# Patient Record
Sex: Female | Born: 1940 | Race: White | Hispanic: No | Smoking: Former smoker
Health system: Southern US, Community
[De-identification: ages and names within clinical notes are randomized; demographics above are authoritative.]

## PROBLEM LIST (undated history)

## (undated) DIAGNOSIS — F32A Depression, unspecified: Secondary | ICD-10-CM

## (undated) DIAGNOSIS — S42001A Fracture of unspecified part of right clavicle, initial encounter for closed fracture: Secondary | ICD-10-CM

## (undated) DIAGNOSIS — H409 Unspecified glaucoma: Secondary | ICD-10-CM

## (undated) DIAGNOSIS — D869 Sarcoidosis, unspecified: Secondary | ICD-10-CM

## (undated) DIAGNOSIS — C801 Malignant (primary) neoplasm, unspecified: Secondary | ICD-10-CM

## (undated) DIAGNOSIS — Z9109 Other allergy status, other than to drugs and biological substances: Secondary | ICD-10-CM

## (undated) DIAGNOSIS — I10 Essential (primary) hypertension: Secondary | ICD-10-CM

## (undated) DIAGNOSIS — T7840XA Allergy, unspecified, initial encounter: Secondary | ICD-10-CM

## (undated) DIAGNOSIS — M1990 Unspecified osteoarthritis, unspecified site: Secondary | ICD-10-CM

## (undated) DIAGNOSIS — E119 Type 2 diabetes mellitus without complications: Secondary | ICD-10-CM

## (undated) DIAGNOSIS — J45909 Unspecified asthma, uncomplicated: Secondary | ICD-10-CM

## (undated) DIAGNOSIS — E785 Hyperlipidemia, unspecified: Secondary | ICD-10-CM

## (undated) DIAGNOSIS — Z87442 Personal history of urinary calculi: Secondary | ICD-10-CM

## (undated) DIAGNOSIS — F419 Anxiety disorder, unspecified: Secondary | ICD-10-CM

## (undated) HISTORY — DX: Unspecified glaucoma: H40.9

## (undated) HISTORY — DX: Hyperlipidemia, unspecified: E78.5

## (undated) HISTORY — DX: Depression, unspecified: F32.A

## (undated) HISTORY — DX: Allergy, unspecified, initial encounter: T78.40XA

## (undated) HISTORY — DX: Anxiety disorder, unspecified: F41.9

## (undated) HISTORY — PX: EYE SURGERY: SHX253

## (undated) HISTORY — PX: BRONCHOSCOPY: SUR163

## (undated) HISTORY — PX: FRACTURE SURGERY: SHX138

## (undated) HISTORY — PX: COLONOSCOPY W/ POLYPECTOMY: SHX1380

---

## 1997-11-15 ENCOUNTER — Other Ambulatory Visit: Admission: RE | Admit: 1997-11-15 | Discharge: 1997-11-15 | Payer: Self-pay | Admitting: Urology

## 1998-02-12 ENCOUNTER — Other Ambulatory Visit: Admission: RE | Admit: 1998-02-12 | Discharge: 1998-02-12 | Payer: Self-pay | Admitting: Urology

## 1998-02-12 ENCOUNTER — Ambulatory Visit (HOSPITAL_COMMUNITY): Admission: RE | Admit: 1998-02-12 | Discharge: 1998-02-12 | Payer: Self-pay | Admitting: Urology

## 1998-08-13 ENCOUNTER — Other Ambulatory Visit: Admission: RE | Admit: 1998-08-13 | Discharge: 1998-08-13 | Payer: Self-pay | Admitting: Urology

## 1999-06-05 ENCOUNTER — Other Ambulatory Visit: Admission: RE | Admit: 1999-06-05 | Discharge: 1999-06-05 | Payer: Self-pay | Admitting: Urology

## 2000-10-22 ENCOUNTER — Other Ambulatory Visit: Admission: RE | Admit: 2000-10-22 | Discharge: 2000-10-22 | Payer: Self-pay | Admitting: Family Medicine

## 2001-04-09 ENCOUNTER — Ambulatory Visit (HOSPITAL_COMMUNITY): Admission: RE | Admit: 2001-04-09 | Discharge: 2001-04-09 | Payer: Self-pay | Admitting: Gastroenterology

## 2001-10-22 ENCOUNTER — Other Ambulatory Visit: Admission: RE | Admit: 2001-10-22 | Discharge: 2001-10-22 | Payer: Self-pay | Admitting: Family Medicine

## 2002-10-25 ENCOUNTER — Other Ambulatory Visit: Admission: RE | Admit: 2002-10-25 | Discharge: 2002-10-25 | Payer: Self-pay | Admitting: Family Medicine

## 2003-10-26 ENCOUNTER — Other Ambulatory Visit: Admission: RE | Admit: 2003-10-26 | Discharge: 2003-10-26 | Payer: Self-pay | Admitting: Family Medicine

## 2003-11-23 ENCOUNTER — Encounter: Admission: RE | Admit: 2003-11-23 | Discharge: 2004-02-21 | Payer: Self-pay | Admitting: Family Medicine

## 2005-01-24 ENCOUNTER — Other Ambulatory Visit: Admission: RE | Admit: 2005-01-24 | Discharge: 2005-01-24 | Payer: Self-pay | Admitting: Family Medicine

## 2012-07-05 ENCOUNTER — Other Ambulatory Visit: Payer: Self-pay | Admitting: Obstetrics and Gynecology

## 2014-07-11 ENCOUNTER — Other Ambulatory Visit: Payer: Self-pay | Admitting: Obstetrics and Gynecology

## 2014-07-13 LAB — CYTOLOGY - PAP

## 2015-10-03 ENCOUNTER — Other Ambulatory Visit: Payer: Self-pay | Admitting: Nephrology

## 2015-10-03 DIAGNOSIS — R7989 Other specified abnormal findings of blood chemistry: Secondary | ICD-10-CM

## 2015-10-04 ENCOUNTER — Ambulatory Visit
Admission: RE | Admit: 2015-10-04 | Discharge: 2015-10-04 | Disposition: A | Discharge status: Home / Self Care | Payer: Medicare Other | Source: Ambulatory Visit | Attending: Nephrology | Admitting: Nephrology

## 2015-10-04 DIAGNOSIS — R7989 Other specified abnormal findings of blood chemistry: Secondary | ICD-10-CM

## 2016-07-24 DIAGNOSIS — H31009 Unspecified chorioretinal scars, unspecified eye: Secondary | ICD-10-CM | POA: Diagnosis not present

## 2016-07-24 DIAGNOSIS — H3562 Retinal hemorrhage, left eye: Secondary | ICD-10-CM | POA: Diagnosis not present

## 2016-07-24 DIAGNOSIS — H401122 Primary open-angle glaucoma, left eye, moderate stage: Secondary | ICD-10-CM | POA: Diagnosis not present

## 2016-07-24 DIAGNOSIS — D8689 Sarcoidosis of other sites: Secondary | ICD-10-CM | POA: Diagnosis not present

## 2016-07-30 DIAGNOSIS — H401122 Primary open-angle glaucoma, left eye, moderate stage: Secondary | ICD-10-CM | POA: Diagnosis not present

## 2016-08-05 DIAGNOSIS — Z6824 Body mass index (BMI) 24.0-24.9, adult: Secondary | ICD-10-CM | POA: Diagnosis not present

## 2016-08-05 DIAGNOSIS — Z130 Encounter for screening for diseases of the blood and blood-forming organs and certain disorders involving the immune mechanism: Secondary | ICD-10-CM | POA: Diagnosis not present

## 2016-08-05 DIAGNOSIS — Z01419 Encounter for gynecological examination (general) (routine) without abnormal findings: Secondary | ICD-10-CM | POA: Diagnosis not present

## 2016-08-05 DIAGNOSIS — Z136 Encounter for screening for cardiovascular disorders: Secondary | ICD-10-CM | POA: Diagnosis not present

## 2016-08-05 DIAGNOSIS — Z124 Encounter for screening for malignant neoplasm of cervix: Secondary | ICD-10-CM | POA: Diagnosis not present

## 2016-08-05 DIAGNOSIS — E559 Vitamin D deficiency, unspecified: Secondary | ICD-10-CM | POA: Diagnosis not present

## 2016-08-05 DIAGNOSIS — E1165 Type 2 diabetes mellitus with hyperglycemia: Secondary | ICD-10-CM | POA: Diagnosis not present

## 2016-10-02 DIAGNOSIS — H524 Presbyopia: Secondary | ICD-10-CM | POA: Diagnosis not present

## 2016-10-02 DIAGNOSIS — E119 Type 2 diabetes mellitus without complications: Secondary | ICD-10-CM | POA: Diagnosis not present

## 2016-10-02 DIAGNOSIS — H5203 Hypermetropia, bilateral: Secondary | ICD-10-CM | POA: Diagnosis not present

## 2016-10-02 DIAGNOSIS — Z961 Presence of intraocular lens: Secondary | ICD-10-CM | POA: Diagnosis not present

## 2017-01-15 DIAGNOSIS — H18002 Unspecified corneal deposit, left eye: Secondary | ICD-10-CM | POA: Diagnosis not present

## 2017-01-15 DIAGNOSIS — H3562 Retinal hemorrhage, left eye: Secondary | ICD-10-CM | POA: Diagnosis not present

## 2017-01-15 DIAGNOSIS — H401122 Primary open-angle glaucoma, left eye, moderate stage: Secondary | ICD-10-CM | POA: Diagnosis not present

## 2017-01-15 DIAGNOSIS — H2012 Chronic iridocyclitis, left eye: Secondary | ICD-10-CM | POA: Diagnosis not present

## 2017-01-23 DIAGNOSIS — H3562 Retinal hemorrhage, left eye: Secondary | ICD-10-CM | POA: Diagnosis not present

## 2017-01-23 DIAGNOSIS — H18002 Unspecified corneal deposit, left eye: Secondary | ICD-10-CM | POA: Diagnosis not present

## 2017-01-23 DIAGNOSIS — H401122 Primary open-angle glaucoma, left eye, moderate stage: Secondary | ICD-10-CM | POA: Diagnosis not present

## 2017-01-23 DIAGNOSIS — H2012 Chronic iridocyclitis, left eye: Secondary | ICD-10-CM | POA: Diagnosis not present

## 2017-02-02 DIAGNOSIS — E119 Type 2 diabetes mellitus without complications: Secondary | ICD-10-CM | POA: Diagnosis not present

## 2017-02-10 DIAGNOSIS — H18002 Unspecified corneal deposit, left eye: Secondary | ICD-10-CM | POA: Diagnosis not present

## 2017-02-10 DIAGNOSIS — H3562 Retinal hemorrhage, left eye: Secondary | ICD-10-CM | POA: Diagnosis not present

## 2017-02-10 DIAGNOSIS — H2012 Chronic iridocyclitis, left eye: Secondary | ICD-10-CM | POA: Diagnosis not present

## 2017-02-10 DIAGNOSIS — H401122 Primary open-angle glaucoma, left eye, moderate stage: Secondary | ICD-10-CM | POA: Diagnosis not present

## 2017-03-03 DIAGNOSIS — H401122 Primary open-angle glaucoma, left eye, moderate stage: Secondary | ICD-10-CM | POA: Diagnosis not present

## 2017-03-03 DIAGNOSIS — H18002 Unspecified corneal deposit, left eye: Secondary | ICD-10-CM | POA: Diagnosis not present

## 2017-03-03 DIAGNOSIS — H3562 Retinal hemorrhage, left eye: Secondary | ICD-10-CM | POA: Diagnosis not present

## 2017-03-03 DIAGNOSIS — H2012 Chronic iridocyclitis, left eye: Secondary | ICD-10-CM | POA: Diagnosis not present

## 2017-03-04 DIAGNOSIS — Z1231 Encounter for screening mammogram for malignant neoplasm of breast: Secondary | ICD-10-CM | POA: Diagnosis not present

## 2017-06-04 DIAGNOSIS — H35352 Cystoid macular degeneration, left eye: Secondary | ICD-10-CM | POA: Diagnosis not present

## 2017-06-04 DIAGNOSIS — H18002 Unspecified corneal deposit, left eye: Secondary | ICD-10-CM | POA: Diagnosis not present

## 2017-06-04 DIAGNOSIS — H2012 Chronic iridocyclitis, left eye: Secondary | ICD-10-CM | POA: Diagnosis not present

## 2017-06-04 DIAGNOSIS — H401122 Primary open-angle glaucoma, left eye, moderate stage: Secondary | ICD-10-CM | POA: Diagnosis not present

## 2017-07-27 DIAGNOSIS — I129 Hypertensive chronic kidney disease with stage 1 through stage 4 chronic kidney disease, or unspecified chronic kidney disease: Secondary | ICD-10-CM | POA: Diagnosis not present

## 2017-07-27 DIAGNOSIS — E1122 Type 2 diabetes mellitus with diabetic chronic kidney disease: Secondary | ICD-10-CM | POA: Diagnosis not present

## 2017-07-27 DIAGNOSIS — E785 Hyperlipidemia, unspecified: Secondary | ICD-10-CM | POA: Diagnosis not present

## 2017-07-27 DIAGNOSIS — N183 Chronic kidney disease, stage 3 (moderate): Secondary | ICD-10-CM | POA: Diagnosis not present

## 2017-09-03 DIAGNOSIS — H401122 Primary open-angle glaucoma, left eye, moderate stage: Secondary | ICD-10-CM | POA: Diagnosis not present

## 2017-09-03 DIAGNOSIS — H18002 Unspecified corneal deposit, left eye: Secondary | ICD-10-CM | POA: Diagnosis not present

## 2017-09-03 DIAGNOSIS — H2012 Chronic iridocyclitis, left eye: Secondary | ICD-10-CM | POA: Diagnosis not present

## 2017-09-03 DIAGNOSIS — H401111 Primary open-angle glaucoma, right eye, mild stage: Secondary | ICD-10-CM | POA: Diagnosis not present

## 2017-10-07 DIAGNOSIS — Z01419 Encounter for gynecological examination (general) (routine) without abnormal findings: Secondary | ICD-10-CM | POA: Diagnosis not present

## 2017-10-07 DIAGNOSIS — Z6824 Body mass index (BMI) 24.0-24.9, adult: Secondary | ICD-10-CM | POA: Diagnosis not present

## 2017-11-09 DIAGNOSIS — H401111 Primary open-angle glaucoma, right eye, mild stage: Secondary | ICD-10-CM | POA: Diagnosis not present

## 2017-11-09 DIAGNOSIS — H4052X4 Glaucoma secondary to other eye disorders, left eye, indeterminate stage: Secondary | ICD-10-CM | POA: Diagnosis not present

## 2017-11-26 DIAGNOSIS — H2012 Chronic iridocyclitis, left eye: Secondary | ICD-10-CM | POA: Diagnosis not present

## 2017-11-26 DIAGNOSIS — H401111 Primary open-angle glaucoma, right eye, mild stage: Secondary | ICD-10-CM | POA: Diagnosis not present

## 2017-11-26 DIAGNOSIS — H401122 Primary open-angle glaucoma, left eye, moderate stage: Secondary | ICD-10-CM | POA: Diagnosis not present

## 2017-11-26 DIAGNOSIS — H18002 Unspecified corneal deposit, left eye: Secondary | ICD-10-CM | POA: Diagnosis not present

## 2017-12-01 DIAGNOSIS — H4052X3 Glaucoma secondary to other eye disorders, left eye, severe stage: Secondary | ICD-10-CM | POA: Diagnosis not present

## 2017-12-01 DIAGNOSIS — H401111 Primary open-angle glaucoma, right eye, mild stage: Secondary | ICD-10-CM | POA: Diagnosis not present

## 2017-12-22 DIAGNOSIS — H4052X4 Glaucoma secondary to other eye disorders, left eye, indeterminate stage: Secondary | ICD-10-CM | POA: Diagnosis not present

## 2017-12-22 DIAGNOSIS — H401111 Primary open-angle glaucoma, right eye, mild stage: Secondary | ICD-10-CM | POA: Diagnosis not present

## 2018-01-11 DIAGNOSIS — H4052X4 Glaucoma secondary to other eye disorders, left eye, indeterminate stage: Secondary | ICD-10-CM | POA: Diagnosis not present

## 2018-01-11 DIAGNOSIS — H401111 Primary open-angle glaucoma, right eye, mild stage: Secondary | ICD-10-CM | POA: Diagnosis not present

## 2018-03-05 DIAGNOSIS — Z1231 Encounter for screening mammogram for malignant neoplasm of breast: Secondary | ICD-10-CM | POA: Diagnosis not present

## 2018-03-30 DIAGNOSIS — E785 Hyperlipidemia, unspecified: Secondary | ICD-10-CM | POA: Diagnosis not present

## 2018-04-06 DIAGNOSIS — H18002 Unspecified corneal deposit, left eye: Secondary | ICD-10-CM | POA: Diagnosis not present

## 2018-04-06 DIAGNOSIS — H2012 Chronic iridocyclitis, left eye: Secondary | ICD-10-CM | POA: Diagnosis not present

## 2018-04-06 DIAGNOSIS — H401111 Primary open-angle glaucoma, right eye, mild stage: Secondary | ICD-10-CM | POA: Diagnosis not present

## 2018-04-06 DIAGNOSIS — H401123 Primary open-angle glaucoma, left eye, severe stage: Secondary | ICD-10-CM | POA: Diagnosis not present

## 2018-04-12 DIAGNOSIS — H4052X3 Glaucoma secondary to other eye disorders, left eye, severe stage: Secondary | ICD-10-CM | POA: Diagnosis not present

## 2018-04-12 DIAGNOSIS — H401111 Primary open-angle glaucoma, right eye, mild stage: Secondary | ICD-10-CM | POA: Diagnosis not present

## 2018-05-03 DIAGNOSIS — D124 Benign neoplasm of descending colon: Secondary | ICD-10-CM | POA: Diagnosis not present

## 2018-05-03 DIAGNOSIS — Z86010 Personal history of colonic polyps: Secondary | ICD-10-CM | POA: Diagnosis not present

## 2018-05-05 DIAGNOSIS — D124 Benign neoplasm of descending colon: Secondary | ICD-10-CM | POA: Diagnosis not present

## 2018-09-16 DIAGNOSIS — H401123 Primary open-angle glaucoma, left eye, severe stage: Secondary | ICD-10-CM | POA: Diagnosis not present

## 2018-09-16 DIAGNOSIS — H2012 Chronic iridocyclitis, left eye: Secondary | ICD-10-CM | POA: Diagnosis not present

## 2018-09-16 DIAGNOSIS — H18002 Unspecified corneal deposit, left eye: Secondary | ICD-10-CM | POA: Diagnosis not present

## 2018-09-16 DIAGNOSIS — H401111 Primary open-angle glaucoma, right eye, mild stage: Secondary | ICD-10-CM | POA: Diagnosis not present

## 2018-10-20 DIAGNOSIS — Z01419 Encounter for gynecological examination (general) (routine) without abnormal findings: Secondary | ICD-10-CM | POA: Diagnosis not present

## 2018-10-20 DIAGNOSIS — Z6823 Body mass index (BMI) 23.0-23.9, adult: Secondary | ICD-10-CM | POA: Diagnosis not present

## 2018-11-15 DIAGNOSIS — H401111 Primary open-angle glaucoma, right eye, mild stage: Secondary | ICD-10-CM | POA: Diagnosis not present

## 2018-11-15 DIAGNOSIS — H4052X3 Glaucoma secondary to other eye disorders, left eye, severe stage: Secondary | ICD-10-CM | POA: Diagnosis not present

## 2018-12-13 DIAGNOSIS — H4052X3 Glaucoma secondary to other eye disorders, left eye, severe stage: Secondary | ICD-10-CM | POA: Diagnosis not present

## 2018-12-13 DIAGNOSIS — H401111 Primary open-angle glaucoma, right eye, mild stage: Secondary | ICD-10-CM | POA: Diagnosis not present

## 2018-12-23 DIAGNOSIS — D869 Sarcoidosis, unspecified: Secondary | ICD-10-CM | POA: Diagnosis not present

## 2018-12-23 DIAGNOSIS — H18002 Unspecified corneal deposit, left eye: Secondary | ICD-10-CM | POA: Diagnosis not present

## 2018-12-23 DIAGNOSIS — H401123 Primary open-angle glaucoma, left eye, severe stage: Secondary | ICD-10-CM | POA: Diagnosis not present

## 2018-12-23 DIAGNOSIS — H31009 Unspecified chorioretinal scars, unspecified eye: Secondary | ICD-10-CM | POA: Diagnosis not present

## 2019-02-28 DIAGNOSIS — H401111 Primary open-angle glaucoma, right eye, mild stage: Secondary | ICD-10-CM | POA: Diagnosis not present

## 2019-02-28 DIAGNOSIS — H4052X3 Glaucoma secondary to other eye disorders, left eye, severe stage: Secondary | ICD-10-CM | POA: Diagnosis not present

## 2019-03-18 DIAGNOSIS — Z1231 Encounter for screening mammogram for malignant neoplasm of breast: Secondary | ICD-10-CM | POA: Diagnosis not present

## 2019-05-16 DIAGNOSIS — R899 Unspecified abnormal finding in specimens from other organs, systems and tissues: Secondary | ICD-10-CM | POA: Diagnosis not present

## 2019-05-16 DIAGNOSIS — E7800 Pure hypercholesterolemia, unspecified: Secondary | ICD-10-CM | POA: Diagnosis not present

## 2019-05-16 DIAGNOSIS — E119 Type 2 diabetes mellitus without complications: Secondary | ICD-10-CM | POA: Diagnosis not present

## 2019-05-26 DIAGNOSIS — H35352 Cystoid macular degeneration, left eye: Secondary | ICD-10-CM | POA: Diagnosis not present

## 2019-05-26 DIAGNOSIS — H18002 Unspecified corneal deposit, left eye: Secondary | ICD-10-CM | POA: Diagnosis not present

## 2019-05-26 DIAGNOSIS — H401123 Primary open-angle glaucoma, left eye, severe stage: Secondary | ICD-10-CM | POA: Diagnosis not present

## 2019-05-26 DIAGNOSIS — H401111 Primary open-angle glaucoma, right eye, mild stage: Secondary | ICD-10-CM | POA: Diagnosis not present

## 2019-05-30 DIAGNOSIS — R7989 Other specified abnormal findings of blood chemistry: Secondary | ICD-10-CM | POA: Diagnosis not present

## 2019-06-29 ENCOUNTER — Encounter (HOSPITAL_COMMUNITY): Payer: Self-pay | Admitting: Emergency Medicine

## 2019-06-29 ENCOUNTER — Other Ambulatory Visit: Payer: Self-pay

## 2019-06-29 ENCOUNTER — Emergency Department (HOSPITAL_COMMUNITY)
Admission: EM | Admit: 2019-06-29 | Discharge: 2019-06-29 | Disposition: A | Discharge status: Home / Self Care | Payer: Medicare Other | Attending: Emergency Medicine | Admitting: Emergency Medicine

## 2019-06-29 ENCOUNTER — Emergency Department (HOSPITAL_COMMUNITY): Payer: Medicare Other

## 2019-06-29 DIAGNOSIS — Y999 Unspecified external cause status: Secondary | ICD-10-CM | POA: Diagnosis not present

## 2019-06-29 DIAGNOSIS — W1830XA Fall on same level, unspecified, initial encounter: Secondary | ICD-10-CM | POA: Insufficient documentation

## 2019-06-29 DIAGNOSIS — S42024A Nondisplaced fracture of shaft of right clavicle, initial encounter for closed fracture: Secondary | ICD-10-CM | POA: Insufficient documentation

## 2019-06-29 DIAGNOSIS — W19XXXA Unspecified fall, initial encounter: Secondary | ICD-10-CM

## 2019-06-29 DIAGNOSIS — Y92012 Bathroom of single-family (private) house as the place of occurrence of the external cause: Secondary | ICD-10-CM | POA: Insufficient documentation

## 2019-06-29 DIAGNOSIS — S0181XA Laceration without foreign body of other part of head, initial encounter: Secondary | ICD-10-CM | POA: Insufficient documentation

## 2019-06-29 DIAGNOSIS — Y9389 Activity, other specified: Secondary | ICD-10-CM | POA: Insufficient documentation

## 2019-06-29 LAB — CBC
HCT: 45.2 % (ref 36.0–46.0)
Hemoglobin: 14.5 g/dL (ref 12.0–15.0)
MCH: 31.5 pg (ref 26.0–34.0)
MCHC: 32.1 g/dL (ref 30.0–36.0)
MCV: 98.3 fL (ref 80.0–100.0)
Platelets: 292 10*3/uL (ref 150–400)
RBC: 4.6 MIL/uL (ref 3.87–5.11)
RDW: 11.9 % (ref 11.5–15.5)
WBC: 14.1 10*3/uL — ABNORMAL HIGH (ref 4.0–10.5)
nRBC: 0 % (ref 0.0–0.2)

## 2019-06-29 LAB — BASIC METABOLIC PANEL
Anion gap: 10 (ref 5–15)
BUN: 29 mg/dL — ABNORMAL HIGH (ref 8–23)
CO2: 21 mmol/L — ABNORMAL LOW (ref 22–32)
Calcium: 9.9 mg/dL (ref 8.9–10.3)
Chloride: 107 mmol/L (ref 98–111)
Creatinine, Ser: 0.71 mg/dL (ref 0.44–1.00)
GFR calc Af Amer: >60 mL/min (ref >60)
GFR calc non Af Amer: >60 mL/min (ref >60)
Glucose, Bld: 149 mg/dL — ABNORMAL HIGH (ref 70–99)
Potassium: 4.6 mmol/L (ref 3.5–5.1)
Sodium: 138 mmol/L (ref 135–145)

## 2019-06-29 MED ORDER — HYDROCODONE-ACETAMINOPHEN 5-325 MG PO TABS: 1.0000 | ORAL_TABLET | Freq: Four times a day (QID) | ORAL | 0 refills | Status: DC | PRN

## 2019-06-29 MED ORDER — LIDOCAINE-EPINEPHRINE (PF) 2 %-1:200000 IJ SOLN
10.0000 mL | Freq: Once | INTRAMUSCULAR | Status: AC
  Administered 2019-06-29: 10 mL
  Filled 2019-06-29: qty 20

## 2019-06-29 NOTE — ED Notes (Signed)
Pt ambulated to the bathroom without difficulty, assisted patient in bathroom with clothes as she could not bend

## 2019-06-29 NOTE — ED Provider Notes (Signed)
[facility] EMERGENCY DEPARTMENT Provider Note   CSN: [id number] Arrival date & time: 06/29/19  1341     History Chief Complaint  Patient presents with  . Fall  . Shoulder Injury    Sabrina Eaton is a 79 y.o. female.  Patient is a 79 year old female presenting with complaints of fall.  Patient with into the bathroom this afternoon where she slipped and fell.  She hit her head on the floor and injured her right shoulder.  She denies any loss of consciousness or headache.  She denies any neck or back pain.  The history is provided by the patient.  Fall This is a new problem. The current episode started 1 to 2 hours ago. The problem occurs constantly. The problem has not changed since onset.Nothing aggravates the symptoms. She has tried nothing for the symptoms.       History reviewed. No pertinent past medical history.  There are no problems to display for this patient.   History reviewed. No pertinent surgical history.   OB History   No obstetric history on file.     History reviewed. No pertinent family history.  Social History   Tobacco Use  . Smoking status: Not on file  Substance Use Topics  . Alcohol use: Not on file  . Drug use: Not on file    Home Medications Prior to Admission medications   Not on File    Allergies    Patient has no known allergies.  Review of Systems   Review of Systems  All other systems reviewed and are negative.   Physical Exam Updated Vital Signs BP (!) 178/66   Pulse 81   Temp 98 F (36.7 C) (Oral)   Resp 16   Wt 59 kg   SpO2 99%   Physical Exam Vitals and nursing note reviewed.  Constitutional:      General: She is not in acute distress.    Appearance: She is well-developed. She is not diaphoretic.  HENT:     Head: Normocephalic.     Comments: There is a 1.5 cm laceration to the lateral right supraorbital ridge.  Bleeding is controlled. Eyes:     Extraocular Movements: Extraocular  movements intact.     Pupils: Pupils are equal, round, and reactive to light.  Cardiovascular:     Rate and Rhythm: Normal rate and regular rhythm.     Heart sounds: No murmur. No friction rub. No gallop.   Pulmonary:     Effort: Pulmonary effort is normal. No respiratory distress.     Breath sounds: Normal breath sounds. No wheezing.  Abdominal:     General: Bowel sounds are normal. There is no distension.     Palpations: Abdomen is soft.     Tenderness: There is no abdominal tenderness.  Musculoskeletal:        General: Normal range of motion.     Cervical back: Normal range of motion and neck supple.  Skin:    General: Skin is warm and dry.  Neurological:     General: No focal deficit present.     Mental Status: She is alert and oriented to person, place, and time.     Cranial Nerves: No cranial nerve deficit.     Motor: No weakness.     Coordination: Coordination normal.     ED Results / Procedures / Treatments   Labs (all labs ordered are listed, but only abnormal results are displayed) Labs Reviewed  CBC -  Abnormal; Notable for the following components:      Result Value   WBC 14.1 (*)    All other components within normal limits  BASIC METABOLIC PANEL - Abnormal; Notable for the following components:   CO2 21 (*)    Glucose, Bld 149 (*)    BUN 29 (*)    All other components within normal limits    EKG None  Radiology DG Shoulder Right  Result Date: 06/29/2019 CLINICAL DATA:  Fall. Pt was bending over to pick something up at home when she fell landing on her right side. Pt has pain in the anterior and posterior sides of her right shoulder. Pt said her pain is not constant but describes it as "very unc.*comment was truncated* EXAM: RIGHT SHOULDER - 2+ VIEW COMPARISON:  None. FINDINGS: Glenohumeral joint is intact. No evidence of scapular fracture or humeral fracture. The acromioclavicular joint is intact. IMPRESSION: No fracture or dislocation. Electronically Signed    By: Suzy Bouchard M.D.   On: 06/29/2019 14:59    Procedures Procedures (including critical care time)  Medications Ordered in ED Medications - No data to display  ED Course  I have reviewed the triage vital signs and the nursing notes.  Pertinent labs & imaging results that were available during my care of the patient were reviewed by me and considered in my medical decision making (see chart for details).    MDM Rules/Calculators/A&P  Patient presents here after a slip and fall in the bathroom floor.  She is complaining of pain in her shoulder and has a laceration to the right supraorbital ridge.  Patient will be placed in an arm sling and swath and will follow up with orthopedics in the next week.  The laceration was repaired as per Merrily Pew Geiple's note.  Patient to ice, rest, perform local wound care, and follow-up with orthopedics.  To return as needed for any problems.  Patient does have a laceration to the supraorbital ridge, however has no headache, experienced no loss of consciousness, and is neurologically intact.  Patient takes no blood thinners and I do not feel as though any imaging of her head is indicated.  Her cervical spine is also nontender and has full range of motion in all directions.  This was cleared clinically.  Final Clinical Impression(s) / ED Diagnoses Final diagnoses:  Fall    Rx / DC Orders ED Discharge Orders    None       Veryl Speak, MD 06/29/19 1904

## 2019-06-29 NOTE — ED Triage Notes (Addendum)
Pt arrives to ED from home with complaints of a fall today while at home. Patient states that when she feel she was bending over to pick something up and fell. Patient denies a syncopal episode or LOC. Patient does have laceration to right sided of forehead and possible deformity to right collarbone.

## 2019-06-29 NOTE — ED Provider Notes (Signed)
..  Laceration Repair  Date/Time: 06/29/2019 7:02 PM Performed by: Carlisle Cater, PA-C Authorized by: Carlisle Cater, PA-C   Consent:    Consent obtained:  Verbal   Consent given by:  Patient   Risks discussed:  Infection, pain and poor wound healing   Alternatives discussed:  No treatment Anesthesia (see MAR for exact dosages):    Anesthesia method:  Local infiltration   Local anesthetic:  Lidocaine 2% WITH epi Laceration details:    Location:  Face   Face location:  Forehead   Length (cm):  1.5 Pre-procedure details:    Preparation:  Patient was prepped and draped in usual sterile fashion Exploration:    Wound exploration: wound explored through full range of motion and entire depth of wound probed and visualized     Contaminated: no   Treatment:    Area cleansed with:  Shur-Clens   Amount of cleaning:  Standard Skin repair:    Repair method:  Sutures   Suture size:  5-0   Suture material:  Prolene   Suture technique:  Simple interrupted   Number of sutures:  3 Approximation:    Approximation:  Close Post-procedure details:    Dressing:  Open (no dressing)   Patient tolerance of procedure:  Tolerated well, no immediate complications      Carlisle Cater, PA-C 06/29/19 1904    Veryl Speak, MD 06/29/19 [phone]

## 2019-06-29 NOTE — ED Notes (Signed)
Patient verbalizes understanding of discharge instructions. Opportunity for questioning and answers were provided. Armband removed by staff, pt discharged from ED ambulatory. Pt unable to sign esignature pad due to writing hand in sling

## 2019-06-29 NOTE — Discharge Instructions (Addendum)
Wear sling and swath as applied today at all times except when showering until followed up by orthopedics.  Ice for 20 minutes every 2 hours while awake for the next 2 days.  Follow-up with orthopedic surgery in 1 week.  The contact information for Dr. Stann Mainland has been provided in this discharge summary for you to call and make these arrangements.  Local wound care with bacitracin and Band-Aid changes twice daily.  Your sutures are to be removed in 1 week.  Please follow-up with your primary doctor for this.

## 2019-07-05 DIAGNOSIS — M25511 Pain in right shoulder: Secondary | ICD-10-CM | POA: Diagnosis not present

## 2019-07-05 DIAGNOSIS — Z4789 Encounter for other orthopedic aftercare: Secondary | ICD-10-CM | POA: Insufficient documentation

## 2019-07-05 DIAGNOSIS — S42021A Displaced fracture of shaft of right clavicle, initial encounter for closed fracture: Secondary | ICD-10-CM | POA: Diagnosis not present

## 2019-07-08 ENCOUNTER — Other Ambulatory Visit (HOSPITAL_COMMUNITY): Payer: Medicare Other

## 2019-07-08 ENCOUNTER — Other Ambulatory Visit (HOSPITAL_COMMUNITY)
Admission: RE | Admit: 2019-07-08 | Discharge: 2019-07-08 | Disposition: A | Discharge status: Home / Self Care | Payer: Medicare Other | Source: Ambulatory Visit | Attending: Orthopedic Surgery | Admitting: Orthopedic Surgery

## 2019-07-08 ENCOUNTER — Inpatient Hospital Stay (HOSPITAL_COMMUNITY)
Admission: RE | Admit: 2019-07-08 | Discharge: 2019-07-08 | Disposition: A | Discharge status: Home / Self Care | Payer: Medicare Other | Source: Ambulatory Visit

## 2019-07-08 ENCOUNTER — Encounter (HOSPITAL_COMMUNITY): Payer: Self-pay | Admitting: Orthopedic Surgery

## 2019-07-08 ENCOUNTER — Encounter (HOSPITAL_COMMUNITY): Payer: Self-pay

## 2019-07-08 ENCOUNTER — Other Ambulatory Visit: Payer: Self-pay

## 2019-07-08 DIAGNOSIS — Z01812 Encounter for preprocedural laboratory examination: Secondary | ICD-10-CM | POA: Insufficient documentation

## 2019-07-08 DIAGNOSIS — Z20822 Contact with and (suspected) exposure to covid-19: Secondary | ICD-10-CM | POA: Insufficient documentation

## 2019-07-08 HISTORY — DX: Unspecified asthma, uncomplicated: J45.909

## 2019-07-08 HISTORY — DX: Other allergy status, other than to drugs and biological substances: Z91.09

## 2019-07-08 HISTORY — DX: Sarcoidosis, unspecified: D86.9

## 2019-07-08 LAB — SARS CORONAVIRUS 2 (TAT 6-24 HRS): SARS Coronavirus 2: NEGATIVE

## 2019-07-08 NOTE — Pre-Procedure Instructions (Addendum)
Sabrina Eaton  07/08/2019    Your procedure is scheduled on Tuesday, March 16  Report to [facility], Main Entrance or Entrance "A" at  10:30 AM        Your surgery or procedure is scheduled for 12:30 PM   Call this number if you have problems the morning of surgery: [phone]  This is the number for the Pre- Surgical Desk.           For any other questions, please call [phone], Monday - Friday 8 AM - 4 PM.  If you smoke do not smoke within 24 hours of surgery   Remember:  Do not eat  after midnight, Monday, March 15.  You may drink clear liquids until 9:30 AM   Clear liquids allowed are:   Water, Juice (non-citric and without pulp), Carbonated beverages, Clear Tea, Black Coffee only, Plain Jell-O only, Gatorade and Plain Popsicles only    Drink 8 ounces of water between 9 :00 AM and  9:30 AM                Do not drink any liquids after 9:30 AM   Take these medicines the morning of surgery with A SIP OF WATER :  citalopram (CELEXA)             metoprolol tartrate (LOPRESSOR)              Use eye drops  If needed: you may take HYDROcodone-acetaminophen (NORCO) or acetaminophen (TYLENOL)   STOP taking Aspirin, Aspirin Products (Goody Powder, Excedrin Migraine), Ibuprofen (Advil), Naproxen (Aleve), Vitamins and Herbal Products (ie Fish Oil).  WHAT DO I DO ABOUT MY DIABETES MEDICATION?   Do not take metFORMIN (GLUCOPHAGE) the morning of surgery  How to Manage Your Diabetes Before and After Surgery  Why is it important to control my blood sugar before and after surgery? . Improving blood sugar levels before and after surgery helps healing and can limit problems. . A way of improving blood sugar control is eating a healthy diet by: o  Eating less sugar and carbohydrates o  Increasing activity/exercise o  Talking with your doctor about reaching your blood sugar goals . High blood sugars (greater than 180 mg/dL) can raise your risk of infections and slow your  recovery, so you will need to focus on controlling your diabetes during the weeks before surgery. . Make sure that the doctor who takes care of your diabetes knows about your planned surgery including the date and location.  How do I manage my blood sugar before surgery? . Check your blood sugar at least 4 times a day, starting 2 days before surgery, to make sure that the level is not too high or low. o Check your blood sugar the morning of your surgery when you wake up and every 2 hours until you get to the Short Stay unit. . If your blood sugar is less than 70 mg/dL, you will need to treat for low blood sugar: o Do not take insulin. o Treat a low blood sugar (less than 70 mg/dL) with  cup of clear juice (cranberry or apple), 4 glucose tablets, OR glucose gel. Recheck blood sugar in 15 minutes after treatment (to make sure it is greater than 70 mg/dL). If your blood sugar is not greater than 70 mg/dL on recheck, call [phone] o  for further instructions. . Report your blood sugar to the short stay nurse when you get to Short Stay.  Marland Kitchen  If you are admitted to the hospital after surgery: o Your blood sugar will be checked by the staff and you will probably be given insulin after surgery (instead of oral diabetes medicines) to make sure you have good blood sugar levels. o The goal for blood sugar control after surgery is 80-180 mg/dL.   [facility]- Preparing For Surgery  Before surgery, you can play an important role. Because skin is not sterile, your skin needs to be as free of germs as possible. You can reduce the number of germs on your skin by washing with CHG (chlorahexidine gluconate) Soap before surgery.  CHG is an antiseptic cleaner which kills germs and bonds with the skin to continue killing germs even after washing.    Oral Hygiene is also important to reduce your risk of infection.  Remember - BRUSH YOUR TEETH THE MORNING OF SURGERY WITH YOUR REGULAR TOOTHPASTE  Please do not  use if you have an allergy to CHG or antibacterial soaps. If your skin becomes reddened/irritated stop using the CHG.  Do not shave (including legs and underarms) for at least 48 hours prior to first CHG shower. It is OK to shave your face.  Please follow these instructions carefully.  Shower the NIGHT BEFORE SURGERY and the MORNING OF SURGERY with CHG.   1. If you chose to wash your hair, wash your hair first as usual with your normal shampoo.  2. After you shampoo, [facility], Main Entrance or Entrance "A"  3. Use CHG as you would any other liquid soap. You can apply CHG directly to the skin and wash gently with a scrungie or a clean washcloth.   4. Apply the CHG Soap to your body ONLY FROM THE NECK DOWN.  Do not use on open wounds or open sores. Avoid contact with your eyes, ears, mouth and genitals (private parts).   5. Wash thoroughly, paying special attention to the area where your surgery will be performed.  6. Thoroughly rinse your body with warm water from the neck down.  7. DO NOT shower/wash with your normal soap after using and rinsing off the CHG Soap.  8. Pat yourself dry with a CLEAN TOWEL.  9. Wear CLEAN PAJAMAS to bed the night before surgery, wear comfortable clothes the morning of surgery  10. Place CLEAN SHEETS on your bed the night of your first shower and DO NOT SLEEP WITH PETS.  Day of Surgery: Shower as instructed above. Do not apply any deodorants/lotions, powders or colognes.  Please wear clean clothes to the hospital/surgery center.   Remember to brush your teeth WITH YOUR REGULAR TOOTHPASTE        Do not shave 48 hours prior to surgery.          Do not bring valuables to the hospital.        [facility] is not responsible for any belongings or valuables.  Contacts, dentures or bridgework may not be worn into surgery.  Leave your suitcase in the car.  After surgery it may be brought to your room.  For patients admitted to the hospital,  discharge time will be determined by your treatment team.  Patients discharged the day of surgery will not be allowed to drive home.   Please read over the following fact sheets that you were given. Coughing and Deep Breathing, Pain Booklet and Surgical Site Infections

## 2019-07-08 NOTE — Progress Notes (Addendum)
Sabrina Eaton denies chest pain or shortness of breath. Patient denies any s/s of Covid and has not been around anyone that she knows of that has any symptoms or tested positive.  Patient will have Covid test today and knows to be in quarantine until the morning of surgery.  Sabrina Eaton has type II diabetes. Patient checks CBG occassionally.  A1C on 05/26/2019 was 5.5.  I instructed patient to not take Metformin the morning of surgery. I instructed patient to check CBG after awaking.  I Instructed patient if CBG is less than 70 to drink 1/2 cup of a clear juice. Recheck CBG in 15 minutes. If it is still under 70, call Pre- op desk for further instructions.  I instructed patient to not eat after midnight. I informed patient that she could have clear liquids until 0930. I went over what clear liquids consist of; patient will drink water or Cystal Light.  Sabrina Eaton's PCP is Dr. Dian Queen.  Patient does not have a cardiologist.  Sabrina Eaton is going to spend 1 night in the hospital. Patient wanted to bring he medications in, I told her that the hospital does not want patient's to bring home medications to the hospital with them, it is a safety risk. Sabrina Eaton said she does not want any home medications ordered, she does not want to pay for medications she has at home.  I sent a staff message to Dr. Stann Mainland.  Sabrina Eaton was scheduled for a PAT appointment, I was able to complete the interview with a call .  Patient has labs on 06/29/2019 and an EKG on 06/29/2019

## 2019-07-12 ENCOUNTER — Inpatient Hospital Stay (HOSPITAL_COMMUNITY)
Admission: RE | Admit: 2019-07-12 | Discharge: 2019-07-12 | DRG: 517 | Disposition: A | Discharge status: Home / Self Care | Payer: Medicare Other | Attending: Orthopedic Surgery | Admitting: Orthopedic Surgery

## 2019-07-12 ENCOUNTER — Encounter (HOSPITAL_COMMUNITY)
Admission: RE | Disposition: A | Discharge status: Home / Self Care | Payer: Self-pay | Source: Home / Self Care | Attending: Orthopedic Surgery

## 2019-07-12 ENCOUNTER — Inpatient Hospital Stay (HOSPITAL_COMMUNITY): Payer: Medicare Other

## 2019-07-12 ENCOUNTER — Encounter (HOSPITAL_COMMUNITY): Payer: Self-pay | Admitting: Orthopedic Surgery

## 2019-07-12 ENCOUNTER — Inpatient Hospital Stay (HOSPITAL_COMMUNITY): Payer: Medicare Other | Admitting: Anesthesiology

## 2019-07-12 ENCOUNTER — Other Ambulatory Visit: Payer: Self-pay

## 2019-07-12 DIAGNOSIS — I10 Essential (primary) hypertension: Secondary | ICD-10-CM | POA: Diagnosis present

## 2019-07-12 DIAGNOSIS — E119 Type 2 diabetes mellitus without complications: Secondary | ICD-10-CM | POA: Diagnosis not present

## 2019-07-12 DIAGNOSIS — Z20822 Contact with and (suspected) exposure to covid-19: Secondary | ICD-10-CM | POA: Diagnosis present

## 2019-07-12 DIAGNOSIS — M1990 Unspecified osteoarthritis, unspecified site: Secondary | ICD-10-CM | POA: Diagnosis present

## 2019-07-12 DIAGNOSIS — Z7952 Long term (current) use of systemic steroids: Secondary | ICD-10-CM | POA: Diagnosis not present

## 2019-07-12 DIAGNOSIS — Z7984 Long term (current) use of oral hypoglycemic drugs: Secondary | ICD-10-CM | POA: Diagnosis not present

## 2019-07-12 DIAGNOSIS — Y92009 Unspecified place in unspecified non-institutional (private) residence as the place of occurrence of the external cause: Secondary | ICD-10-CM | POA: Diagnosis not present

## 2019-07-12 DIAGNOSIS — W1830XA Fall on same level, unspecified, initial encounter: Secondary | ICD-10-CM | POA: Diagnosis present

## 2019-07-12 DIAGNOSIS — J45909 Unspecified asthma, uncomplicated: Secondary | ICD-10-CM | POA: Diagnosis present

## 2019-07-12 DIAGNOSIS — Z87442 Personal history of urinary calculi: Secondary | ICD-10-CM | POA: Diagnosis not present

## 2019-07-12 DIAGNOSIS — D863 Sarcoidosis of skin: Secondary | ICD-10-CM | POA: Diagnosis present

## 2019-07-12 DIAGNOSIS — S42021A Displaced fracture of shaft of right clavicle, initial encounter for closed fracture: Secondary | ICD-10-CM | POA: Diagnosis not present

## 2019-07-12 DIAGNOSIS — Z79899 Other long term (current) drug therapy: Secondary | ICD-10-CM | POA: Diagnosis not present

## 2019-07-12 DIAGNOSIS — S42001A Fracture of unspecified part of right clavicle, initial encounter for closed fracture: Secondary | ICD-10-CM | POA: Diagnosis not present

## 2019-07-12 DIAGNOSIS — S42001P Fracture of unspecified part of right clavicle, subsequent encounter for fracture with malunion: Secondary | ICD-10-CM | POA: Diagnosis present

## 2019-07-12 DIAGNOSIS — Z419 Encounter for procedure for purposes other than remedying health state, unspecified: Secondary | ICD-10-CM

## 2019-07-12 DIAGNOSIS — Z85828 Personal history of other malignant neoplasm of skin: Secondary | ICD-10-CM

## 2019-07-12 HISTORY — DX: Malignant (primary) neoplasm, unspecified: C80.1

## 2019-07-12 HISTORY — PX: ORIF CLAVICULAR FRACTURE: SHX5055

## 2019-07-12 HISTORY — DX: Personal history of urinary calculi: Z87.442

## 2019-07-12 HISTORY — DX: Unspecified osteoarthritis, unspecified site: M19.90

## 2019-07-12 HISTORY — DX: Essential (primary) hypertension: I10

## 2019-07-12 HISTORY — DX: Type 2 diabetes mellitus without complications: E11.9

## 2019-07-12 HISTORY — DX: Fracture of unspecified part of right clavicle, initial encounter for closed fracture: S42.001A

## 2019-07-12 LAB — GLUCOSE, CAPILLARY
Glucose-Capillary: 143 mg/dL — ABNORMAL HIGH (ref 70–99)
Glucose-Capillary: 103 mg/dL — ABNORMAL HIGH (ref 70–99)
Glucose-Capillary: 110 mg/dL — ABNORMAL HIGH (ref 70–99)

## 2019-07-12 SURGERY — OPEN REDUCTION INTERNAL FIXATION (ORIF) CLAVICULAR FRACTURE
Anesthesia: General | Site: Shoulder | Laterality: Right

## 2019-07-12 MED ORDER — PHENYLEPHRINE HCL-NACL 10-0.9 MG/250ML-% IV SOLN
INTRAVENOUS | Status: DC | PRN
  Administered 2019-07-12: 20 ug/min via INTRAVENOUS

## 2019-07-12 MED ORDER — FENTANYL CITRATE (PF) 250 MCG/5ML IJ SOLN
INTRAMUSCULAR | Status: DC | PRN
  Administered 2019-07-12: 25 ug via INTRAVENOUS
  Administered 2019-07-12: 100 ug via INTRAVENOUS

## 2019-07-12 MED ORDER — PROPOFOL 10 MG/ML IV BOLUS
INTRAVENOUS | Status: DC | PRN
  Administered 2019-07-12: 150 mg via INTRAVENOUS

## 2019-07-12 MED ORDER — ONDANSETRON HCL 4 MG/2ML IJ SOLN
INTRAMUSCULAR | Status: DC | PRN
  Administered 2019-07-12: 4 mg via INTRAVENOUS

## 2019-07-12 MED ORDER — CHLORHEXIDINE GLUCONATE 4 % EX LIQD: 60.0000 mL | Freq: Once | CUTANEOUS | Status: DC

## 2019-07-12 MED ORDER — OXYCODONE HCL 5 MG PO TABS: 5.0000 mg | ORAL_TABLET | Freq: Three times a day (TID) | ORAL | 0 refills | Status: AC | PRN

## 2019-07-12 MED ORDER — FENTANYL CITRATE (PF) 250 MCG/5ML IJ SOLN
INTRAMUSCULAR | Status: AC
  Filled 2019-07-12: qty 5

## 2019-07-12 MED ORDER — PHENYLEPHRINE 40 MCG/ML (10ML) SYRINGE FOR IV PUSH (FOR BLOOD PRESSURE SUPPORT)
PREFILLED_SYRINGE | INTRAVENOUS | Status: AC
  Filled 2019-07-12: qty 10

## 2019-07-12 MED ORDER — ROCURONIUM BROMIDE 10 MG/ML (PF) SYRINGE
PREFILLED_SYRINGE | INTRAVENOUS | Status: AC
  Filled 2019-07-12: qty 10

## 2019-07-12 MED ORDER — ONDANSETRON HCL 4 MG/2ML IJ SOLN: 4.0000 mg | Freq: Once | INTRAMUSCULAR | Status: DC | PRN

## 2019-07-12 MED ORDER — SUGAMMADEX SODIUM 200 MG/2ML IV SOLN
INTRAVENOUS | Status: DC | PRN
  Administered 2019-07-12: 100 mg via INTRAVENOUS

## 2019-07-12 MED ORDER — CEFAZOLIN SODIUM-DEXTROSE 2-4 GM/100ML-% IV SOLN
2.0000 g | INTRAVENOUS | Status: AC
  Administered 2019-07-12: 2 g via INTRAVENOUS

## 2019-07-12 MED ORDER — BUPIVACAINE HCL (PF) 0.25 % IJ SOLN
INTRAMUSCULAR | Status: AC
  Filled 2019-07-12: qty 30

## 2019-07-12 MED ORDER — TRANEXAMIC ACID-NACL 1000-0.7 MG/100ML-% IV SOLN
1000.0000 mg | INTRAVENOUS | Status: AC
  Administered 2019-07-12: 1000 mg via INTRAVENOUS
  Filled 2019-07-12: qty 100

## 2019-07-12 MED ORDER — LACTATED RINGERS IV SOLN: INTRAVENOUS | Status: DC

## 2019-07-12 MED ORDER — PROPOFOL 10 MG/ML IV BOLUS
INTRAVENOUS | Status: AC
  Filled 2019-07-12: qty 40

## 2019-07-12 MED ORDER — FENTANYL CITRATE (PF) 100 MCG/2ML IJ SOLN
INTRAMUSCULAR | Status: AC
  Filled 2019-07-12: qty 2

## 2019-07-12 MED ORDER — ROCURONIUM BROMIDE 10 MG/ML (PF) SYRINGE
PREFILLED_SYRINGE | INTRAVENOUS | Status: DC | PRN
  Administered 2019-07-12: 10 mg via INTRAVENOUS
  Administered 2019-07-12: 40 mg via INTRAVENOUS

## 2019-07-12 MED ORDER — ONDANSETRON 4 MG PO TBDP: 4.0000 mg | ORAL_TABLET | Freq: Three times a day (TID) | ORAL | 0 refills | Status: DC | PRN

## 2019-07-12 MED ORDER — FENTANYL CITRATE (PF) 100 MCG/2ML IJ SOLN
25.0000 ug | INTRAMUSCULAR | Status: DC | PRN
  Administered 2019-07-12 (×2): 50 ug via INTRAVENOUS

## 2019-07-12 MED ORDER — BUPIVACAINE HCL 0.25 % IJ SOLN
INTRAMUSCULAR | Status: DC | PRN
  Administered 2019-07-12: 20 mL

## 2019-07-12 MED ORDER — CEFAZOLIN SODIUM-DEXTROSE 2-4 GM/100ML-% IV SOLN
INTRAVENOUS | Status: AC
  Filled 2019-07-12: qty 100

## 2019-07-12 MED ORDER — LIDOCAINE 2% (20 MG/ML) 5 ML SYRINGE
INTRAMUSCULAR | Status: AC
  Filled 2019-07-12: qty 5

## 2019-07-12 MED ORDER — LIDOCAINE 2% (20 MG/ML) 5 ML SYRINGE
INTRAMUSCULAR | Status: DC | PRN
  Administered 2019-07-12: 60 mg via INTRAVENOUS

## 2019-07-12 MED ORDER — ONDANSETRON HCL 4 MG/2ML IJ SOLN
INTRAMUSCULAR | Status: AC
  Filled 2019-07-12: qty 2

## 2019-07-12 MED ORDER — LACTATED RINGERS IV SOLN: INTRAVENOUS | Status: DC | PRN

## 2019-07-12 MED ORDER — 0.9 % SODIUM CHLORIDE (POUR BTL) OPTIME
TOPICAL | Status: DC | PRN
  Administered 2019-07-12: 1000 mL

## 2019-07-12 MED ORDER — ACETAMINOPHEN 500 MG PO TABS
1000.0000 mg | ORAL_TABLET | Freq: Once | ORAL | Status: AC
  Administered 2019-07-12: 12:00:00 1000 mg via ORAL
  Filled 2019-07-12: qty 2

## 2019-07-12 MED ORDER — DEXAMETHASONE SODIUM PHOSPHATE 10 MG/ML IJ SOLN
INTRAMUSCULAR | Status: DC | PRN
  Administered 2019-07-12: 10 mg via INTRAVENOUS

## 2019-07-12 MED ORDER — MIDAZOLAM HCL 2 MG/2ML IJ SOLN
INTRAMUSCULAR | Status: AC
  Filled 2019-07-12: qty 2

## 2019-07-12 MED ORDER — DEXAMETHASONE SODIUM PHOSPHATE 10 MG/ML IJ SOLN
INTRAMUSCULAR | Status: AC
  Filled 2019-07-12: qty 1

## 2019-07-12 MED ORDER — PHENYLEPHRINE 40 MCG/ML (10ML) SYRINGE FOR IV PUSH (FOR BLOOD PRESSURE SUPPORT)
PREFILLED_SYRINGE | INTRAVENOUS | Status: DC | PRN
  Administered 2019-07-12: 120 ug via INTRAVENOUS
  Administered 2019-07-12: 80 ug via INTRAVENOUS
  Administered 2019-07-12: 40 ug via INTRAVENOUS
  Administered 2019-07-12: 80 ug via INTRAVENOUS

## 2019-07-12 SURGICAL SUPPLY — 53 items
ALCOHOL 70% 16 OZ (MISCELLANEOUS) ×3 IMPLANT
BIT DRILL 2 CANN GRADUATED (BIT) ×2 IMPLANT
BIT DRILL 2.5 CANN ENDOSCOPIC (BIT) ×2 IMPLANT
CANISTER SUCT 3000ML PPV (MISCELLANEOUS) ×3 IMPLANT
CLOSURE STERI-STRIP 1/2X4 (GAUZE/BANDAGES/DRESSINGS) ×1
CLOSURE WOUND 1/2 X4 (GAUZE/BANDAGES/DRESSINGS) ×1
CLSR STERI-STRIP ANTIMIC 1/2X4 (GAUZE/BANDAGES/DRESSINGS) ×1 IMPLANT
COVER SURGICAL LIGHT HANDLE (MISCELLANEOUS) ×3 IMPLANT
COVER WAND RF STERILE (DRAPES) ×3 IMPLANT
DRAPE C-ARM 42X72 X-RAY (DRAPES) ×3 IMPLANT
DRAPE U-SHAPE 47X51 STRL (DRAPES) ×3 IMPLANT
DRSG AQUACEL AG ADV 3.5X10 (GAUZE/BANDAGES/DRESSINGS) ×2 IMPLANT
DRSG TEGADERM 4X4.75 (GAUZE/BANDAGES/DRESSINGS) ×3 IMPLANT
DURAPREP 26ML APPLICATOR (WOUND CARE) ×3 IMPLANT
ELECT CAUTERY BLADE 6.4 (BLADE) ×3 IMPLANT
ELECT REM PT RETURN 9FT ADLT (ELECTROSURGICAL) ×3
ELECTRODE REM PT RTRN 9FT ADLT (ELECTROSURGICAL) ×1 IMPLANT
GAUZE XEROFORM 1X8 LF (GAUZE/BANDAGES/DRESSINGS) ×3 IMPLANT
GLOVE BIO SURGEON STRL SZ7 (GLOVE) ×3 IMPLANT
GLOVE BIOGEL PI IND STRL 8 (GLOVE) ×1 IMPLANT
GLOVE BIOGEL PI INDICATOR 8 (GLOVE) ×2
GOWN STRL REUS W/ TWL LRG LVL3 (GOWN DISPOSABLE) ×2 IMPLANT
GOWN STRL REUS W/TWL LRG LVL3 (GOWN DISPOSABLE) ×6
KIT BASIN OR (CUSTOM PROCEDURE TRAY) ×3 IMPLANT
KIT TURNOVER KIT B (KITS) ×3 IMPLANT
MANIFOLD NEPTUNE II (INSTRUMENTS) ×3 IMPLANT
NDL HYPO 25GX1X1/2 BEV (NEEDLE) IMPLANT
NEEDLE HYPO 25GX1X1/2 BEV (NEEDLE) IMPLANT
NS IRRIG 1000ML POUR BTL (IV SOLUTION) ×3 IMPLANT
PACK SHOULDER (CUSTOM PROCEDURE TRAY) ×3 IMPLANT
PAD ARMBOARD 7.5X6 YLW CONV (MISCELLANEOUS) ×6 IMPLANT
PLATE DISTAL CLAVICLE 10H RT (Plate) ×2 IMPLANT
SCREW LOCK T10 FT 18X2.7X (Screw) IMPLANT
SCREW LOCKING 2.7X16MM (Screw) ×2 IMPLANT
SCREW LOCKING 2.7X18MM (Screw) ×3 IMPLANT
SCREW LOW PROFILE 3.5X14 (Screw) ×4 IMPLANT
SCREW LOW PROFILE 3.5X16 (Screw) ×2 IMPLANT
SCREW NON-LOCKING 3.5X12MM (Screw) ×10 IMPLANT
SCREW NON-LOCKING 3.5X20 ANKLE (Screw) ×2 IMPLANT
SLING ARM FOAM STRAP MED (SOFTGOODS) ×2 IMPLANT
SLING ARM FOAM STRAP SML (SOFTGOODS) IMPLANT
STAPLER VISISTAT 35W (STAPLE) IMPLANT
STRIP CLOSURE SKIN 1/2X4 (GAUZE/BANDAGES/DRESSINGS) ×2 IMPLANT
SUCTION FRAZIER HANDLE 10FR (MISCELLANEOUS) ×3
SUCTION TUBE FRAZIER 10FR DISP (MISCELLANEOUS) ×1 IMPLANT
SUT MNCRL AB 4-0 PS2 18 (SUTURE) ×3 IMPLANT
SUT VIC AB 0 CT1 27 (SUTURE) ×3
SUT VIC AB 0 CT1 27XBRD ANBCTR (SUTURE) ×1 IMPLANT
SUT VIC AB 2-0 CT1 27 (SUTURE) ×3
SUT VIC AB 2-0 CT1 TAPERPNT 27 (SUTURE) ×1 IMPLANT
SYR CONTROL 10ML LL (SYRINGE) IMPLANT
UNDERPAD 30X30 (UNDERPADS AND DIAPERS) ×3 IMPLANT
YANKAUER SUCT BULB TIP NO VENT (SUCTIONS) ×3 IMPLANT

## 2019-07-12 NOTE — H&P (Signed)
ORTHOPAEDIC H and P  REQUESTING PHYSICIAN: Nicholes Stairs, MD  PCP:  Dian Queen, MD  Chief Complaint: Right clavicle fracture  HPI: Sabrina Eaton is a 79 y.o. female who complains of right clavicle pain and deformity following a ground-level fall earlier this month.  She presented to my office for follow-up.  She had increased displacement and tenting of the skin.  She is indicated for open reduction and internal fixation.  No new complaints today.  Past Medical History:  Diagnosis Date  . Arthritis   . Asthma due to seasonal allergies   . Cancer ([place])    skin cancer - face - basal  . Diabetes mellitus without complication (HCC)    Type II  . Environmental allergies   . 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 Hypertension   . Right clavicle fracture   . Sarcoidosis    effecting the eyes   Past Surgical History:  Procedure Laterality Date  . BRONCHOSCOPY      x 2 to determine if she has scarcoidosis  . COLONOSCOPY W/ POLYPECTOMY     Social History   Socioeconomic History  . Marital status: Widowed    Spouse name: Not on file  . Number of children: Not on file  . Years of education: Not on file  . Highest education level: Not on file  Occupational History  . Not on file  Tobacco Use  . Smoking status: Current Some Day Smoker    Packs/day: 1.00  . Smokeless tobacco: Never Used  Substance and Sexual Activity  . Alcohol use: Not Currently  . Drug use: Not Currently  . Sexual activity: Not on file  Other Topics Concern  . Not on file  Social History Narrative  . Not on file   Social Determinants of Health   Financial Resource Strain:   . Difficulty of Paying Living Expenses:   Food Insecurity:   . Worried About Charity fundraiser in the Last Year:   . Arboriculturist in the Last Year:   Transportation Needs:   . Film/video editor (Medical):   Marland Kitchen Lack of Transportation (Non-Medical):   Physical  Activity:   . Days of Exercise per Week:   . Minutes of Exercise per Session:   Stress:   . Feeling of Stress :   Social Connections:   . Frequency of Communication with Friends and Family:   . Frequency of Social Gatherings with Friends and Family:   . Attends Religious Services:   . Active Member of Clubs or Organizations:   . Attends Archivist Meetings:   Marland Kitchen Marital Status:    History reviewed. No pertinent family history. No Known Allergies Prior to Admission medications   Medication Sig Start Date End Date Taking? Authorizing Provider  acetaminophen (TYLENOL) 500 MG tablet Take 500 mg by mouth every 6 (six) hours as needed for mild pain.   Yes [provider]  brimonidine (ALPHAGAN) 0.2 % ophthalmic solution Place 1 drop into the left eye in the morning and at bedtime.   Yes [provider]  citalopram (CELEXA) 20 MG tablet Take 20 mg by mouth daily.   Yes [provider]  dorzolamide (TRUSOPT) 2 % ophthalmic solution Place 1 drop into the left eye 2 (two) times daily.   Yes [provider]  doxylamine, Sleep, (UNISOM) 25 MG tablet Take 25 mg by mouth  at bedtime.   Yes [provider]  ezetimibe-simvastatin (VYTORIN) 10-40 MG tablet Take 1 tablet by mouth daily.   Yes [provider]  metFORMIN (GLUCOPHAGE) 500 MG tablet Take 500 mg by mouth in the morning, at noon, and at bedtime.   Yes [provider]  metoprolol tartrate (LOPRESSOR) 50 MG tablet Take 50 mg by mouth daily.   Yes [provider]  Multiple Vitamins-Minerals (CENTRUM VITAMINTS PO) Take 1 tablet by mouth daily.   Yes [provider]  niacin (NIASPAN) 1000 MG CR tablet Take 1,000 mg by mouth daily.   Yes [provider]  Omega-3 Fatty Acids (FISH OIL) 1000 MG CAPS Take 1 capsule by mouth in the morning, at noon, and at bedtime.   Yes [provider]  prednisoLONE acetate (PREDNISOLONE ACETATE P-F) 1 % ophthalmic  suspension Place 1 drop into the left eye 4 (four) times daily.   Yes [provider]  timolol (BETIMOL) 0.5 % ophthalmic solution Place 1 drop into both eyes 2 (two) times daily.   Yes [provider]  HYDROcodone-acetaminophen (NORCO) 5-325 MG tablet Take 1-2 tablets by mouth every 6 (six) hours as needed. 06/29/19   Veryl Speak, MD   No results found.  Positive ROS: All other systems have been reviewed and were otherwise negative with the exception of those mentioned in the HPI and as above.  Physical Exam: General: Alert, no acute distress Cardiovascular: No pedal edema Respiratory: No cyanosis, no use of accessory musculature GI: No organomegaly, abdomen is soft and non-tender Skin: No lesions in the area of chief complaint Neurologic: Sensation intact distally Psychiatric: Patient is competent for consent with normal mood and affect Lymphatic: No axillary or cervical lymphadenopathy  MUSCULOSKELETAL:  Obvious deformity of the right clavicle with tenting of the skin.  No open wounds.  Distally neurovascular intact.  Assessment: Closed, segmental right clavicle fracture  Plan: -Our plan will be for open reduction internal fixation of this clavicle fracture.  This will mitigate risk of converting to an open injury and also stabilize the injury for pain relief.  We again discussed the risk of bleeding, infection, damage to surrounding neurovascular structures, hardware pain and hardware failure, risk of anesthesia, need for further surgery.  She has provided informed consent.  -We will plan for overnight observation versus discharge home.    Nicholes Stairs, MD Cell [phone]    07/12/2019 12:16 PM

## 2019-07-12 NOTE — Brief Op Note (Signed)
07/12/2019  1:57 PM  PATIENT:  Sabrina Eaton  79 y.o. female  PRE-OPERATIVE DIAGNOSIS:  Right Clavicle Fracture  POST-OPERATIVE DIAGNOSIS:  Right Clavicle Fracture  PROCEDURE:  Procedure(s): OPEN REDUCTION INTERNAL FIXATION (ORIF) CLAVICULAR FRACTURE (Right)  SURGEON:  Surgeon(s) and Role:    * Nicholes Stairs, MD - Primary  PHYSICIAN ASSISTANT:   ASSISTANTS: Laure Kidney, RNFA   ANESTHESIA:   general  EBL:  30 mL   BLOOD ADMINISTERED:none  DRAINS: none   LOCAL MEDICATIONS USED:  MARCAINE     SPECIMEN:  No Specimen and Mastectomy with SLN  DISPOSITION OF SPECIMEN:  N/A  COUNTS:  YES  TOURNIQUET:  * No tourniquets in log *  DICTATION: .Note written in EPIC  PLAN OF CARE: Discharge to home after PACU  PATIENT DISPOSITION:  PACU - hemodynamically stable.   Delay start of Pharmacological VTE agent (>24hrs) due to surgical blood loss or risk of bleeding: not applicable

## 2019-07-12 NOTE — Op Note (Signed)
Date of Surgery: 07/12/2019  INDICATIONS: Sabrina Eaton is a 79 y.o.-year-old female who sustained a right segmental clavicle fracture.  She sustained a ground-level fall about 2 weeks ago.  She sustained facial laceration and multiple rib fractures on the right side in addition to a right clavicle fracture.  On repeat x-rays in my office it was noted that her clavicle fracture was actually segmental.  There was an intercalary piece that had flipped anteriorly and was tenting the skin.  There was concern for open fracture and painful nonunion.  She is here today for open reduction internal fixation.;  The patient did consent to the procedure after discussion of the risks and benefits.  PREOPERATIVE DIAGNOSIS: Right clavicle fracture  POSTOPERATIVE DIAGNOSIS: Same.  PROCEDURE: Open treatment of right clavicle fracture with internal fixation  SURGEON: Geralynn Rile, M.D.  ASSIST: Sabrina Eaton, RNFA.  ANESTHESIA:  general  IV FLUIDS AND URINE: See anesthesia.  ESTIMATED BLOOD LOSS: 30 mL.  IMPLANTS: Arthrex 10 hole distal clavicle plate  COMPLICATIONS: None.  DESCRIPTION OF PROCEDURE: The patient was brought to the operating room and placed supine on the operating table.  The patient had been signed prior to the procedure and this was documented. The patient had the anesthesia placed by the anesthesiologist.  The patient was then placed in the beach chair position.  All bony prominences were well padded.  A time-out was performed to confirm that this was the correct patient, site, side and location. The patient had an SCD on the lower extremities. The patient did receive antibiotics prior to the incision and was re-dosed during the procedure as needed at indicated intervals.  The patient had the operative extremity prepped and draped in the standard surgical fashion.     A horizontal incision based over the clavicle was used.  Cutaneous nerves were identified and protected as much as possible.   Full thickness flaps were elevated off of the clavicle.  The fracture was exposed.  Any organized hematoma and entrapped periosteum was retrieved from the fracture site. Reduction was obtained using clamps and a superior plate was applied to the clavicle.  The appropriate length was found by using fluoroscopy.  With the plate in the appropriate position and the fracture reduced.  Nonlocking screws were placed through the plate and into the clavicle.  Care was taken not to plunge with any of the instruments.  Retractors were used as added protection to the neurovascular and pulmonary structures.   Ultimately the decision was made not to span the distalmost fragment.  This would have required crossing the [facility] joint with a hook type construct or spanning the [facility] joint with our plate.  The CC interspace was in normal limits and therefore the suspensory ligament should help maintain position of the distal clavicle.  We were able to get about 2 locking screws in the distal fragment which was quite comminuted and very very poor bone quality.   Final fluoroscopy pictures were taken to confirm plate placement and fracture reduction.  The wound was thoroughly irrigated and closed in a layer fashion using 0 vicryl, 2.0 vicryl and 4-0 Monocryl.  Sterile dressings were applied and the patient was extubated and transferred to the PACU in stable condition.  POSTOPERATIVE PLAN: Patient will be in a sling for comfort.  But will be nonweightbearing for at least the first 2 weeks.  We will see her back in the office with new x-rays at 2 weeks.  She was easily extubated and  cleaned appropriate to discharge home.

## 2019-07-12 NOTE — Discharge Instructions (Signed)
-  Maintain postoperative bandage until your follow-up appointment.  This is waterproof you may shower but please do not submerge underwater.  -Maintain right arm in sling at all times unless doing activities of daily living.  No lifting.  -Apply ice to the right shoulder incision for 30 minutes/h that you are awake.  -Return to see Dr. Stann Mainland in 2 weeks for routine postop wound check.

## 2019-07-12 NOTE — Anesthesia Procedure Notes (Signed)
Procedure Name: Intubation Date/Time: 07/12/2019 12:37 PM Performed by: Harden Mo, CRNA Pre-anesthesia Checklist: Patient identified, Emergency Drugs available, Suction available and Patient being monitored Patient Re-evaluated:Patient Re-evaluated prior to induction Oxygen Delivery Method: Circle System Utilized Preoxygenation: Pre-oxygenation with 100% oxygen Induction Type: IV induction Ventilation: Mask ventilation without difficulty and Oral airway inserted - appropriate to patient size Laryngoscope Size: Sabra Heck and 2 Grade View: Grade I Tube type: Oral Tube size: 7.0 mm Number of attempts: 1 Airway Equipment and Method: Stylet and Oral airway Placement Confirmation: ETT inserted through vocal cords under direct vision,  positive ETCO2 and breath sounds checked- equal and bilateral Secured at: 21 cm Tube secured with: Tape Dental Injury: Teeth and Oropharynx as per pre-operative assessment

## 2019-07-12 NOTE — Transfer of Care (Signed)
Immediate Anesthesia Transfer of Care Note  Patient: Sabrina Eaton  Procedure(s) Performed: OPEN REDUCTION INTERNAL FIXATION (ORIF) CLAVICULAR FRACTURE (Right Shoulder)  Patient Location: PACU  Anesthesia Type:General  Level of Consciousness: awake and alert   Airway & Oxygen Therapy: Patient Spontanous Breathing  Post-op Assessment: Report given to RN, Post -op Vital signs reviewed and stable and Patient moving all extremities X 4  Post vital signs: Reviewed and stable  Last Vitals:  Vitals Value Taken Time  BP    Temp    Pulse 76 07/12/19 1405  Resp    SpO2 100 % 07/12/19 1405  Vitals shown include unvalidated device data.  Last Pain:  Vitals:   07/12/19 1121  TempSrc:   PainSc: 0-No pain      Patients Stated Pain Goal: 3 ([id number])  Complications: No apparent anesthesia complications

## 2019-07-12 NOTE — Anesthesia Preprocedure Evaluation (Addendum)
Anesthesia Evaluation  Patient identified by MRN, date of birth, ID band Patient awake    Reviewed: Allergy & Precautions, NPO status , Patient's Chart, lab work & pertinent test results  Airway Mallampati: III  TM Distance: >3 FB Neck ROM: Full    Dental no notable dental hx. (+) Teeth Intact, Dental Advisory Given   Pulmonary asthma , Current Smoker and Patient abstained from smoking.,    Pulmonary exam normal breath sounds clear to auscultation       Cardiovascular hypertension, Pt. on home beta blockers Normal cardiovascular exam Rhythm:Regular Rate:Normal  ECG: NSR, rate 71   Neuro/Psych negative neurological ROS  negative psych ROS   GI/Hepatic negative GI ROS, Neg liver ROS,   Endo/Other  diabetes, Oral Hypoglycemic Agents  Renal/GU negative Renal ROS     Musculoskeletal  (+) Arthritis , RIGHT midshaft clavicle with cranial angulation.   Abdominal   Peds  Hematology negative hematology ROS (+)   Anesthesia Other Findings Right Clavicle Fracture  Reproductive/Obstetrics                          Anesthesia Physical Anesthesia Plan  ASA: II  Anesthesia Plan: General   Post-op Pain Management:    Induction: Intravenous  PONV Risk Score and Plan: 2 and Ondansetron, Dexamethasone and Treatment may vary due to age or medical condition  Airway Management Planned: Oral ETT  Additional Equipment:   Intra-op Plan:   Post-operative Plan: Extubation in OR  Informed Consent: I have reviewed the patients History and Physical, chart, labs and discussed the procedure including the risks, benefits and alternatives for the proposed anesthesia with the patient or authorized representative who has indicated his/her understanding and acceptance.     Dental advisory given  Plan Discussed with: CRNA  Anesthesia Plan Comments:        Anesthesia Quick Evaluation

## 2019-07-12 NOTE — Anesthesia Postprocedure Evaluation (Signed)
Anesthesia Post Note  Patient: Sabrina Eaton  Procedure(s) Performed: OPEN REDUCTION INTERNAL FIXATION (ORIF) CLAVICULAR FRACTURE (Right Shoulder)     Patient location during evaluation: PACU Anesthesia Type: General Level of consciousness: awake and alert Pain management: pain level controlled Vital Signs Assessment: post-procedure vital signs reviewed and stable Respiratory status: spontaneous breathing, nonlabored ventilation, respiratory function stable and patient connected to nasal cannula oxygen Cardiovascular status: blood pressure returned to baseline and stable Postop Assessment: no apparent nausea or vomiting Anesthetic complications: no    Last Vitals:  Vitals:   07/12/19 1517 07/12/19 1524  BP: (!) 117/49 (!) 129/51  Pulse: 72 74  Resp: 13 15  Temp:  (!) 36.1 C  SpO2: 91% 94%    Last Pain:  Vitals:   07/12/19 1432  TempSrc:   PainSc: 8                  Daysie Helf P Seraphim Affinito

## 2019-07-14 ENCOUNTER — Encounter: Payer: Self-pay | Admitting: *Deleted

## 2019-07-23 NOTE — Discharge Summary (Signed)
Patient ID: Sabrina Eaton MRN: [id number] DOB/AGE: January 24, 1941 79 y.o.  Admit date: 07/12/2019 Discharge date: 07/12/2019  Primary Diagnosis: right clavicle fracture  Admission Diagnoses:  Past Medical History:  Diagnosis Date  . Arthritis   . Asthma due to seasonal allergies   . Cancer ([place])    skin cancer - face - basal  . Diabetes mellitus without complication (HCC)    Type II  . Environmental allergies   . 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 Hypertension   . Right clavicle fracture   . 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   Weight as of this encounter: 56.2 kg.  Procedure:  Procedure(s) (LRB): OPEN REDUCTION INTERNAL FIXATION (ORIF) CLAVICULAR FRACTURE (Right)   Consults: None  HPI: patient was not admitted.   Laboratory Data: Admission on 07/12/2019, Discharged on 07/12/2019  Component Date Value Ref Range Status  . Glucose-Capillary 07/12/2019 143* 70 - 99 mg/dL Final   Glucose reference range applies only to samples taken after fasting for at least 8 hours.  . Glucose-Capillary 07/12/2019 103* 70 - 99 mg/dL Final   Glucose reference range applies only to samples taken after fasting for at least 8 hours.  . Glucose-Capillary 07/12/2019 110* 70 - 99 mg/dL Final   Glucose reference range applies only to samples taken after fasting for at least 8 hours.  . Comment 1 07/12/2019 Notify RN   Final  Hospital Outpatient Visit on 07/08/2019  Component Date Value Ref Range Status  . SARS Coronavirus 2 07/08/2019 NEGATIVE  NEGATIVE Final   Comment: (NOTE) SARS-CoV-2 target nucleic acids are NOT DETECTED. The SARS-CoV-2 RNA is generally detectable in upper and lower respiratory specimens during the acute phase of  infection. Negative results do not preclude SARS-CoV-2 infection, do not rule out co-infections with other pathogens, and should not be used as the sole basis for treatment or other patient management decisions. Negative results must be combined with clinical observations, patient history, and epidemiological information. The expected result is Negative. Fact Sheet for Patients: SugarRoll.be Fact Sheet for Healthcare Providers: https://www.woods-mathews.com/ This test is not yet approved or cleared by the [place] FDA and  has been authorized for detection and/or diagnosis of SARS-CoV-2 by FDA under an Emergency Use Authorization (EUA). This EUA will remain  in effect (meaning this test can be used) for the duration of the COVID-19 declaration under Section 56                          4(b)(1) of the Act, 21 U.S.C. section 360bbb-3(b)(1), unless the authorization is terminated or revoked sooner. Performed at [facility], [place] [address], [place], [place] [address]   Admission on 06/29/2019, Discharged on 06/29/2019  Component Date Value Ref Range Status  . WBC 06/29/2019 14.1* 4.0 - 10.5 K/uL Final  . RBC 06/29/2019 4.60  3.87 - 5.11 MIL/uL Final  . Hemoglobin 06/29/2019 14.5  12.0 - 15.0 g/dL Final  . HCT 06/29/2019 45.2  36.0 - 46.0 % Final  . MCV 06/29/2019 98.3  80.0 - 100.0 fL Final  . MCH 06/29/2019 31.5  26.0 - 34.0 pg Final  .  MCHC 06/29/2019 32.1  30.0 - 36.0 g/dL Final  . RDW 06/29/2019 11.9  11.5 - 15.5 % Final  . Platelets 06/29/2019 292  150 - 400 K/uL Final  . nRBC 06/29/2019 0.0  0.0 - 0.2 % Final   Performed at [facility], [place] [address], [place], [place] [address]  . Sodium 06/29/2019 138  135 - 145 mmol/L Final  . Potassium 06/29/2019 4.6  3.5 - 5.1 mmol/L Final  . Chloride 06/29/2019 107  98 - 111 mmol/L Final  . CO2 06/29/2019 21* 22 - 32 mmol/L Final  . Glucose, Bld 06/29/2019 149* 70 - 99 mg/dL  Final   Glucose reference range applies only to samples taken after fasting for at least 8 hours.  . BUN 06/29/2019 29* 8 - 23 mg/dL Final  . Creatinine, Ser 06/29/2019 0.71  0.44 - 1.00 mg/dL Final  . Calcium 06/29/2019 9.9  8.9 - 10.3 mg/dL Final  . GFR calc non Af Amer 06/29/2019 >60  >60 mL/min Final  . GFR calc Af Amer 06/29/2019 >60  >60 mL/min Final  . Anion gap 06/29/2019 10  5 - 15 Final   Performed at [facility], [place] [address], [place], [place] [address]     X-Rays:DG Clavicle Right  Result Date: 07/12/2019 CLINICAL DATA:  Clavicle fixation EXAM: RIGHT CLAVICLE - 2+ VIEWS; DG C-ARM 1-60 MIN COMPARISON:  Radiograph 06/29/2019 FINDINGS: internal fixation of RIGHT clavicle fracture with dynamic plate and multiple cortical screws. Normal alignment. IMPRESSION: RIGHT clavicle internal fixation.  No complicating features Electronically Signed   By: Suzy Bouchard M.D.   On: 07/12/2019 15:40   DG Shoulder Right  Addendum Date: 06/29/2019   ADDENDUM REPORT: 06/29/2019 18:26 ADDENDUM: Upon further review and discussion with emergency room doctor, there is a fracture of the RIGHT midshaft clavicle with cranial angulation. Additionally there is a fracture of the right second rib. Irregularities of the right anterior third and fourth ribs anteriorly are presumably acute fractures also Findings conveyed toDelo, MD on 06/29/2019  at18:22. Electronically Signed   By: Suzy Bouchard M.D.   On: 06/29/2019 18:26   Result Date: 06/29/2019 CLINICAL DATA:  Fall. Pt was bending over to pick something up at home when she fell landing on her right side. Pt has pain in the anterior and posterior sides of her right shoulder. Pt said her pain is not constant but describes it as "very unc.*comment was truncated* EXAM: RIGHT SHOULDER - 2+ VIEW COMPARISON:  None. FINDINGS: Glenohumeral joint is intact. No evidence of scapular fracture or humeral fracture. The acromioclavicular joint is intact. IMPRESSION: No  fracture or dislocation. Electronically Signed: By: Suzy Bouchard M.D. On: 06/29/2019 14:59   DG C-Arm 1-60 Min  Result Date: 07/12/2019 CLINICAL DATA:  Clavicle fixation EXAM: RIGHT CLAVICLE - 2+ VIEWS; DG C-ARM 1-60 MIN COMPARISON:  Radiograph 06/29/2019 FINDINGS: internal fixation of RIGHT clavicle fracture with dynamic plate and multiple cortical screws. Normal alignment. IMPRESSION: RIGHT clavicle internal fixation.  No complicating features Electronically Signed   By: Suzy Bouchard M.D.   On: 07/12/2019 15:40    EKG: Orders placed or performed during the hospital encounter of 06/29/19  . EKG 12-Lead  . EKG 12-Lead  . EKG     Hospital Course: OSWIN ELLICK is a 79 y.o. was brought to the operating room on 07/12/2019 and underwent Procedure(s): OPEN REDUCTION INTERNAL FIXATION (ORIF) CLAVICULAR FRACTURE.  Patient tolerated the procedure well and was later transferred to the  recovery room.  D/c home from PACU. Anti-infectives (From admission, onward)   Start     Dose/Rate Route Frequency Ordered Stop   07/12/19 1100  ceFAZolin (ANCEF) IVPB 2g/100 mL premix     2 g 200 mL/hr over 30 Minutes Intravenous On call to O.R. 07/12/19 1059 07/12/19 1250       Diet: Regular diet Activity:NWB Follow-up:in 2 weeks Disposition - Home Discharged Condition: good   Discharge Instructions    Call MD / Call 911   Complete by: As directed    If you experience chest pain or shortness of breath, CALL 911 and be transported to the hospital emergency room.  If you develope a fever above 101 F, pus (white drainage) or increased drainage or redness at the wound, or calf pain, call your surgeon's office.   Constipation Prevention   Complete by: As directed    Drink plenty of fluids.  Prune juice may be helpful.  You may use a stool softener, such as Colace (over the counter) 100 mg twice a day.  Use MiraLax (over the counter) for constipation as needed.   Diet - low sodium heart healthy    Complete by: As directed    Increase activity slowly as tolerated   Complete by: As directed      Allergies as of 07/12/2019   No Known Allergies     Medication List    TAKE these medications   acetaminophen 500 MG tablet Commonly known as: TYLENOL Take 500 mg by mouth every 6 (six) hours as needed for mild pain.   brimonidine 0.2 % ophthalmic solution Commonly known as: ALPHAGAN Place 1 drop into the left eye in the morning and at bedtime.   CENTRUM VITAMINTS PO Take 1 tablet by mouth daily.   citalopram 20 MG tablet Commonly known as: CELEXA Take 20 mg by mouth daily.   dorzolamide 2 % ophthalmic solution Commonly known as: TRUSOPT Place 1 drop into the left eye 2 (two) times daily.   doxylamine (Sleep) 25 MG tablet Commonly known as: UNISOM Take 25 mg by mouth at bedtime.   ezetimibe-simvastatin 10-40 MG tablet Commonly known as: VYTORIN Take 1 tablet by mouth daily.   Fish Oil 1000 MG Caps Take 1 capsule by mouth in the morning, at noon, and at bedtime.   HYDROcodone-acetaminophen 5-325 MG tablet Commonly known as: Norco Take 1-2 tablets by mouth every 6 (six) hours as needed.   metFORMIN 500 MG tablet Commonly known as: GLUCOPHAGE Take 500 mg by mouth in the morning, at noon, and at bedtime.   metoprolol tartrate 50 MG tablet Commonly known as: LOPRESSOR Take 50 mg by mouth daily.   niacin 1000 MG CR tablet Commonly known as: NIASPAN Take 1,000 mg by mouth daily.   ondansetron 4 MG disintegrating tablet Commonly known as: Zofran ODT Take 1 tablet (4 mg total) by mouth every 8 (eight) hours as needed.   oxyCODONE 5 MG immediate release tablet Commonly known as: Roxicodone Take 1 tablet (5 mg total) by mouth every 8 (eight) hours as needed.   prednisoLONE Acetate P-F 1 % ophthalmic suspension Generic drug: prednisoLONE acetate Place 1 drop into the left eye 4 (four) times daily.   timolol 0.5 % ophthalmic solution Commonly known as:  BETIMOL Place 1 drop into both eyes 2 (two) times daily.      Follow-up Information    Nicholes Stairs, MD In 2 weeks.   Specialty: Orthopedic Surgery Why: For wound re-check Contact  information: [address] [place] [address] [id number]           Signed: Geralynn Rile, MD Orthopaedic Surgery 07/23/2019, 8:51 AM

## 2019-07-26 DIAGNOSIS — M25511 Pain in right shoulder: Secondary | ICD-10-CM | POA: Insufficient documentation

## 2019-09-22 ENCOUNTER — Other Ambulatory Visit: Payer: Self-pay

## 2019-09-22 ENCOUNTER — Encounter (INDEPENDENT_AMBULATORY_CARE_PROVIDER_SITE_OTHER): Payer: Self-pay | Admitting: Ophthalmology

## 2019-09-22 ENCOUNTER — Ambulatory Visit (INDEPENDENT_AMBULATORY_CARE_PROVIDER_SITE_OTHER): Payer: Medicare Other | Admitting: Ophthalmology

## 2019-09-22 DIAGNOSIS — D869 Sarcoidosis, unspecified: Secondary | ICD-10-CM

## 2019-09-22 DIAGNOSIS — H401123 Primary open-angle glaucoma, left eye, severe stage: Secondary | ICD-10-CM | POA: Insufficient documentation

## 2019-09-22 DIAGNOSIS — H2012 Chronic iridocyclitis, left eye: Secondary | ICD-10-CM | POA: Diagnosis not present

## 2019-09-22 DIAGNOSIS — H35352 Cystoid macular degeneration, left eye: Secondary | ICD-10-CM

## 2019-09-22 DIAGNOSIS — H401111 Primary open-angle glaucoma, right eye, mild stage: Secondary | ICD-10-CM | POA: Insufficient documentation

## 2019-09-22 DIAGNOSIS — E119 Type 2 diabetes mellitus without complications: Secondary | ICD-10-CM | POA: Insufficient documentation

## 2019-09-22 NOTE — Progress Notes (Signed)
09/22/2019     CHIEF COMPLAINT Patient presents for Retina Follow Up   HISTORY OF PRESENT ILLNESS: Sabrina Eaton is a 79 y.o. female who presents to the clinic today for:   HPI    Retina Follow Up    Patient presents with  Other.  In both eyes.  Severity is moderate.  Duration of 4 months.  Since onset it is stable.  I, the attending physician,  performed the HPI with the patient and updated documentation appropriately.          Comments    4 Month f\u OU. OCT  Pt states vision is stable. Using gtts as directed. LBS 89 yesterday A1C 5.03 May 2019       Last edited by Gerda Diss on 09/22/2019  8:20 AM. (History)      Referring physician: Dian Queen, MD [address],  [place] [id number]  HISTORICAL INFORMATION:   Selected notes from the MEDICAL RECORD NUMBER       CURRENT MEDICATIONS: Current Outpatient Medications (Ophthalmic Drugs)  Medication Sig   brimonidine (ALPHAGAN) 0.2 % ophthalmic solution Place 1 drop into the left eye in the morning and at bedtime.   dorzolamide (TRUSOPT) 2 % ophthalmic solution Place 1 drop into the left eye 2 (two) times daily.   prednisoLONE acetate (PREDNISOLONE ACETATE P-F) 1 % ophthalmic suspension Place 1 drop into the left eye 4 (four) times daily.   timolol (BETIMOL) 0.5 % ophthalmic solution Place 1 drop into both eyes 2 (two) times daily.   No current facility-administered medications for this visit. (Ophthalmic Drugs)   Current Outpatient Medications (Other)  Medication Sig   acetaminophen (TYLENOL) 500 MG tablet Take 500 mg by mouth every 6 (six) hours as needed for mild pain.   citalopram (CELEXA) 20 MG tablet Take 20 mg by mouth daily.   doxylamine, Sleep, (UNISOM) 25 MG tablet Take 25 mg by mouth at bedtime.   ezetimibe-simvastatin (VYTORIN) 10-40 MG tablet Take 1 tablet by mouth daily.   HYDROcodone-acetaminophen (NORCO) 5-325 MG tablet Take 1-2 tablets by mouth every 6 (six) hours  as needed.   metFORMIN (GLUCOPHAGE) 500 MG tablet Take 500 mg by mouth in the morning, at noon, and at bedtime.   metoprolol tartrate (LOPRESSOR) 50 MG tablet Take 50 mg by mouth daily.   Multiple Vitamins-Minerals (CENTRUM VITAMINTS PO) Take 1 tablet by mouth daily.   niacin (NIASPAN) 1000 MG CR tablet Take 1,000 mg by mouth daily.   Omega-3 Fatty Acids (FISH OIL) 1000 MG CAPS Take 1 capsule by mouth in the morning, at noon, and at bedtime.   ondansetron (ZOFRAN ODT) 4 MG disintegrating tablet Take 1 tablet (4 mg total) by mouth every 8 (eight) hours as needed.   oxyCODONE (ROXICODONE) 5 MG immediate release tablet Take 1 tablet (5 mg total) by mouth every 8 (eight) hours as needed.   No current facility-administered medications for this visit. (Other)      REVIEW OF SYSTEMS:    ALLERGIES No Known Allergies  PAST MEDICAL HISTORY Past Medical History:  Diagnosis Date   Arthritis    Asthma due to seasonal allergies    Cancer ([place])    skin cancer - face - basal   Diabetes mellitus without complication ([place])    Type II   Environmental allergies    History of kidney stones    1 small in each kidney - has had for years   Hypertension  Right clavicle fracture    Sarcoidosis    effecting the eyes   Past Surgical History:  Procedure Laterality Date   BRONCHOSCOPY      x 2 to determine if she has scarcoidosis   COLONOSCOPY W/ POLYPECTOMY     ORIF CLAVICULAR FRACTURE Right 07/12/2019   Procedure: OPEN REDUCTION INTERNAL FIXATION (ORIF) CLAVICULAR FRACTURE;  Surgeon: Nicholes Stairs, MD;  Location: [facility];  Service: Orthopedics;  Laterality: Right;    FAMILY HISTORY History reviewed. No pertinent family history.  SOCIAL HISTORY Social History   Tobacco Use   Smoking status: Current Some Day Smoker    Packs/day: 1.00   Smokeless tobacco: Never Used  Substance Use Topics   Alcohol use: Not Currently   Drug use: Not Currently          OPHTHALMIC EXAM: Base Eye Exam    Visual Acuity (Snellen - Linear)      Right Left   Dist cc 20/20-1 20/70+1   Dist ph cc  20/60-   Correction: Glasses       Tonometry (Tonopen, 8:23 AM)      Right Left   Pressure 10 11       Pupils      Pupils Dark Light Shape React APD   Right PERRL 4 3 Round Slow None   Left PERRL 4 4 Round Slow +1       Visual Fields (Counting fingers)      Left Right    Full Full       Extraocular Movement      Right Left    Full Full       Neuro/Psych    Oriented x3: Yes   Mood/Affect: Normal       Dilation    Both eyes: 1.0% Mydriacyl, 2.5% Phenylephrine @ 8:23 AM        Slit Lamp and Fundus Exam    External Exam      Right Left   External Normal Normal       Slit Lamp Exam      Right Left   Lids/Lashes Normal Normal   Conjunctiva/Sclera White and quiet White and quiet   Cornea Clear Clear   Anterior Chamber Deep and quiet Deep and quiet   Iris Round and reactive Round and reactive   Lens Posterior chamber intraocular lens Posterior chamber intraocular lens   Vitreous Normal Normal          IMAGING AND PROCEDURES  Imaging and Procedures for 09/22/19  OCT, Retina - OU - Both Eyes       Right Eye Quality was good. Scan locations included subfoveal.   Left Eye Quality was good. Scan locations included subfoveal.                 ASSESSMENT/PLAN:  No problem-specific Assessment & Plan notes found for this encounter.    No diagnosis found.  1.  2.  3.  Ophthalmic Meds Ordered this visit:  No orders of the defined types were placed in this encounter.      No follow-ups on file.  There are no Patient Instructions on file for this visit.   Explained the diagnoses, plan, and follow up with the patient and they expressed understanding.  Patient expressed understanding of the importance of proper follow up care.   Clent Demark Tawsha Terrero M.D. Diseases & Surgery of the Retina and Vitreous Retina &  Diabetic [place] 09/22/19     Abbreviations: M myopia (  nearsighted); A astigmatism; H hyperopia (farsighted); P presbyopia; Mrx spectacle prescription;  CTL contact lenses; OD right eye; OS left eye; OU both eyes  XT exotropia; ET esotropia; PEK punctate epithelial keratitis; PEE punctate epithelial erosions; DES dry eye syndrome; MGD meibomian gland dysfunction; ATs artificial tears; PFAT's preservative free artificial tears; [place] nuclear sclerotic cataract; PSC posterior subcapsular cataract; ERM epi-retinal membrane; PVD posterior vitreous detachment; RD retinal detachment; DM diabetes mellitus; DR diabetic retinopathy; NPDR non-proliferative diabetic retinopathy; PDR proliferative diabetic retinopathy; CSME clinically significant macular edema; DME diabetic macular edema; dbh dot blot hemorrhages; CWS cotton wool spot; POAG primary open angle glaucoma; C/D cup-to-disc ratio; HVF humphrey visual field; GVF goldmann visual field; OCT optical coherence tomography; IOP intraocular pressure; BRVO Branch retinal vein occlusion; CRVO central retinal vein occlusion; CRAO central retinal artery occlusion; BRAO branch retinal artery occlusion; RT retinal tear; SB scleral buckle; PPV pars plana vitrectomy; VH Vitreous hemorrhage; PRP panretinal laser photocoagulation; IVK intravitreal kenalog; VMT vitreomacular traction; MH Macular hole;  NVD neovascularization of the disc; NVE neovascularization elsewhere; AREDS age related eye disease study; ARMD age related macular degeneration; POAG primary open angle glaucoma; EBMD epithelial/anterior basement membrane dystrophy; ACIOL anterior chamber intraocular lens; IOL intraocular lens; PCIOL posterior chamber intraocular lens; Phaco/IOL phacoemulsification with intraocular lens placement; [place] photorefractive keratectomy; LASIK laser assisted in situ keratomileusis; HTN hypertension; DM diabetes mellitus; COPD chronic obstructive pulmonary disease

## 2019-09-22 NOTE — Assessment & Plan Note (Signed)
This condition related to sarcoidosis, chronic and has not been recurrent for some time still using topical prednisolone acetate 1 drop left eye every other day

## 2019-09-22 NOTE — Assessment & Plan Note (Signed)
Recent visit with Dr. Dionne Ano of [facility].  Patient reports "everything looks great" stable.  Continue with glaucoma monitoring under his care

## 2019-09-22 NOTE — Assessment & Plan Note (Signed)
This condition is quiesced sent.

## 2019-11-01 DIAGNOSIS — M1990 Unspecified osteoarthritis, unspecified site: Secondary | ICD-10-CM | POA: Insufficient documentation

## 2020-03-29 ENCOUNTER — Other Ambulatory Visit: Payer: Self-pay

## 2020-03-29 ENCOUNTER — Encounter (INDEPENDENT_AMBULATORY_CARE_PROVIDER_SITE_OTHER): Payer: Self-pay | Admitting: Ophthalmology

## 2020-03-29 ENCOUNTER — Ambulatory Visit (INDEPENDENT_AMBULATORY_CARE_PROVIDER_SITE_OTHER): Payer: Medicare Other | Admitting: Ophthalmology

## 2020-03-29 DIAGNOSIS — D8683 Sarcoid iridocyclitis: Secondary | ICD-10-CM

## 2020-03-29 DIAGNOSIS — H2012 Chronic iridocyclitis, left eye: Secondary | ICD-10-CM | POA: Diagnosis not present

## 2020-03-29 DIAGNOSIS — H401123 Primary open-angle glaucoma, left eye, severe stage: Secondary | ICD-10-CM | POA: Diagnosis not present

## 2020-03-29 DIAGNOSIS — H35352 Cystoid macular degeneration, left eye: Secondary | ICD-10-CM | POA: Diagnosis not present

## 2020-03-29 DIAGNOSIS — H401111 Primary open-angle glaucoma, right eye, mild stage: Secondary | ICD-10-CM

## 2020-03-29 MED ORDER — PREDNISOLONE ACETATE 1 % OP SUSP: 1.0000 [drp] | OPHTHALMIC | 6 refills | Status: DC

## 2020-03-29 NOTE — Assessment & Plan Note (Signed)
No signs of activity at this time

## 2020-03-29 NOTE — Assessment & Plan Note (Signed)
Currently resolved and inactive

## 2020-03-29 NOTE — Assessment & Plan Note (Signed)
Ongoing monitoring and therapy as per Dr. Dionne Ano, [place]

## 2020-03-29 NOTE — Progress Notes (Signed)
03/29/2020     CHIEF COMPLAINT Patient presents for Retina Follow Up   HISTORY OF PRESENT ILLNESS: Sabrina Eaton is a 79 y.o. female who presents to the clinic today for:   HPI    Retina Follow Up    Patient presents with  Other.  In both eyes.  This started 6 months ago.  Severity is mild.  Duration of 6 months.  Since onset it is stable.          Comments    6 Month F/U OU  Pt denies noticeable changes to [place] OU since last visit. Pt denies ocular pain, flashes of light, or floaters OU. Pt need refill of prednisolone.   Patient continues on prednisolone acetate 1% 1 drop OS every other day        Last edited by Hurman Horn, MD on 03/29/2020  9:23 AM. (History)      Referring physician: Dian Queen, MD [address],   [id number]  HISTORICAL INFORMATION:   Selected notes from the MEDICAL RECORD NUMBER       CURRENT MEDICATIONS: Current Outpatient Medications (Ophthalmic Drugs)  Medication Sig  . brimonidine (ALPHAGAN) 0.2 % ophthalmic solution Place 1 drop into the left eye in the morning and at bedtime.  . dorzolamide (TRUSOPT) 2 % ophthalmic solution Place 1 drop into the left eye 2 (two) times daily.  . prednisoLONE acetate (PREDNISOLONE ACETATE P-F) 1 % ophthalmic suspension Place 1 drop into the left eye 4 (four) times daily.  . timolol (BETIMOL) 0.5 % ophthalmic solution Place 1 drop into both eyes 2 (two) times daily.   No current facility-administered medications for this visit. (Ophthalmic Drugs)   Current Outpatient Medications (Other)  Medication Sig  . acetaminophen (TYLENOL) 500 MG tablet Take 500 mg by mouth every 6 (six) hours as needed for mild pain.  . citalopram (CELEXA) 20 MG tablet Take 20 mg by mouth daily.  Marland Kitchen doxylamine, Sleep, (UNISOM) 25 MG tablet Take 25 mg by mouth at bedtime.  Marland Kitchen ezetimibe-simvastatin (VYTORIN) 10-40 MG tablet Take 1 tablet by mouth daily.  Marland Kitchen HYDROcodone-acetaminophen (NORCO) 5-325 MG  tablet Take 1-2 tablets by mouth every 6 (six) hours as needed.  . metFORMIN (GLUCOPHAGE) 500 MG tablet Take 500 mg by mouth in the morning, at noon, and at bedtime.  . metoprolol tartrate (LOPRESSOR) 50 MG tablet Take 50 mg by mouth daily.  . Multiple Vitamins-Minerals (CENTRUM VITAMINTS PO) Take 1 tablet by mouth daily.  . niacin (NIASPAN) 1000 MG CR tablet Take 1,000 mg by mouth daily.  . Omega-3 Fatty Acids (FISH OIL) 1000 MG CAPS Take 1 capsule by mouth in the morning, at noon, and at bedtime.  . ondansetron (ZOFRAN ODT) 4 MG disintegrating tablet Take 1 tablet (4 mg total) by mouth every 8 (eight) hours as needed.  Marland Kitchen oxyCODONE (ROXICODONE) 5 MG immediate release tablet Take 1 tablet (5 mg total) by mouth every 8 (eight) hours as needed.   No current facility-administered medications for this visit. (Other)      REVIEW OF SYSTEMS:    ALLERGIES No Known Allergies  PAST MEDICAL HISTORY Past Medical History:  Diagnosis Date  . Arthritis   . Asthma due to seasonal allergies   . Cancer ([place])    skin cancer - face - basal  . Diabetes mellitus without complication ([facility])    Type II  . Environmental allergies   . History of kidney stones  1 small in each kidney - has had for years  . Hypertension   . Right clavicle fracture   . Sarcoidosis    effecting the eyes   Past Surgical History:  Procedure Laterality Date  . BRONCHOSCOPY      x 2 to determine if she has scarcoidosis  . COLONOSCOPY W/ POLYPECTOMY    . ORIF CLAVICULAR FRACTURE Right 07/12/2019   Procedure: OPEN REDUCTION INTERNAL FIXATION (ORIF) CLAVICULAR FRACTURE;  Surgeon: Nicholes Stairs, MD;  Location: [facility];  Service: Orthopedics;  Laterality: Right;    FAMILY HISTORY History reviewed. No pertinent family history.  SOCIAL HISTORY Social History   Tobacco Use  . Smoking status: Current Some Day Smoker    Packs/day: 1.00  . Smokeless tobacco: Never Used  Vaping Use  . Vaping Use: Never used   Substance Use Topics  . Alcohol use: Not Currently  . Drug use: Not Currently         OPHTHALMIC EXAM:  Base Eye Exam    Visual Acuity (ETDRS)      Right Left   Dist cc 20/20 20/70 +1   Dist ph cc  20/50 +2   Correction: Glasses       Tonometry (Tonopen, 8:18 AM)      Right Left   Pressure 10 10       Pupils      Pupils Dark Light Shape React APD   Right PERRL 3 3 Round Minimal None   Left PERRL 3 3 Round Minimal None       Visual Fields (Counting fingers)      Left Right    Full Full       Extraocular Movement      Right Left    Full Full       Neuro/Psych    Oriented x3: Yes   Mood/Affect: Normal       Dilation    Both eyes: 1.0% Mydriacyl, 2.5% Phenylephrine @ 8:22 AM        Slit Lamp and Fundus Exam    External Exam      Right Left   External Normal Normal       Slit Lamp Exam      Right Left   Lids/Lashes Normal Normal   Conjunctiva/Sclera White and quiet White and quiet   Cornea Clear Clear, no kp   Anterior Chamber Deep and quiet Deep and quiet   Iris Round and reactive Round and reactive   Lens Posterior chamber intraocular lens Posterior chamber intraocular lens   Anterior Vitreous Normal Normal       Fundus Exam      Right Left   Posterior Vitreous Normal Normal   Disc Normal  notch inferior   C/D Ratio 0.6 0.85   Macula Normal Hard drusen   Vessels Normal,  , no DR Normal,  , no DR   Periphery Normal Old peripheral Punched-out chorioretinal scars          IMAGING AND PROCEDURES  Imaging and Procedures for 03/29/20  OCT, Retina - OU - Both Eyes       Right Eye Quality was good. Scan locations included subfoveal. Central Foveal Thickness: 233. Progression has been stable. Findings include normal foveal contour.   Left Eye Quality was good. Scan locations included subfoveal. Central Foveal Thickness: 1989. Progression has been stable. Findings include abnormal foveal contour, central retinal atrophy, outer retinal  atrophy, inner retinal atrophy.   Notes No active maculopathy in  either eye  OS with diffuse macular atrophy noted  Stable over time                ASSESSMENT/PLAN:  Chronic iridocyclitis of left eye No signs of activity at this time  Cystoid macular edema of left eye Currently resolved and inactive  Primary open angle glaucoma of left eye, severe stage Ongoing monitoring and therapy as per Dr. Dionne Ano, [place]  Primary open angle glaucoma of right eye, mild stage Under the care of Dr. Dionne Ano      ICD-10-CM   1. Cystoid macular edema of left eye  H35.352 OCT, Retina - OU - Both Eyes  2. Chronic iridocyclitis of left eye  H20.12   3. 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 Primary open angle glaucoma of left eye, severe stage  H40.1123   5. Primary open angle glaucoma of right eye, mild stage  H40.1111     1.  2.  3.  Ophthalmic Meds Ordered this visit:  No orders of the defined types were placed in this encounter.      Return in about 6 months (around 09/27/2020) for COLOR FP, DILATE OU, OCT.  There are no Patient Instructions on file for this visit.   Explained the diagnoses, plan, and follow up with the patient and they expressed understanding.  Patient expressed understanding of the importance of proper follow up care.   Clent Demark Avalon Coppinger M.D. Diseases & Surgery of the Retina and Vitreous Retina & Diabetic [place] 03/29/20     Abbreviations: M myopia (nearsighted); A astigmatism; H hyperopia (farsighted); P presbyopia; Mrx spectacle prescription;  CTL contact lenses; OD right eye; OS left eye; OU both eyes  XT exotropia; ET esotropia; PEK punctate epithelial keratitis; PEE punctate epithelial erosions; DES dry eye syndrome; MGD meibomian gland dysfunction; ATs artificial tears; PFAT's preservative free artificial tears; [place] nuclear sclerotic cataract; PSC posterior subcapsular cataract; ERM epi-retinal membrane; PVD posterior  vitreous detachment; RD retinal detachment; DM diabetes mellitus; DR diabetic retinopathy; NPDR non-proliferative diabetic retinopathy; PDR proliferative diabetic retinopathy; CSME clinically significant macular edema; DME diabetic macular edema; dbh dot blot hemorrhages; CWS cotton wool spot; POAG primary open angle glaucoma; C/D cup-to-disc ratio; HVF humphrey visual field; GVF goldmann visual field; OCT optical coherence tomography; IOP intraocular pressure; BRVO Branch retinal vein occlusion; CRVO central retinal vein occlusion; CRAO central retinal artery occlusion; BRAO branch retinal artery occlusion; RT retinal tear; SB scleral buckle; PPV pars plana vitrectomy; VH Vitreous hemorrhage; PRP panretinal laser photocoagulation; IVK intravitreal kenalog; VMT vitreomacular traction; MH Macular hole;  NVD neovascularization of the disc; NVE neovascularization elsewhere; AREDS age related eye disease study; ARMD age related macular degeneration; POAG primary open angle glaucoma; EBMD epithelial/anterior basement membrane dystrophy; ACIOL anterior chamber intraocular lens; IOL intraocular lens; PCIOL posterior chamber intraocular lens; Phaco/IOL phacoemulsification with intraocular lens placement; [place] photorefractive keratectomy; LASIK laser assisted in situ keratomileusis; HTN hypertension; DM diabetes mellitus; COPD chronic obstructive pulmonary disease

## 2020-03-29 NOTE — Assessment & Plan Note (Signed)
Under the care of Dr. Dionne Ano

## 2020-09-27 ENCOUNTER — Encounter (INDEPENDENT_AMBULATORY_CARE_PROVIDER_SITE_OTHER): Payer: Medicare Other | Admitting: Ophthalmology

## 2020-10-09 ENCOUNTER — Ambulatory Visit (INDEPENDENT_AMBULATORY_CARE_PROVIDER_SITE_OTHER): Payer: Medicare Other | Admitting: Ophthalmology

## 2020-10-09 ENCOUNTER — Encounter (INDEPENDENT_AMBULATORY_CARE_PROVIDER_SITE_OTHER): Payer: Self-pay | Admitting: Ophthalmology

## 2020-10-09 ENCOUNTER — Other Ambulatory Visit: Payer: Self-pay

## 2020-10-09 DIAGNOSIS — D8683 Sarcoid iridocyclitis: Secondary | ICD-10-CM | POA: Diagnosis not present

## 2020-10-09 DIAGNOSIS — E119 Type 2 diabetes mellitus without complications: Secondary | ICD-10-CM | POA: Diagnosis not present

## 2020-10-09 DIAGNOSIS — H401111 Primary open-angle glaucoma, right eye, mild stage: Secondary | ICD-10-CM

## 2020-10-09 DIAGNOSIS — H35352 Cystoid macular degeneration, left eye: Secondary | ICD-10-CM | POA: Diagnosis not present

## 2020-10-09 DIAGNOSIS — H2012 Chronic iridocyclitis, left eye: Secondary | ICD-10-CM | POA: Diagnosis not present

## 2020-10-09 DIAGNOSIS — H401123 Primary open-angle glaucoma, left eye, severe stage: Secondary | ICD-10-CM

## 2020-10-09 NOTE — Progress Notes (Signed)
10/09/2020     CHIEF COMPLAINT Patient presents for Retina Follow Up (6 month fu OU and OCT/FP/Pt states, "My vision in my left eye seems to be getting worse. I was at Dr. Delfin Gant yesterday and he had me get AT to relieve the discomfort of feeling like something was in my eye but I also feel like I have a film over my eye. Like I cannot see through a cloud."/Pt reports using Brimonidine BID OS, Timolol BID OU, Trusopt BID OS and Pred Acetate EOD OS./A1C:5.6/LBS:I dont know but it was normal)   HISTORY OF PRESENT ILLNESS: Sabrina Eaton is a 80 y.o. female who presents to the clinic today for:   HPI     Retina Follow Up           Diagnosis: Other   Laterality: both eyes   Onset: 6 months ago   Severity: mild   Duration: 6 months   Course: stable   Comments: 6 month fu OU and OCT/FP Pt states, "My vision in my left eye seems to be getting worse. I was at Dr. Delfin Gant yesterday and he had me get AT to relieve the discomfort of feeling like something was in my eye but I also feel like I have a film over my eye. Like I cannot see through a cloud." Pt reports using Brimonidine BID OS, Timolol BID OU, Trusopt BID OS and Pred Acetate EOD OS. A1C:5.6 LBS:I dont know but it was normal       Last edited by Kendra Opitz, COA on 10/09/2020  8:12 AM.      Referring physician: Dian Queen, MD [address],  [place] [address]  HISTORICAL INFORMATION:   Selected notes from the MEDICAL RECORD NUMBER       CURRENT MEDICATIONS: Current Outpatient Medications (Ophthalmic Drugs)  Medication Sig   brimonidine (ALPHAGAN) 0.2 % ophthalmic solution Place 1 drop into the left eye in the morning and at bedtime.   dorzolamide (TRUSOPT) 2 % ophthalmic solution Place 1 drop into the left eye 2 (two) times daily.   prednisoLONE acetate (PREDNISOLONE ACETATE P-F) 1 % ophthalmic suspension Place 1 drop into the left eye every other day.   timolol (BETIMOL) 0.5 % ophthalmic  solution Place 1 drop into both eyes 2 (two) times daily.   No current facility-administered medications for this visit. (Ophthalmic Drugs)   Current Outpatient Medications (Other)  Medication Sig   acetaminophen (TYLENOL) 500 MG tablet Take 500 mg by mouth every 6 (six) hours as needed for mild pain.   citalopram (CELEXA) 20 MG tablet Take 20 mg by mouth daily.   doxylamine, Sleep, (UNISOM) 25 MG tablet Take 25 mg by mouth at bedtime.   ezetimibe-simvastatin (VYTORIN) 10-40 MG tablet Take 1 tablet by mouth daily.   HYDROcodone-acetaminophen (NORCO) 5-325 MG tablet Take 1-2 tablets by mouth every 6 (six) hours as needed.   metFORMIN (GLUCOPHAGE) 500 MG tablet Take 500 mg by mouth in the morning, at noon, and at bedtime.   metoprolol tartrate (LOPRESSOR) 50 MG tablet Take 50 mg by mouth daily.   Multiple Vitamins-Minerals (CENTRUM VITAMINTS PO) Take 1 tablet by mouth daily.   niacin (NIASPAN) 1000 MG CR tablet Take 1,000 mg by mouth daily.   Omega-3 Fatty Acids (FISH OIL) 1000 MG CAPS Take 1 capsule by mouth in the morning, at noon, and at bedtime.   ondansetron (ZOFRAN ODT) 4 MG disintegrating tablet Take 1 tablet (4 mg total)  by mouth every 8 (eight) hours as needed.   No current facility-administered medications for this visit. (Other)      REVIEW OF SYSTEMS:    ALLERGIES No Known Allergies  PAST MEDICAL HISTORY Past Medical History:  Diagnosis Date   Arthritis    Asthma due to seasonal allergies    Cancer ([place])    skin cancer - face - basal   Diabetes mellitus without complication ([place])    Type II   Environmental allergies    History of kidney stones    1 small in each kidney - has had for years   Hypertension    Right clavicle fracture    Sarcoidosis    effecting the eyes   Past Surgical History:  Procedure Laterality Date   BRONCHOSCOPY      x 2 to determine if she has scarcoidosis   COLONOSCOPY W/ POLYPECTOMY     ORIF CLAVICULAR FRACTURE Right 07/12/2019    Procedure: OPEN REDUCTION INTERNAL FIXATION (ORIF) CLAVICULAR FRACTURE;  Surgeon: Nicholes Stairs, MD;  Location: [facility];  Service: Orthopedics;  Laterality: Right;    FAMILY HISTORY History reviewed. No pertinent family history.  SOCIAL HISTORY Social History   Tobacco Use   Smoking status: Some Days    Packs/day: 1.00    Pack years: 0.00    Types: Cigarettes   Smokeless tobacco: Never  Vaping Use   Vaping Use: Never used  Substance Use Topics   Alcohol use: Not Currently   Drug use: Not Currently         OPHTHALMIC EXAM:  Base Eye Exam     Visual Acuity (ETDRS)       Right Left   Dist cc 20/25 +2 20/80 -2   Dist ph cc  20/60 -1         Tonometry (Tonopen, 8:16 AM)       Right Left   Pressure 12 11         Pupils       Pupils Dark Light Shape React APD   Right PERRL 3 3 Round Minimal None   Left PERRL 3 3 Round Minimal None         Visual Fields (Counting fingers)       Left Right    Full Full         Extraocular Movement       Right Left    Full Full         Neuro/Psych     Oriented x3: Yes   Mood/Affect: Normal         Dilation     Both eyes: 1.0% Mydriacyl, 2.5% Phenylephrine @ 8:16 AM           Slit Lamp and Fundus Exam     External Exam       Right Left   External Normal Normal         Slit Lamp Exam       Right Left   Lids/Lashes Normal Normal   Conjunctiva/Sclera White and quiet White and quiet   Cornea Clear Clear, no kp   Anterior Chamber Deep and quiet Deep and quiet   Iris Round and reactive Round and reactive   Lens Posterior chamber intraocular lens Posterior chamber intraocular lens   Anterior Vitreous Normal Normal         Fundus Exam       Right Left   Posterior Vitreous Normal Normal   Disc Normal  notch  inferior   C/D Ratio 0.6 0.85   Macula Normal Hard drusen   Vessels Normal,  , no DR Normal,  , no DR   Periphery Normal 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 Scan locations included subfoveal. Central Foveal Thickness: 230. Progression has been stable. Findings include normal foveal contour.   Left Eye Quality was good. Scan locations included subfoveal. Central Foveal Thickness: 195. Progression has been stable. Findings include abnormal foveal contour, central retinal atrophy, outer retinal atrophy, inner retinal atrophy.   Notes No active maculopathy in either eye  OS with diffuse macular atrophy noted  Stable over time     Color Fundus Photography Optos - OU - Both Eyes       Right Eye Progression has been stable. Disc findings include normal observations, increased cup to disc ratio. Macula : normal observations. Vessels : normal observations. Periphery : normal observations.   Left Eye Progression has been stable. Disc findings include increased cup to disc ratio.   Notes OD with mild open-angle glaucoma  OS with advanced secondary glaucoma from previous sarcoid uveitis, recurrences.  Peripheral punched-out chorioretinal scars approximately 450 to 12 m in size peripherally, evidence of retinal nonperfusion with white lines of nonperfusion present temporally but no active inflammation currently             ASSESSMENT/PLAN:  Chronic iridocyclitis of left eye Quiescent at this time  Cystoid macular edema of left eye No active CME OS at this time  Diabetes mellitus without complication (HCC) No diabetic retinopathy OU  Sarcoid uveitis of left eye Currently on q. OD steroid drop, Prednisolone acetate 1% now for years with a history of recurrences off medication  Primary open angle glaucoma of right eye, mild stage Continue on topical atenolol as per Dr. 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  Also with a chronic need of q. OD steroid exacerbation.  On 3 medications topically.  IOP satisfactory  Follow-up Dr. Edilia Bo as scheduled     ICD-10-CM   1. Cystoid macular edema of left eye  H35.352 OCT, Retina - OU - Both Eyes    Color Fundus Photography Optos - OU - Both Eyes    2. Chronic iridocyclitis of left eye  H20.12     3. Diabetes mellitus without complication ([place])  Z99.3     4. Sarcoid uveitis of left eye  D86.83 OCT, Retina - OU - Both Eyes    5. Primary open angle glaucoma of right eye, mild stage  H40.1111     6. Primary open angle glaucoma of left eye, severe stage  H40.1123       1.  2.  3.  Ophthalmic Meds Ordered this visit:  No orders of the defined types were placed in this encounter.      Return in about 6 months (around 04/10/2021) for DILATE OU, COLOR FP, OCT.  There are no Patient Instructions on file for this visit.   Explained the diagnoses, plan, and follow up with the patient and they expressed understanding.  Patient expressed understanding of the importance of proper follow up care.   Clent Demark Maycen Degregory M.D. Diseases & Surgery of the Retina  and Vitreous [facility]ina [facility]abetic [place] 10/09/20     Abbreviations: M myopia (nearsighted); A astigmatism; H hyperopia (farsighted); P presbyopia; Mrx spectacle prescription;  CTL contact lenses; OD right eye; OS left eye; OU both eyes  XT exotropia; ET esotropia; PEK punctate epithelial keratitis; PEE punctate epithelial erosions; DES dry eye syndrome; MGD meibomian gland dysfunction; ATs artificial tears; PFAT's preservative free artificial tears; [place] nuclear sclerotic cataract; PSC posterior subcapsular cataract; ERM epi-retinal membrane; PVD posterior vitreous detachment; RD retinal detachment; DM diabetes mellitus; DR diabetic retinopathy; NPDR non-proliferative diabetic retinopathy; PDR proliferative diabetic retinopathy; CSME clinically significant macular edema; DME  diabetic macular edema; dbh dot blot hemorrhages; CWS cotton wool spot; POAG primary open angle glaucoma; C/D cup-to-disc ratio; HVF humphrey visual field; GVF goldmann visual field; OCT optical coherence tomography; IOP intraocular pressure; BRVO Branch retinal vein occlusion; CRVO central retinal vein occlusion; CRAO central retinal artery occlusion; BRAO branch retinal artery occlusion; RT retinal tear; SB scleral buckle; PPV pars plana vitrectomy; VH Vitreous hemorrhage; PRP panretinal laser photocoagulation; IVK intravitreal kenalog; VMT vitreomacular traction; MH Macular hole;  NVD neovascularization of the disc; NVE neovascularization elsewhere; AREDS age related eye disease study; ARMD age related macular degeneration; POAG primary open angle glaucoma; EBMD epithelial/anterior basement membrane dystrophy; ACIOL anterior chamber intraocular lens; IOL intraocular lens; PCIOL posterior chamber intraocular lens; Phaco/IOL phacoemulsification with intraocular lens placement; [place] photorefractive keratectomy; LASIK laser assisted in situ keratomileusis; HTN hypertension; DM diabetes mellitus; COPD chronic obstructive pulmonary disease

## 2020-10-09 NOTE — Assessment & Plan Note (Signed)
Currently on q. OD steroid drop, Prednisolone acetate 1% now for years with a history of recurrences off medication

## 2020-10-09 NOTE — Assessment & Plan Note (Signed)
Continue on topical atenolol as per Dr. Dionne Ano

## 2020-10-09 NOTE — Assessment & Plan Note (Signed)
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  Also with a chronic need of q. OD steroid exacerbation.  On 3 medications topically.  IOP satisfactory  Follow-up Dr. Edilia Bo as scheduled

## 2020-10-09 NOTE — Assessment & Plan Note (Signed)
Quiescent at this time

## 2020-10-09 NOTE — Assessment & Plan Note (Signed)
No diabetic retinopathy OU.

## 2020-10-09 NOTE — Assessment & Plan Note (Signed)
No active CME OS at this time

## 2020-12-05 ENCOUNTER — Telehealth: Payer: Self-pay | Admitting: *Deleted

## 2020-12-05 NOTE — Telephone Encounter (Signed)
Per referral Dr. Helane Rima - patient called to confirm appointment with Dr. Marin Olp on 12/10/20 @ 2:00 p.m.

## 2020-12-10 ENCOUNTER — Inpatient Hospital Stay: Payer: Medicare Other | Attending: Hematology & Oncology

## 2020-12-10 ENCOUNTER — Other Ambulatory Visit: Payer: Self-pay

## 2020-12-10 ENCOUNTER — Encounter: Payer: Self-pay | Admitting: Hematology & Oncology

## 2020-12-10 ENCOUNTER — Inpatient Hospital Stay: Payer: Medicare Other | Admitting: Hematology & Oncology

## 2020-12-10 VITALS — BP 145/75 | HR 88 | Temp 98.9°F | Resp 16 | Ht 62.0 in | Wt 105.0 lb

## 2020-12-10 DIAGNOSIS — F17210 Nicotine dependence, cigarettes, uncomplicated: Secondary | ICD-10-CM | POA: Diagnosis not present

## 2020-12-10 DIAGNOSIS — Z79899 Other long term (current) drug therapy: Secondary | ICD-10-CM | POA: Diagnosis not present

## 2020-12-10 DIAGNOSIS — E119 Type 2 diabetes mellitus without complications: Secondary | ICD-10-CM

## 2020-12-10 DIAGNOSIS — D72823 Leukemoid reaction: Secondary | ICD-10-CM

## 2020-12-10 DIAGNOSIS — Z7984 Long term (current) use of oral hypoglycemic drugs: Secondary | ICD-10-CM

## 2020-12-10 DIAGNOSIS — D8689 Sarcoidosis of other sites: Secondary | ICD-10-CM

## 2020-12-10 DIAGNOSIS — D72829 Elevated white blood cell count, unspecified: Secondary | ICD-10-CM

## 2020-12-10 LAB — CBC WITH DIFFERENTIAL (CANCER CENTER ONLY)
Abs Immature Granulocytes: 0.04 10*3/uL (ref 0.00–0.07)
Basophils Absolute: 0.1 10*3/uL (ref 0.0–0.1)
Basophils Relative: 1 %
Eosinophils Absolute: 0.5 10*3/uL (ref 0.0–0.5)
Eosinophils Relative: 4 %
HCT: 40.9 % (ref 36.0–46.0)
Hemoglobin: 13.2 g/dL (ref 12.0–15.0)
Immature Granulocytes: 0 %
Lymphocytes Relative: 19 %
Lymphs Abs: 2.3 10*3/uL (ref 0.7–4.0)
MCH: 31.6 pg (ref 26.0–34.0)
MCHC: 32.3 g/dL (ref 30.0–36.0)
MCV: 97.8 fL (ref 80.0–100.0)
Monocytes Absolute: 1 10*3/uL (ref 0.1–1.0)
Monocytes Relative: 8 %
Neutro Abs: 8.2 10*3/uL — ABNORMAL HIGH (ref 1.7–7.7)
Neutrophils Relative %: 68 %
Platelet Count: 244 10*3/uL (ref 150–400)
RBC: 4.18 MIL/uL (ref 3.87–5.11)
RDW: 13.7 % (ref 11.5–15.5)
WBC Count: 12.1 10*3/uL — ABNORMAL HIGH (ref 4.0–10.5)
nRBC: 0 % (ref 0.0–0.2)

## 2020-12-10 LAB — SAVE SMEAR(SSMR), FOR PROVIDER SLIDE REVIEW

## 2020-12-10 NOTE — Progress Notes (Signed)
Referral MD  Reason for Referral: Leukocytosis-leukemoid reaction  Chief Complaint  Patient presents with   New Patient (Initial Visit)  : My white cell count has been high.  HPI: Sabrina Eaton the very charming 80 year old white female.  She is originally from [place], [place].  She has been in [facility] for most of her life.  She was a Pharmacist, hospital.  She now is working for a [employer].  She comes in with her son.  She is followed by Dr. Helane Rima.  Dr. Helane Rima has noted that there has been some leukocytosis.  I do not have a lot of notes a lot of labs that were sent over.  I went to the computer system.  The only lab that I see was back in March 2021.  At that time, she had a white count 14.1.  Hemoglobin 14.5.  Platelet count 292,000.  Sabrina Eaton has incredibly interesting history.  She has ocular sarcoidosis.  She was diagnosed probably 20 years ago.  She is on steroid eyedrops.  She has had really no past surgery aside of that for her tonsils and a broken collarbone.  Does have diabetes.  This may been from taking steroids for the sarcoidosis.  She is up-to-date with her mammogram.  She has this yearly.  She has had her last colonoscopy was a couple years ago.  She has had no problem with infections.  She has had no issues with COVID.  There is been no problems with rashes.  She has had no swollen lymph nodes.  She has had no weight loss or weight gain.  Her appetite has been good.  She is not a vegetarian..  She does not smoke.  I think she has rare alcohol use.  Currently, I would say performance status is probably ECOG 1.    Past Medical History:  Diagnosis Date   Arthritis    Asthma due to seasonal allergies    Cancer ([place])    skin cancer - face - basal   Diabetes mellitus without complication ([place])    Type II   Environmental allergies    History of kidney stones    1 small in each kidney - has had for years   Hypertension    Right clavicle fracture    Sarcoidosis     effecting the eyes  :   Past Surgical History:  Procedure Laterality Date   BRONCHOSCOPY      x 2 to determine if she has scarcoidosis   COLONOSCOPY W/ POLYPECTOMY     ORIF CLAVICULAR FRACTURE Right 07/12/2019   Procedure: OPEN REDUCTION INTERNAL FIXATION (ORIF) CLAVICULAR FRACTURE;  Surgeon: Nicholes Stairs, MD;  Location: [facility];  Service: Orthopedics;  Laterality: Right;  :   Current Outpatient Medications:    acetaminophen (TYLENOL) 500 MG tablet, Take 500 mg by mouth every 6 (six) hours as needed for mild pain., Disp: , Rfl:    brimonidine (ALPHAGAN) 0.2 % ophthalmic solution, Place 1 drop into the left eye in the morning and at bedtime., Disp: , Rfl:    citalopram (CELEXA) 20 MG tablet, Take 20 mg by mouth daily., Disp: , Rfl:    dorzolamide (TRUSOPT) 2 % ophthalmic solution, Place 1 drop into the left eye 2 (two) times daily., Disp: , Rfl:    doxylamine, Sleep, (UNISOM) 25 MG tablet, Take 25 mg by mouth at bedtime., Disp: , Rfl:    ezetimibe-simvastatin (VYTORIN) 10-40 MG tablet, Take 1 tablet by mouth daily., Disp: ,  Rfl:    HYDROcodone-acetaminophen (NORCO) 5-325 MG tablet, Take 1-2 tablets by mouth every 6 (six) hours as needed., Disp: 15 tablet, Rfl: 0   metFORMIN (GLUCOPHAGE) 500 MG tablet, Take 500 mg by mouth in the morning, at noon, and at bedtime., Disp: , Rfl:    metoprolol tartrate (LOPRESSOR) 50 MG tablet, Take 50 mg by mouth daily., Disp: , Rfl:    Multiple Vitamins-Minerals (CENTRUM VITAMINTS PO), Take 1 tablet by mouth daily., Disp: , Rfl:    niacin (NIASPAN) 1000 MG CR tablet, Take 1,000 mg by mouth daily., Disp: , Rfl:    Omega-3 Fatty Acids (FISH OIL) 1000 MG CAPS, Take 1 capsule by mouth in the morning, at noon, and at bedtime., Disp: , Rfl:    ondansetron (ZOFRAN ODT) 4 MG disintegrating tablet, Take 1 tablet (4 mg total) by mouth every 8 (eight) hours as needed., Disp: 20 tablet, Rfl: 0   Polyethyl Glycol-Propyl Glycol (SYSTANE HYDRATION PF OP), Systane  (PF), Disp: , Rfl:    prednisoLONE acetate (PREDNISOLONE ACETATE P-F) 1 % ophthalmic suspension, Place 1 drop into the left eye every other day., Disp: 5 mL, Rfl: 6   timolol (BETIMOL) 0.5 % ophthalmic solution, Place 1 drop into both eyes 2 (two) times daily., Disp: , Rfl: :  :  No Known Allergies:  History reviewed. No pertinent family history.:   Social History   Socioeconomic History   Marital status: Widowed    Spouse name: Not on file   Number of children: Not on file   Years of education: Not on file   Highest education level: Not on file  Occupational History   Not on file  Tobacco Use   Smoking status: Some Days    Packs/day: 1.00    Types: Cigarettes   Smokeless tobacco: Never  Vaping Use   Vaping Use: Never used  Substance and Sexual Activity   Alcohol use: Not Currently   Drug use: Not Currently   Sexual activity: Not on file  Other Topics Concern   Not on file  Social History Narrative   Not on file   Social Determinants of Health   Financial Resource Strain: Not on file  Food Insecurity: Not on file  Transportation Needs: Not on file  Physical Activity: Not on file  Stress: Not on file  Social Connections: Not on file  Intimate Partner Violence: Not on file  :  Review of Systems  Constitutional: Negative.   HENT: Negative.    Eyes: Negative.   Respiratory: Negative.    Cardiovascular: Negative.   Gastrointestinal: Negative.   Genitourinary: Negative.   Musculoskeletal: Negative.   Skin: Negative.   Neurological: Negative.   Endo/Heme/Allergies: Negative.   Psychiatric/Behavioral: Negative.      Exam:  '@IPVITALS'$ @ This is a petite white female in no obvious distress.  Vital signs are temperature of 98.9.  Pulse 88.  Blood pressure 145/75.  Weight is 105 pounds.  Head and neck exam shows no ocular or oral lesions.  Maybe a little bit of conjunctival inflammation in the left eye.  Extraocular muscles are intact.  Pupils react.  There is no  adenopathy in the neck or supraclavicular region.  Thyroid is nonpalpable.  Lungs are clear bilaterally.  Cardiac exam regular rate and rhythm with no murmurs, rubs or bruits.  Axillary exam shows no bilateral axillary adenopathy.  Abdomen is soft.  She has good bowel sounds.  There is no fluid wave.  There is no palpable liver or  spleen tip.  Back exam shows no tenderness over the spine, ribs or hips.  Extremities shows no clubbing, cyanosis or edema.  She has decent range of motion of her joints.  Skin exam shows no rashes, ecchymoses or petechia.  Neurological exam shows no focal neurological deficits.   Recent Labs    12/10/20 1336  WBC 12.1*  HGB 13.2  HCT 40.9  PLT 244   No results for input(s): NA, K, CL, CO2, GLUCOSE, BUN, CREATININE, CALCIUM in the last 72 hours.  Blood smear review: Normochromic and normocytic population of red blood cells.  There are no nucleated red blood cells.  I see no teardrop cells.  There is no schistocytes or spherocytes.  She has no rouleaux formation.  White blood cells were normal in morphology maturation.  I see no hypersegmented polys.  There is no smudge cells.  I see no atypical lymphocytes.  There are no blasts.  Platelets are adequate number and size.  Pathology: None    Assessment and Plan: Sabrina Eaton is a very charming 80 year old white female with mild leukocytosis.  She has had this for least a year and a half.  I have to believe this is all reactionary to her medications.  She is taking steroid for her sarcoidosis, this could certainly cause some mild leukocytosis.  Her blood smear is very reassuring.  The fact that her white cell differential is normal is also reassuring.  Sometimes, with patients who are "mature" as Sabrina Eaton, we can see CLL.  I do not see any indication for any flow cytometry.  There is certainly no indication for a bone marrow test.  I do not see an indication for scans.  Again she is very very nice.  She is very healthy.   I reassured her that I just did not see any problem with her blood.  I think her immune system is doing okay.  As nice as she is, I just do not see that she has to come back to see [place].  I just do not think that we are adding to her medical care.  Again we had a wonderful visit.  I gave her a prayer blanket.  She was very thankful for this.

## 2020-12-11 ENCOUNTER — Telehealth: Payer: Self-pay | Admitting: Hematology & Oncology

## 2020-12-11 NOTE — Telephone Encounter (Signed)
No los 8/15

## 2021-01-23 IMAGING — RF DG CLAVICLE*R*
1 series · 2 of 2 positions shown · non-contrast
Comparison: Radiograph 06/29/2019

CLINICAL DATA: Clavicle fixation

EXAM:
RIGHT CLAVICLE - 2+ VIEWS; DG C-ARM 1-60 MIN

[Series 1: unknown protocol · right · 0.14mm/px · 2 of 2 slices shown]
[im 1/2]
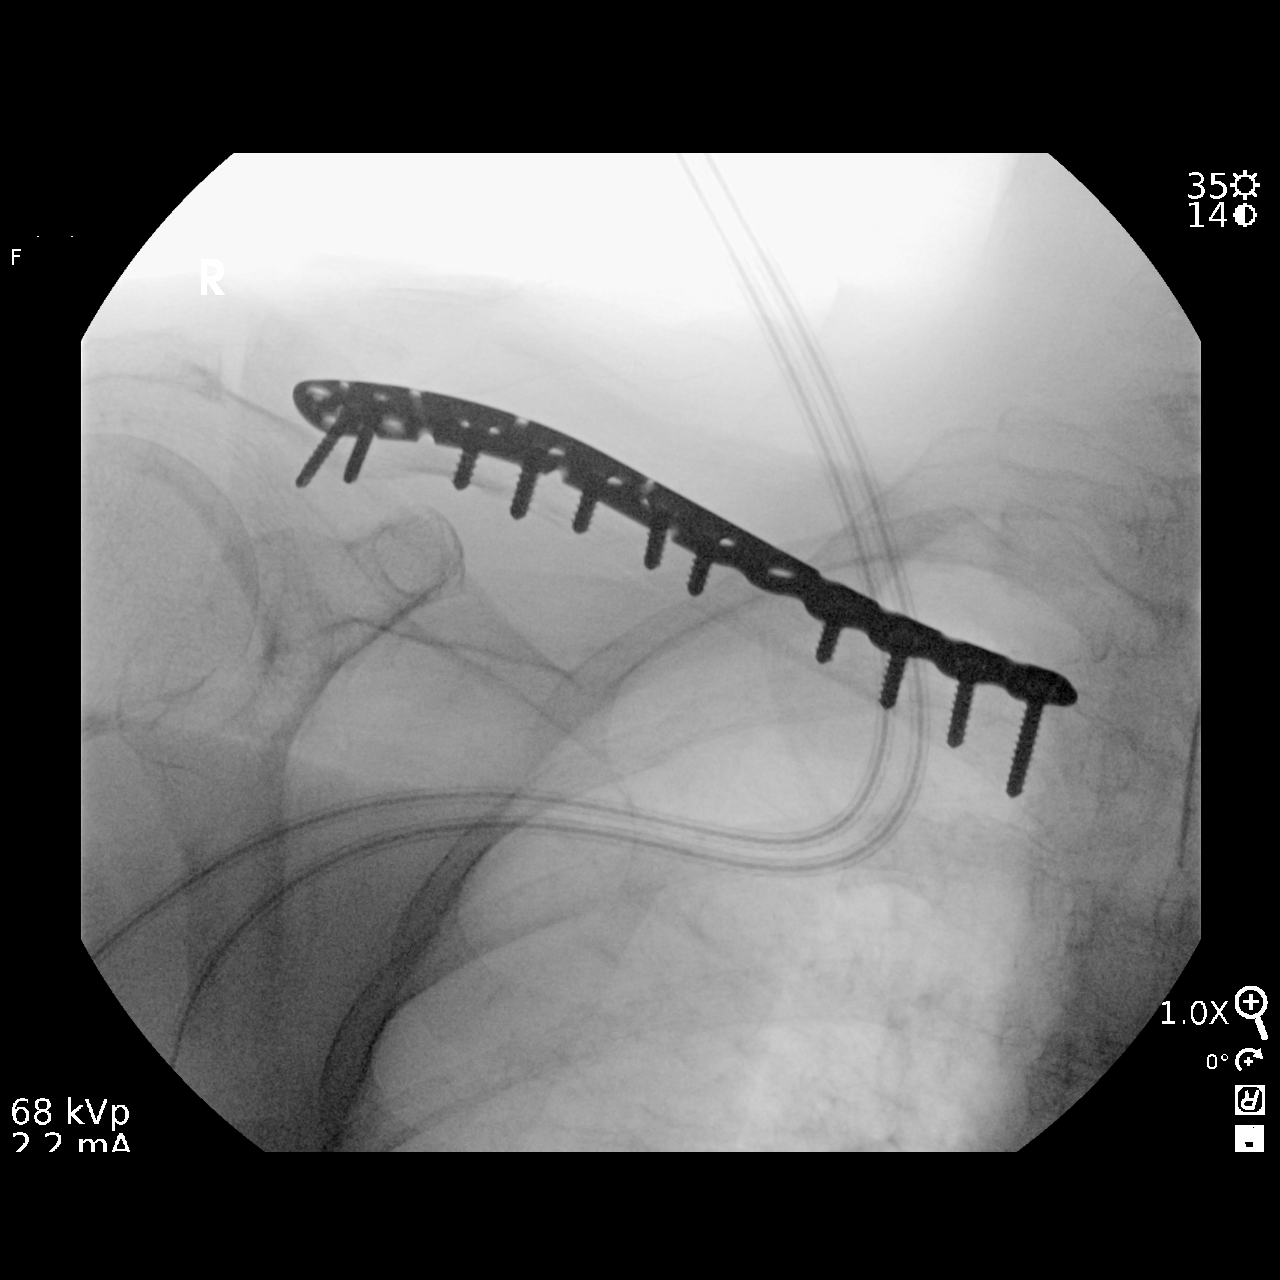
[im 2/2]
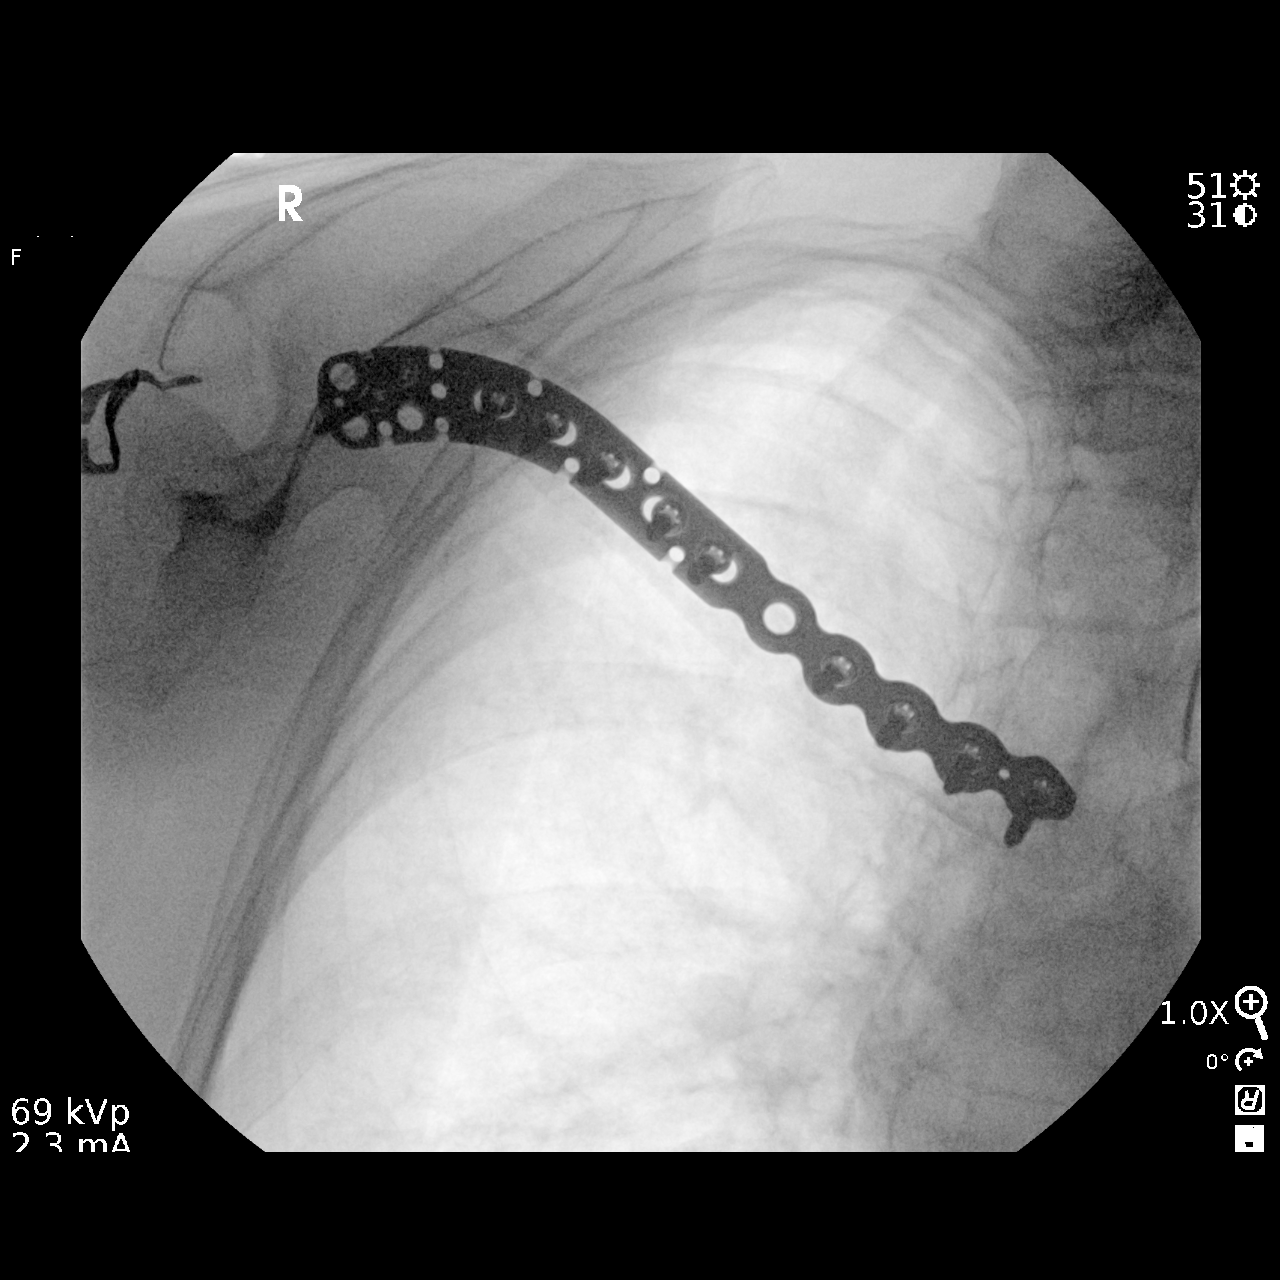

[2 of 2 positions shown; findings below may reference images not displayed]

FINDINGS: internal fixation of RIGHT clavicle fracture with dynamic plate and
multiple cortical screws. Normal alignment.
IMPRESSION: RIGHT clavicle internal fixation.  No complicating features

## 2021-01-23 IMAGING — RF DG C-ARM 1-60 MIN
1 series · 2 of 2 positions shown · non-contrast
Comparison: Radiograph 06/29/2019

CLINICAL DATA: Clavicle fixation

EXAM:
RIGHT CLAVICLE - 2+ VIEWS; DG C-ARM 1-60 MIN

[Series 1: unknown protocol · right · 0.14mm/px · 2 of 2 slices shown]
[im 1/2]
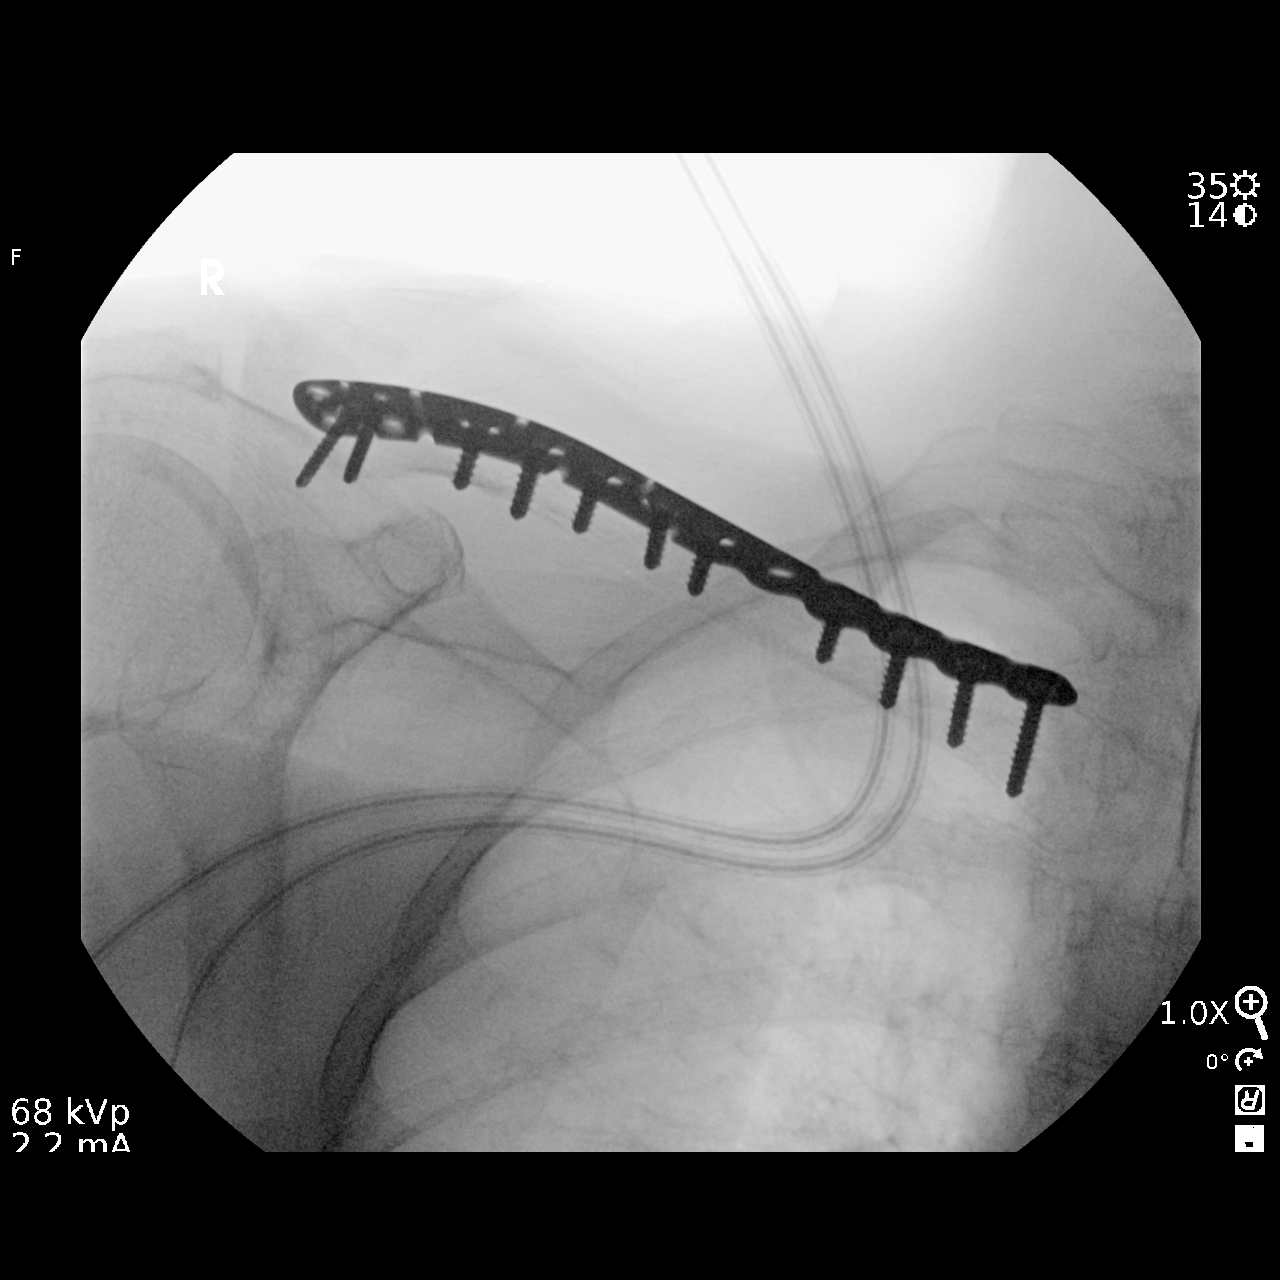
[im 2/2]
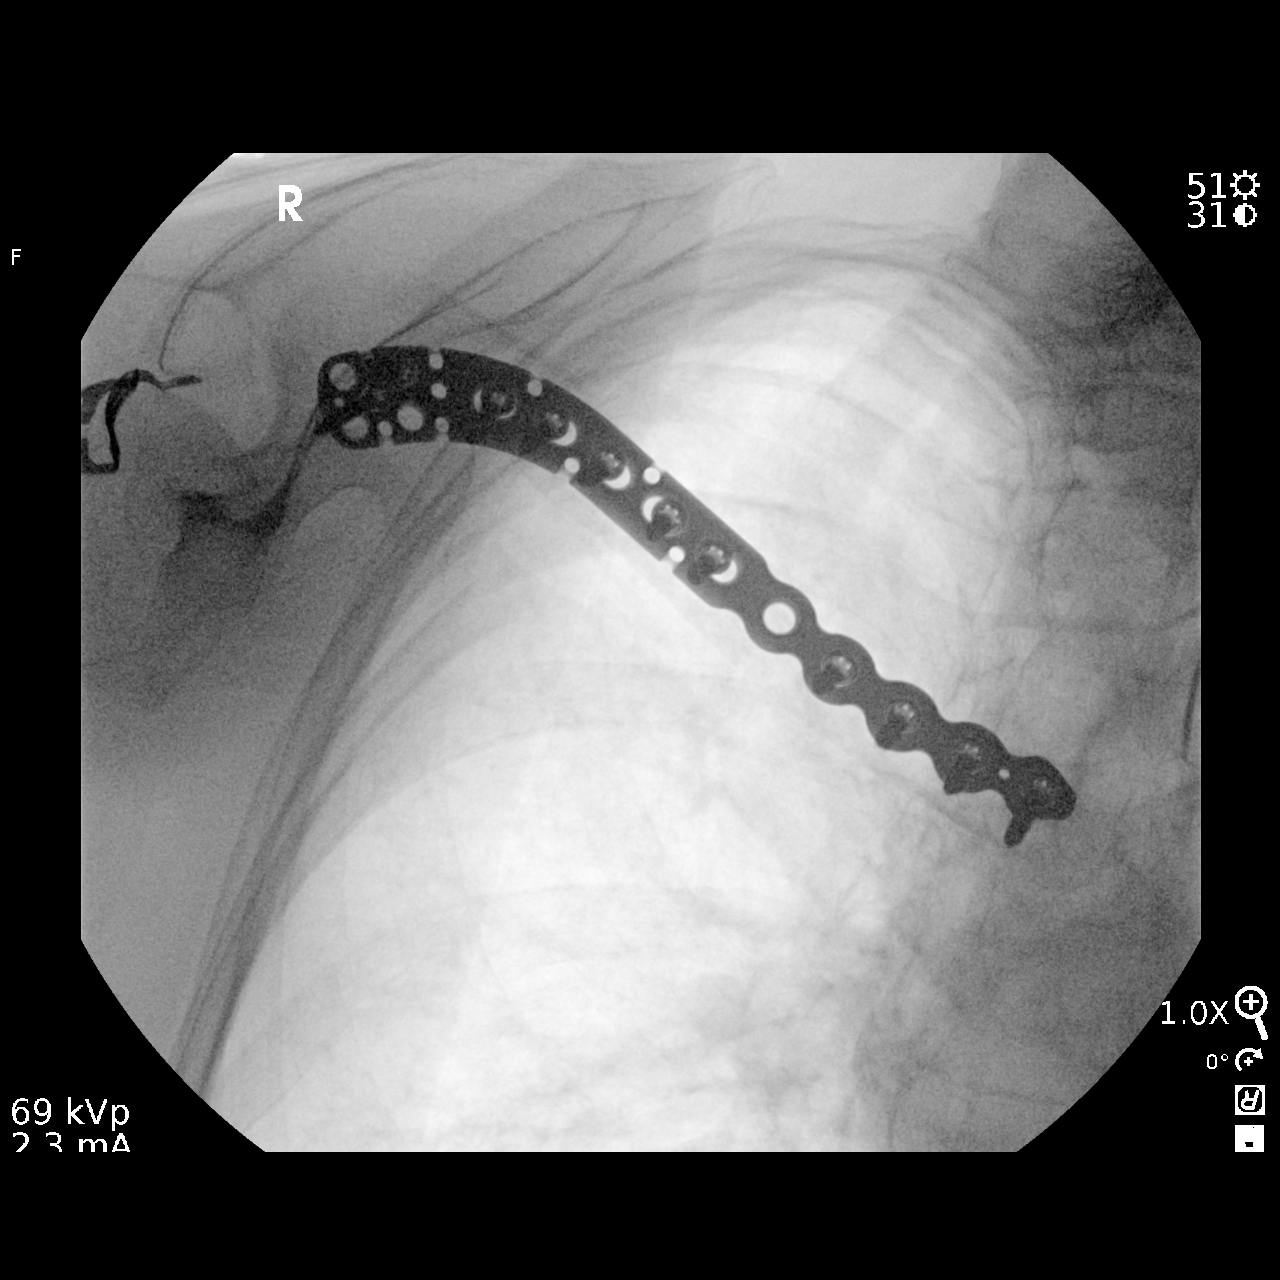

[2 of 2 positions shown; findings below may reference images not displayed]

FINDINGS: internal fixation of RIGHT clavicle fracture with dynamic plate and
multiple cortical screws. Normal alignment.
IMPRESSION: RIGHT clavicle internal fixation.  No complicating features

## 2021-04-14 ENCOUNTER — Other Ambulatory Visit (INDEPENDENT_AMBULATORY_CARE_PROVIDER_SITE_OTHER): Payer: Self-pay | Admitting: Ophthalmology

## 2021-04-17 ENCOUNTER — Other Ambulatory Visit: Payer: Self-pay

## 2021-04-17 ENCOUNTER — Ambulatory Visit (INDEPENDENT_AMBULATORY_CARE_PROVIDER_SITE_OTHER): Payer: Medicare Other | Admitting: Ophthalmology

## 2021-04-17 ENCOUNTER — Encounter (INDEPENDENT_AMBULATORY_CARE_PROVIDER_SITE_OTHER): Payer: Self-pay | Admitting: Ophthalmology

## 2021-04-17 DIAGNOSIS — H35352 Cystoid macular degeneration, left eye: Secondary | ICD-10-CM

## 2021-04-17 DIAGNOSIS — D869 Sarcoidosis, unspecified: Secondary | ICD-10-CM

## 2021-04-17 DIAGNOSIS — E119 Type 2 diabetes mellitus without complications: Secondary | ICD-10-CM

## 2021-04-17 DIAGNOSIS — H401111 Primary open-angle glaucoma, right eye, mild stage: Secondary | ICD-10-CM | POA: Diagnosis not present

## 2021-04-17 DIAGNOSIS — H401123 Primary open-angle glaucoma, left eye, severe stage: Secondary | ICD-10-CM

## 2021-04-17 DIAGNOSIS — D8683 Sarcoid iridocyclitis: Secondary | ICD-10-CM

## 2021-04-17 DIAGNOSIS — H2012 Chronic iridocyclitis, left eye: Secondary | ICD-10-CM

## 2021-04-17 NOTE — Progress Notes (Signed)
04/17/2021     CHIEF COMPLAINT Patient presents for  Chief Complaint  Patient presents with   Retina Follow Up      HISTORY OF PRESENT ILLNESS: Sabrina Eaton is a 80 y.o. female who presents to the clinic today for:   HPI     Retina Follow Up           Diagnosis: Other   Laterality: both eyes   Onset: 6 months ago   Severity: mild   Duration: 6 months   Course: stable         Comments   6 month fu OU and OCT/FP- Cystoid Macular Edema OS.  Pt states VA OU stable since last visit. Pt denies FOL, floaters, or ocular pain OU.   Pt reports that she is using Pred Forte QOD OS, Brimonidine BID OS, Timolol BID OU and Dorzolamide BID OS         Last edited by Kendra Opitz, COA on 04/17/2021  8:07 AM.      Referring physician: Dian Queen, MD [address] [place],  [place] [address]  HISTORICAL INFORMATION:   Selected notes from the MEDICAL RECORD NUMBER       CURRENT MEDICATIONS: Current Outpatient Medications (Ophthalmic Drugs)  Medication Sig   brimonidine (ALPHAGAN) 0.2 % ophthalmic solution Place 1 drop into the left eye in the morning and at bedtime.   dorzolamide (TRUSOPT) 2 % ophthalmic solution Place 1 drop into the left eye 2 (two) times daily.   Polyethyl Glycol-Propyl Glycol (SYSTANE HYDRATION PF OP) Systane (PF)   prednisoLONE acetate (PRED FORTE) 1 % ophthalmic suspension INSTILL 1 DROP IN THE LEFT EYE EVERY OTHER DAY   timolol (BETIMOL) 0.5 % ophthalmic solution Place 1 drop into both eyes 2 (two) times daily.   No current facility-administered medications for this visit. (Ophthalmic Drugs)   Current Outpatient Medications (Other)  Medication Sig   acetaminophen (TYLENOL) 500 MG tablet Take 500 mg by mouth every 6 (six) hours as needed for mild pain.   citalopram (CELEXA) 20 MG tablet Take 20 mg by mouth daily.   doxylamine, Sleep, (UNISOM) 25 MG tablet Take 25 mg by mouth at bedtime.   ezetimibe-simvastatin (VYTORIN)  10-40 MG tablet Take 1 tablet by mouth daily.   HYDROcodone-acetaminophen (NORCO) 5-325 MG tablet Take 1-2 tablets by mouth every 6 (six) hours as needed.   metFORMIN (GLUCOPHAGE) 500 MG tablet Take 500 mg by mouth in the morning, at noon, and at bedtime.   metoprolol tartrate (LOPRESSOR) 50 MG tablet Take 50 mg by mouth daily.   Multiple Vitamins-Minerals (CENTRUM VITAMINTS PO) Take 1 tablet by mouth daily.   niacin (NIASPAN) 1000 MG CR tablet Take 1,000 mg by mouth daily.   Omega-3 Fatty Acids (FISH OIL) 1000 MG CAPS Take 1 capsule by mouth in the morning, at noon, and at bedtime.   ondansetron (ZOFRAN ODT) 4 MG disintegrating tablet Take 1 tablet (4 mg total) by mouth every 8 (eight) hours as needed.   No current facility-administered medications for this visit. (Other)      REVIEW OF SYSTEMS:    ALLERGIES No Known Allergies  PAST MEDICAL HISTORY Past Medical History:  Diagnosis Date   Arthritis    Asthma due to seasonal allergies    Cancer ([place])    skin cancer - face - basal   Diabetes mellitus without complication ([place])    Type II   Environmental allergies    History of  kidney stones    1 small in each kidney - has had for years   Hypertension    Right clavicle fracture    Sarcoidosis    effecting the eyes   Past Surgical History:  Procedure Laterality Date   BRONCHOSCOPY      x 2 to determine if she has scarcoidosis   COLONOSCOPY W/ POLYPECTOMY     ORIF CLAVICULAR FRACTURE Right 07/12/2019   Procedure: OPEN REDUCTION INTERNAL FIXATION (ORIF) CLAVICULAR FRACTURE;  Surgeon: Nicholes Stairs, MD;  Location: [facility];  Service: Orthopedics;  Laterality: Right;    FAMILY HISTORY History reviewed. No pertinent family history.  SOCIAL HISTORY Social History   Tobacco Use   Smoking status: Some Days    Packs/day: 1.00    Types: Cigarettes   Smokeless tobacco: Never  Vaping Use   Vaping Use: Never used  Substance Use Topics   Alcohol use: Not Currently    Drug use: Not Currently         OPHTHALMIC EXAM:  Base Eye Exam     Visual Acuity (ETDRS)       Right Left   Dist cc 20/20 -1 20/100   Dist ph cc  20/50 +1         Tonometry (Tonopen, 8:12 AM)       Right Left   Pressure 10 14         Pupils       Pupils Dark Light Shape React APD   Right PERRL 3 3 Round Minimal None   Left PERRL 3 3 Round Minimal None         Visual Fields (Counting fingers)       Left Right    Full Full         Extraocular Movement       Right Left    Full, Ortho Full, Ortho         Neuro/Psych     Oriented x3: Yes   Mood/Affect: Normal         Dilation     Both eyes: 1.0% Mydriacyl, 2.5% Phenylephrine @ 8:12 AM           Slit Lamp and Fundus Exam     External Exam       Right Left   External Normal Normal         Slit Lamp Exam       Right Left   Lids/Lashes Normal Normal   Conjunctiva/Sclera White and quiet White and quiet   Cornea Clear Clear, no kp   Anterior Chamber Deep and quiet Deep and quiet   Iris Round and reactive Round and reactive   Lens Posterior chamber intraocular lens Posterior chamber intraocular lens   Anterior Vitreous Normal Normal         Fundus Exam       Right Left   Posterior Vitreous Normal Normal   Disc Normal  notch inferior   C/D Ratio 0.6 0.85   Macula Normal Hard drusen   Vessels Normal,  , no DR Normal,  , no DR, peripheral white lines of retinal nonperfusion previous sarcoid vasculitis   Periphery Normal 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 Scan locations included subfoveal. Central Foveal Thickness: 229.  Progression has been stable. Findings include normal foveal contour.   Left Eye Quality was good. Scan locations included subfoveal. Central Foveal Thickness: 200. Progression has  been stable. Findings include abnormal foveal contour, central retinal atrophy, outer retinal atrophy, inner retinal atrophy.   Notes No active maculopathy in either eye  OS with diffuse macular atrophy noted  Stable over time     Color Fundus Photography Optos - OU - Both Eyes       Right Eye Progression has been stable. Disc findings include normal observations, increased cup to disc ratio. Macula : normal observations. Vessels : normal observations. Periphery : normal observations.   Left Eye Progression has been stable. Disc findings include increased cup to disc ratio, pallor.   Notes OD with mild open-angle glaucoma  OS with advanced secondary glaucoma from previous sarcoid uveitis, recurrences.  Peripheral punched-out chorioretinal scars approximately 450 to 35 m in size peripherally, evidence of retinal nonperfusion with white lines of nonperfusion present temporally but no active inflammation currently             ASSESSMENT/PLAN:  Sarcoidosis Not active  Cystoid macular edema of left eye Resolved not active currently  Chronic iridocyclitis of left eye No active disease OS  Diabetes mellitus without complication (HCC) No detectable diabetic retinopathy  Sarcoid uveitis of left eye No sign of KP today, stabilized over the years with 1 drop prednisone acetate every other day to left eye (every other day)     ICD-10-CM   1. Cystoid macular edema of left eye  H35.352 OCT, Retina - OU - Both Eyes    Color Fundus Photography Optos - OU - Both Eyes    2. Primary open angle glaucoma of right eye, mild stage  H40.1111     3. Primary open angle glaucoma of left eye, severe stage  H40.1123     4. Sarcoidosis  D86.9     5. Chronic iridocyclitis of left eye  H20.12     6. Diabetes mellitus without complication ([place])  O87.8     7. Sarcoid uveitis of left eye  D86.83       1.  No active anterior or posterior segment uveitis OS.  2.  No sign of KP of the  cornea as has been visible in the past when off of steroids topically.  3.  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 Dionne Ano to [place] Ordered this visit:  No orders of the defined types were placed in this encounter.      Return in about 6 months (around 10/16/2021) for DILATE OU, OCT, COLOR FP.  There are no Patient Instructions on file for this visit.   Explained the diagnoses, plan, and follow up with the patient and they expressed understanding.  Patient expressed understanding of the importance of proper follow up care.   Clent Demark Lillianna Sabel M.D. Diseases & Surgery of the Retina and Vitreous Retina & Diabetic [facility] 04/17/21     Abbreviations: M myopia (nearsighted); A astigmatism; H hyperopia (farsighted); P presbyopia; Mrx spectacle prescription;  CTL contact lenses; OD right eye; OS left eye; OU both eyes  XT exotropia; ET esotropia; PEK punctate epithelial keratitis; PEE punctate epithelial erosions; DES dry eye syndrome; MGD meibomian gland dysfunction; ATs artificial tears; PFAT's preservative free artificial tears; [place] nuclear sclerotic cataract; [place]  posterior subcapsular cataract; ERM epi-retinal membrane; PVD posterior vitreous detachment; RD retinal detachment; DM diabetes mellitus; DR diabetic retinopathy; NPDR non-proliferative diabetic retinopathy; PDR proliferative diabetic retinopathy; CSME clinically significant macular edema; DME diabetic macular edema; dbh dot blot hemorrhages; CWS cotton wool spot; POAG primary open angle glaucoma; C/D cup-to-disc ratio; HVF humphrey visual field; GVF goldmann visual field; OCT optical coherence tomography; IOP intraocular pressure; BRVO Branch retinal vein occlusion; CRVO central retinal vein occlusion; CRAO central retinal artery occlusion;  BRAO branch retinal artery occlusion; RT retinal tear; SB scleral buckle; PPV pars plana vitrectomy; VH Vitreous hemorrhage; PRP panretinal laser photocoagulation; IVK intravitreal kenalog; VMT vitreomacular traction; MH Macular hole;  NVD neovascularization of the disc; NVE neovascularization elsewhere; AREDS age related eye disease study; ARMD age related macular degeneration; POAG primary open angle glaucoma; EBMD epithelial/anterior basement membrane dystrophy; ACIOL anterior chamber intraocular lens; IOL intraocular lens; PCIOL posterior chamber intraocular lens; Phaco/IOL phacoemulsification with intraocular lens placement; [place] photorefractive keratectomy; LASIK laser assisted in situ keratomileusis; HTN hypertension; DM diabetes mellitus; COPD chronic obstructive pulmonary disease

## 2021-04-17 NOTE — Assessment & Plan Note (Signed)
No sign of KP today, stabilized over the years with 1 drop prednisone acetate every other day to left eye (every other day)

## 2021-04-17 NOTE — Assessment & Plan Note (Signed)
No active disease OS 

## 2021-04-17 NOTE — Assessment & Plan Note (Signed)
Resolved not active currently

## 2021-04-17 NOTE — Assessment & Plan Note (Signed)
No detectable diabetic retinopathy 

## 2021-04-17 NOTE — Assessment & Plan Note (Signed)
Not active 

## 2021-09-19 ENCOUNTER — Encounter (INDEPENDENT_AMBULATORY_CARE_PROVIDER_SITE_OTHER): Payer: Self-pay

## 2021-10-16 ENCOUNTER — Encounter (INDEPENDENT_AMBULATORY_CARE_PROVIDER_SITE_OTHER): Payer: Self-pay | Admitting: Ophthalmology

## 2021-10-16 ENCOUNTER — Encounter (INDEPENDENT_AMBULATORY_CARE_PROVIDER_SITE_OTHER): Payer: Medicare Other | Admitting: Ophthalmology

## 2021-10-16 ENCOUNTER — Ambulatory Visit (INDEPENDENT_AMBULATORY_CARE_PROVIDER_SITE_OTHER): Payer: Medicare Other | Admitting: Ophthalmology

## 2021-10-16 DIAGNOSIS — H35352 Cystoid macular degeneration, left eye: Secondary | ICD-10-CM | POA: Diagnosis not present

## 2021-10-16 DIAGNOSIS — H401123 Primary open-angle glaucoma, left eye, severe stage: Secondary | ICD-10-CM | POA: Diagnosis not present

## 2021-10-16 DIAGNOSIS — D8683 Sarcoid iridocyclitis: Secondary | ICD-10-CM

## 2021-10-16 DIAGNOSIS — H2012 Chronic iridocyclitis, left eye: Secondary | ICD-10-CM

## 2021-10-16 NOTE — Assessment & Plan Note (Signed)
History of, and now stabilized and no recurrence of but no recurrence of mutton fat keratic precipitates now on prednisolone acetate 1 drop q. OD.  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  Probably contributes to the appearance of glaucomatous atrophy OS

## 2021-10-16 NOTE — Assessment & Plan Note (Addendum)
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  Probably contributes to the appearance of glaucomatous atrophy OS  Under the care of Dr. Dionne Ano, [place], active management ongoing

## 2021-10-16 NOTE — Patient Instructions (Signed)
For control of iridocyclitis and intermediate uveitis, continue prednisolone acetate 1 drop left eye q. OD

## 2021-10-16 NOTE — Assessment & Plan Note (Signed)
No sign of recurrence today

## 2021-10-16 NOTE — Assessment & Plan Note (Signed)
No sign of recurrence OS today

## 2021-10-16 NOTE — Progress Notes (Signed)
10/16/2021     CHIEF COMPLAINT Patient presents for  Chief Complaint  Patient presents with   Cystoid Macular Edema   And history of sarcoid uveitis OS longstanding, now controlled most recently   HISTORY OF PRESENT ILLNESS: Sabrina Eaton is a 81 y.o. female who presents to the clinic today for:   HPI   6 MOS for DILATE OU, OCT, COLOR FP. "I had a really bad sinus infection since December I'm wondering if that affected my vision. I finally got medication. I noticed a difference in my left eye right after December and I'm wondering if that has any correlation. My right eye is good but my left eye is blurry."  Pt denies floaters and FOL.   Last edited by Silvestre Moment on 10/16/2021  8:20 AM.      Referring physician: Dian Queen, MD [address],  [place] [address]  HISTORICAL INFORMATION:   Selected notes from the MEDICAL RECORD NUMBER       CURRENT MEDICATIONS: Current Outpatient Medications (Ophthalmic Drugs)  Medication Sig   brimonidine (ALPHAGAN) 0.2 % ophthalmic solution Place 1 drop into the left eye in the morning and at bedtime.   dorzolamide (TRUSOPT) 2 % ophthalmic solution Place 1 drop into the left eye 2 (two) times daily.   Polyethyl Glycol-Propyl Glycol (SYSTANE HYDRATION PF OP) Systane (PF)   prednisoLONE acetate (PRED FORTE) 1 % ophthalmic suspension INSTILL 1 DROP IN THE LEFT EYE EVERY OTHER DAY   timolol (BETIMOL) 0.5 % ophthalmic solution Place 1 drop into both eyes 2 (two) times daily.   No current facility-administered medications for this visit. (Ophthalmic Drugs)   Current Outpatient Medications (Other)  Medication Sig   acetaminophen (TYLENOL) 500 MG tablet Take 500 mg by mouth every 6 (six) hours as needed for mild pain.   citalopram (CELEXA) 20 MG tablet Take 20 mg by mouth daily.   doxylamine, Sleep, (UNISOM) 25 MG tablet Take 25 mg by mouth at bedtime.   ezetimibe-simvastatin (VYTORIN) 10-40 MG tablet Take 1 tablet  by mouth daily.   HYDROcodone-acetaminophen (NORCO) 5-325 MG tablet Take 1-2 tablets by mouth every 6 (six) hours as needed.   metFORMIN (GLUCOPHAGE) 500 MG tablet Take 500 mg by mouth in the morning, at noon, and at bedtime.   metoprolol tartrate (LOPRESSOR) 50 MG tablet Take 50 mg by mouth daily.   Multiple Vitamins-Minerals (CENTRUM VITAMINTS PO) Take 1 tablet by mouth daily.   niacin (NIASPAN) 1000 MG CR tablet Take 1,000 mg by mouth daily.   Omega-3 Fatty Acids (FISH OIL) 1000 MG CAPS Take 1 capsule by mouth in the morning, at noon, and at bedtime.   ondansetron (ZOFRAN ODT) 4 MG disintegrating tablet Take 1 tablet (4 mg total) by mouth every 8 (eight) hours as needed.   No current facility-administered medications for this visit. (Other)      REVIEW OF SYSTEMS: ROS   Negative for: Constitutional, Gastrointestinal, Neurological, Skin, Genitourinary, Musculoskeletal, HENT, Endocrine, Cardiovascular, Eyes, Respiratory, Psychiatric, Allergic/Imm, Heme/Lymph Last edited by Silvestre Moment on 10/16/2021  8:20 AM.       ALLERGIES No Known Allergies  PAST MEDICAL HISTORY Past Medical History:  Diagnosis Date   Arthritis    Asthma due to seasonal allergies    Cancer ([place])    skin cancer - face - basal   Diabetes mellitus without complication ([place])    Type II   Environmental allergies    History of kidney stones  1 small in each kidney - has had for years   Hypertension    Right clavicle fracture    Sarcoidosis    effecting the eyes   Past Surgical History:  Procedure Laterality Date   BRONCHOSCOPY      x 2 to determine if she has scarcoidosis   COLONOSCOPY W/ POLYPECTOMY     ORIF CLAVICULAR FRACTURE Right 07/12/2019   Procedure: OPEN REDUCTION INTERNAL FIXATION (ORIF) CLAVICULAR FRACTURE;  Surgeon: Nicholes Stairs, MD;  Location: [facility];  Service: Orthopedics;  Laterality: Right;    FAMILY HISTORY History reviewed. No pertinent family history.  SOCIAL HISTORY Social  History   Tobacco Use   Smoking status: Some Days    Packs/day: 1.00    Types: Cigarettes   Smokeless tobacco: Never  Vaping Use   Vaping Use: Never used  Substance Use Topics   Alcohol use: Not Currently   Drug use: Not Currently         OPHTHALMIC EXAM:  Base Eye Exam     Visual Acuity (ETDRS)       Right Left   Dist cc 20/20 20/70    Correction: Glasses         Tonometry (Tonopen, 8:28 AM)       Right Left   Pressure 11 10         Pupils       Pupils APD   Right PERRL None   Left PERRL None         Visual Fields       Left Right    Full Full         Extraocular Movement       Right Left    Full Full         Neuro/Psych     Oriented x3: Yes   Mood/Affect: Normal         Dilation     Both eyes: 1.0% Mydriacyl, 2.5% Phenylephrine @ 8:28 AM           Slit Lamp and Fundus Exam     External Exam       Right Left   External Normal Normal         Slit Lamp Exam       Right Left   Lids/Lashes Normal Normal   Conjunctiva/Sclera White and quiet White and quiet   Cornea Clear Clear, no kp   Anterior Chamber Deep and quiet Deep and quiet   Iris Round and reactive Round and reactive   Lens Posterior chamber intraocular lens Posterior chamber intraocular lens   Anterior Vitreous Normal Normal         Fundus Exam       Right Left   Posterior Vitreous Normal Normal   Disc Normal notched, 2+ Optic disc atrophy, 2+ Pallor   C/D Ratio 0.6 0.9   Macula Normal Hard drusen   Vessels Normal,  , no DR Normal,  , no DR, peripheral white lines of retinal nonperfusion previous sarcoid vasculitis   Periphery Normal Old peripheral Punched-out chorioretinal scars, no active inflammation            IMAGING AND PROCEDURES  Imaging and Procedures for 10/16/21  OCT, Retina - OU - Both Eyes       Right Eye Quality was good. Scan locations included subfoveal. Central Foveal Thickness: 230. Progression has been stable.  Findings include normal foveal contour.   Left Eye Quality was good. Scan locations included subfoveal.  Central Foveal Thickness: 191. Progression has been stable. Findings include abnormal foveal contour, central retinal atrophy, inner retinal atrophy, outer retinal atrophy.   Notes No active maculopathy in either eye  OS with diffuse macular atrophy noted  Stable over time, peripheral white lines of retinal nonperfusion OS     Color Fundus Photography Optos - OU - Both Eyes       Right Eye Progression has been stable. Disc findings include normal observations, increased cup to disc ratio. Macula : normal observations. Vessels : normal observations. Periphery : normal observations.   Left Eye Progression has been stable. Disc findings include increased cup to disc ratio, pallor.   Notes OD with mild open-angle glaucoma  OS with advanced secondary glaucoma from previous sarcoid uveitis, recurrences.  Peripheral punched-out chorioretinal scars approximately 450 to 900 m in size peripherally, evidence of retinal nonperfusion with white lines of nonperfusion present temporally but no active inflammation currently             ASSESSMENT/PLAN:  Sarcoid uveitis of left eye History of, and now stabilized and no recurrence of but no recurrence of mutton fat keratic precipitates now on prednisolone acetate 1 drop q. OD.  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  Probably contributes to the appearance of glaucomatous atrophy OS  Under the care of Dr. Dionne Ano, [place], active management ongoing  Chronic iridocyclitis of left eye No sign of recurrence OS today  Cystoid  macular edema of left eye No sign of recurrence today     ICD-10-CM   1. Cystoid macular edema of left eye  H35.352 OCT, Retina - OU - Both Eyes    Color Fundus Photography Optos - OU - Both Eyes    2. Sarcoid uveitis of left eye  D86.83     3. Primary open angle glaucoma of left eye, severe stage  H40.1123     4. Chronic iridocyclitis of left eye  H20.12       1.  OS, no signs of active anterior segment posterior segment inflammation today.  2.  History of sarcoid vasculitic disease, with peripheral retinal nonperfusion likely contributory to the appearance of optic atrophy and optic nerve compromise  OS.  3.  OD remained stable.  Ophthalmic Meds Ordered this visit:  No orders of the defined types were placed in this encounter.      Return in about 6 months (around 04/17/2022) for DILATE OU, COLOR FP, OCT.  Patient Instructions  For control of iridocyclitis and intermediate uveitis, continue prednisolone acetate 1 drop left eye q. OD   Explained the diagnoses, plan, and follow up with the patient and they expressed understanding.  Patient expressed understanding of the importance of proper follow up care.   Clent Demark Talma Aguillard M.D. Diseases & Surgery of the Retina and Vitreous Retina & Diabetic [place] 10/16/21     Abbreviations: M myopia (nearsighted); A astigmatism; H hyperopia (farsighted); P presbyopia; Mrx spectacle prescription;  CTL contact lenses; OD right eye; OS left eye; OU both eyes  XT exotropia; ET esotropia; PEK punctate epithelial keratitis; PEE punctate epithelial erosions; DES dry eye syndrome; MGD meibomian gland dysfunction; ATs artificial tears; PFAT's preservative free artificial tears; [place] nuclear sclerotic cataract; [place]  posterior subcapsular cataract; ERM epi-retinal membrane; PVD posterior vitreous detachment; RD retinal detachment; DM diabetes mellitus; DR diabetic retinopathy; NPDR non-proliferative diabetic retinopathy; PDR proliferative diabetic  retinopathy; CSME clinically significant macular edema; DME diabetic macular edema; dbh dot blot hemorrhages; CWS cotton wool spot; POAG primary open angle glaucoma; C/D cup-to-disc ratio; HVF humphrey visual field; GVF goldmann visual field; OCT optical coherence tomography; IOP intraocular pressure; BRVO Branch retinal vein occlusion; CRVO central retinal vein occlusion; CRAO central retinal artery occlusion; BRAO branch retinal artery occlusion; RT retinal tear; SB scleral buckle; PPV pars plana vitrectomy; VH Vitreous hemorrhage; PRP panretinal laser photocoagulation; IVK intravitreal kenalog; VMT vitreomacular traction; MH Macular hole;  NVD neovascularization of the disc; NVE neovascularization elsewhere; AREDS age related eye disease study; ARMD age related macular degeneration; POAG primary open angle glaucoma; EBMD epithelial/anterior basement membrane dystrophy; ACIOL anterior chamber intraocular lens; IOL intraocular lens; PCIOL posterior chamber intraocular lens; Phaco/IOL phacoemulsification with intraocular lens placement; [place] photorefractive keratectomy; LASIK laser assisted in situ keratomileusis; HTN hypertension; DM diabetes mellitus; COPD chronic obstructive pulmonary disease

## 2022-01-10 DIAGNOSIS — J329 Chronic sinusitis, unspecified: Secondary | ICD-10-CM | POA: Diagnosis not present

## 2022-01-10 DIAGNOSIS — Z01419 Encounter for gynecological examination (general) (routine) without abnormal findings: Secondary | ICD-10-CM | POA: Diagnosis not present

## 2022-01-10 DIAGNOSIS — Z124 Encounter for screening for malignant neoplasm of cervix: Secondary | ICD-10-CM | POA: Diagnosis not present

## 2022-01-10 DIAGNOSIS — K529 Noninfective gastroenteritis and colitis, unspecified: Secondary | ICD-10-CM | POA: Diagnosis not present

## 2022-01-10 DIAGNOSIS — Z681 Body mass index (BMI) 19 or less, adult: Secondary | ICD-10-CM | POA: Diagnosis not present

## 2022-02-12 DIAGNOSIS — H6121 Impacted cerumen, right ear: Secondary | ICD-10-CM | POA: Insufficient documentation

## 2022-02-12 DIAGNOSIS — D869 Sarcoidosis, unspecified: Secondary | ICD-10-CM | POA: Diagnosis not present

## 2022-02-12 DIAGNOSIS — J329 Chronic sinusitis, unspecified: Secondary | ICD-10-CM | POA: Diagnosis not present

## 2022-02-12 DIAGNOSIS — R519 Headache, unspecified: Secondary | ICD-10-CM | POA: Insufficient documentation

## 2022-02-20 DIAGNOSIS — H4052X3 Glaucoma secondary to other eye disorders, left eye, severe stage: Secondary | ICD-10-CM | POA: Diagnosis not present

## 2022-02-20 LAB — OPHTHALMOLOGY REPORT-SCANNED

## 2022-02-24 DIAGNOSIS — J3489 Other specified disorders of nose and nasal sinuses: Secondary | ICD-10-CM | POA: Diagnosis not present

## 2022-02-24 DIAGNOSIS — J320 Chronic maxillary sinusitis: Secondary | ICD-10-CM | POA: Diagnosis not present

## 2022-02-24 DIAGNOSIS — R519 Headache, unspecified: Secondary | ICD-10-CM | POA: Diagnosis not present

## 2022-02-24 DIAGNOSIS — D869 Sarcoidosis, unspecified: Secondary | ICD-10-CM | POA: Diagnosis not present

## 2022-03-10 DIAGNOSIS — D869 Sarcoidosis, unspecified: Secondary | ICD-10-CM | POA: Diagnosis not present

## 2022-03-10 DIAGNOSIS — J320 Chronic maxillary sinusitis: Secondary | ICD-10-CM | POA: Diagnosis not present

## 2022-03-25 NOTE — Progress Notes (Signed)
[facility] Note  04/08/2022     CHIEF COMPLAINT Patient presents for Retina Evaluation   HISTORY OF PRESENT ILLNESS: Sabrina Eaton is a 81 y.o. female who presents to the clinic today for:   HPI     Retina Evaluation   In left eye.  This started 20 years ago.  Associated Symptoms Floaters.  Negative for Flashes, Distortion, Blind Spot and Pain.  I, the attending physician,  performed the HPI with the patient and updated documentation appropriately.        Comments   Patient feels that the vision is the same as it always been. 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 She is a former patient of Dr. Zadie Rhine. She is using Brimonidine OS BID, Pred IS every other day, Dorzolamide OS BID, Timolol  OU BID, and AT's OU PRN. Her blood sugar was 116 and her A1c is 5.7.      Last edited by Bernarda Caffey, MD on 04/11/2022  4:28 PM.    Pt is a previous pt of Dr. 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 Zadie Rhine put her on PF and she used them for "several years" and the sarcoid went into remission, as soon as she came off the drop, it came back, Dr. Zadie Rhine told her, he doesn't want to take her back off of it, so now she takes it every other day, pt also sees Dr. Edilia Bo for glaucoma, pt is not on any systemic medications for sarcoid, pt also has diabetes  Referring physician: Dian Queen, MD [address],  [place] [address]  HISTORICAL INFORMATION:   Selected notes from the MEDICAL RECORD NUMBER Self referral -- transferring care from Dr. Zadie Rhine LEE:  Ocular Hx- PMH-    CURRENT MEDICATIONS: Current Outpatient Medications (Ophthalmic Drugs)  Medication Sig   brimonidine (ALPHAGAN) 0.2 % ophthalmic solution Place 1 drop into the left eye in the morning and at bedtime.   dorzolamide (TRUSOPT) 2 % ophthalmic solution Place 1 drop into the left eye 2 (two)  times daily.   Polyethyl Glycol-Propyl Glycol (SYSTANE HYDRATION PF OP) Systane (PF)   prednisoLONE acetate (PRED FORTE) 1 % ophthalmic suspension INSTILL 1 DROP IN THE LEFT EYE EVERY OTHER DAY   timolol (BETIMOL) 0.5 % ophthalmic solution Place 1 drop into both eyes 2 (two) times daily.   No current facility-administered medications for this visit. (Ophthalmic Drugs)   Current Outpatient Medications (Other)  Medication Sig   acetaminophen (TYLENOL) 500 MG tablet Take 500 mg by mouth every 6 (six) hours as needed for mild pain.   citalopram (CELEXA) 20 MG tablet Take 20 mg by mouth daily.   doxylamine, Sleep, (UNISOM) 25 MG tablet Take 25 mg by mouth at bedtime.   ezetimibe-simvastatin (VYTORIN) 10-40 MG tablet Take 1 tablet by mouth daily.   HYDROcodone-acetaminophen (NORCO) 5-325 MG tablet Take 1-2 tablets by mouth every 6 (six) hours as needed.   metFORMIN (GLUCOPHAGE) 500 MG tablet Take 500 mg by mouth in the morning, at noon, and at bedtime.   metoprolol tartrate (LOPRESSOR) 50 MG tablet Take 50 mg by mouth daily.   Multiple Vitamins-Minerals (CENTRUM VITAMINTS PO) Take 1 tablet by mouth daily.   niacin (NIASPAN) 1000 MG CR tablet Take 1,000 mg by mouth daily.   Omega-3 Fatty Acids (FISH OIL) 1000 MG CAPS Take 1  capsule by mouth in the morning, at noon, and at bedtime.   ondansetron (ZOFRAN ODT) 4 MG disintegrating tablet Take 1 tablet (4 mg total) by mouth every 8 (eight) hours as needed.   No current facility-administered medications for this visit. (Other)   REVIEW OF SYSTEMS: ROS   Positive for: Endocrine, Eyes Last edited by Annie Paras, COT on 04/08/2022  8:24 AM.     ALLERGIES No Known Allergies  PAST MEDICAL HISTORY Past Medical History:  Diagnosis Date   Arthritis    Asthma due to seasonal allergies    Cancer ([place])    skin cancer - face - basal   Diabetes mellitus without complication ([place])    Type II   Environmental allergies    History of kidney  stones    1 small in each kidney - has had for years   Hypertension    Right clavicle fracture    Sarcoidosis    effecting the eyes   Past Surgical History:  Procedure Laterality Date   BRONCHOSCOPY      x 2 to determine if she has scarcoidosis   COLONOSCOPY W/ POLYPECTOMY     ORIF CLAVICULAR FRACTURE Right 07/12/2019   Procedure: OPEN REDUCTION INTERNAL FIXATION (ORIF) CLAVICULAR FRACTURE;  Surgeon: Nicholes Stairs, MD;  Location: [place] [facility];  Service: Orthopedics;  Laterality: Right;   FAMILY HISTORY History reviewed. No pertinent family history.  SOCIAL HISTORY Social History   Tobacco Use   Smoking status: Some Days    Packs/day: 1.00    Types: Cigarettes   Smokeless tobacco: Never  Vaping Use   Vaping Use: Never used  Substance Use Topics   Alcohol use: Not Currently   Drug use: Not Currently       OPHTHALMIC EXAM:  Base Eye Exam     Visual Acuity (Snellen - Linear)       Right Left   Dist cc 20/20 -1 20/70 +2   Dist ph cc  20/50    Correction: Glasses         Tonometry (Tonopen, 8:38 AM)       Right Left   Pressure 9 12         Pupils       Dark Light Shape React APD   Right 3 2 Round Slow None   Left 3 2 Round Slow None         Visual Fields       Left Right    Full Full         Extraocular Movement       Right Left    Full, Ortho Full, Ortho         Neuro/Psych     Oriented x3: Yes   Mood/Affect: Normal         Dilation     Both eyes: 1.0% Mydriacyl, 2.5% Phenylephrine @ 8:37 AM           Slit Lamp and Fundus Exam     External Exam       Right Left   External Normal Normal         Slit Lamp Exam       Right Left   Lids/Lashes Dermatochalasis - upper lid, mild MGD Dermatochalasis - upper lid, mild MGD   Conjunctiva/Sclera White and quiet White and quiet   Cornea arcus, well healed cataract wound, 1+ Punctate epithelial erosions, tear film debris arcus, early band K, 1+ Punctate epithelial erosions  Anterior Chamber deep, clear, narrow temporal angle deep and clear, no cell or flare   Iris Round and dilated Round and dilated   Lens Posterior chamber intraocular lens, 1-2+ Posterior capsular opacification non-central Posterior chamber intraocular lens   Anterior Vitreous Vitreous syneresis Vitreous syneresis, no cell         Fundus Exam       Right Left   Posterior Vitreous  Posterior vitreous detachment   Disc mild Pallor, Sharp rim, mild PPA 3+pallor, +cupping, very thin inferior rim, +PPA   C/D Ratio 0.6 0.9   Macula Flat, Blunted foveal reflex, Drusen, RPE mottling, rare MA, no edema Flat, Blunted foveal reflex, mild RPE mottling, No heme or edema   Vessels attenuated, mild tortuosity Severe attenuation, peripheral sclerosis   Periphery Attached, rare MA Attached, scattered, punched out atrophic lesions           Refraction     Wearing Rx       Sphere Cylinder Axis Add   Right +0.50 Sphere  +2.50   Left +1.00 +0.50 001 +2.50         Manifest Refraction       Sphere Cylinder Axis Dist VA Add   Right +0.50 Sphere  20/20 +2.50   Left +1.25 +0.75 001 20/50 +2.50           IMAGING AND PROCEDURES  Imaging and Procedures for 04/08/2022  OCT, Retina - OU - Both Eyes       Right Eye Quality was good. Scan locations included subfoveal. Central Foveal Thickness: 231. Progression has no prior data. Findings include normal foveal contour, no IRF, no SRF, retinal drusen (Rare drusen).   Left Eye Quality was good. Scan locations included subfoveal. Central Foveal Thickness: 179. Progression has no prior data. Findings include normal foveal contour, no IRF, no SRF, inner retinal atrophy, outer retinal atrophy (Diffuse atrophy greatest non-centrally).   Notes *Images captured and stored on drive  Diagnosis / Impression:  NFP, no IRF/SRF OU OD: rare drusen OS: Diffuse atrophy greatest non-centrally  Clinical management:  See below  Abbreviations: NFP -  Normal foveal profile. CME - cystoid macular edema. PED - pigment epithelial detachment. IRF - intraretinal fluid. SRF - subretinal fluid. EZ - ellipsoid zone. ERM - epiretinal membrane. ORA - outer retinal atrophy. ORT - outer retinal tubulation. SRHM - subretinal hyper-reflective material. IRHM - intraretinal hyper-reflective material       Fluorescein Angiography Optos (Transit OS)       Right Eye Progression has no prior data. Early phase findings include microaneurysm. Mid/Late phase findings include microaneurysm (Scattered peripheral MA -- mild, No leakage, no NV).   Left Eye Progression has no prior data. Early phase findings include staining, window defect, vascular perfusion defect. Mid/Late phase findings include staining, window defect, vascular perfusion defect (No leakage -- no active inflammation or vasculitis).   Notes **Images stored on drive**  Impression: OD: Scattered peripheral MA -- mild, No leakage, no NV OS: No leakage -- no active inflammation or vasculitis           ASSESSMENT/PLAN:    ICD-10-CM   1. Sarcoid uveitis of left eye  D86.83 Fluorescein Angiography Optos (Transit OS)    2. Essential hypertension  I10     3. Hypertensive retinopathy of both eyes  H35.033 Fluorescein Angiography Optos (Transit OS)    4. Moderate nonproliferative diabetic retinopathy of both eyes without macular edema associated with type 2 diabetes mellitus ([place])  J94.1740 OCT, Retina -  OU - Both Eyes    Fluorescein Angiography Optos (Transit OS)    5. Secondary open-angle glaucoma  H40.50X0     6. Primary open angle glaucoma (POAG) of right eye, mild stage  H40.1111     7. Pseudophakia of both eyes  Z96.1      1. History of sarcoid uveitis, left eye  - per pt report, onset ~late 81s  - diagnosed and managed by Dr. Zadie Rhine  - has been inactive for quite some time  - currently on predForte 1 drop, every other day, for maintenance  - pt reports recurrence of  inflammation when she last tried to stop PredForte  - no active inflammation on exam, OCT or FA noted today  - continue PredForte 1 drop every other day OS for maintenance  - f/u 6 mos, sooner prn - DFE/OCT  2,3. Hypertensive retinopathy OU - discussed importance of tight BP control - monitor  4. Mild nonproliferative diabetic retinopathy w/o DME, both eyes - The incidence, risk factors for progression, natural history and treatment options for diabetic retinopathy were discussed with patient.   - The need for close monitoring of blood glucose, blood pressure, and serum lipids, avoiding cigarette or any type of tobacco, and the need for long term follow up was also discussed with patient. - exam with rare MA OU  - FA (12.12.23) shows OD: Scattered peripheral MA -- mild, No leakage, no NV; OS: No leakage -- no active inflammation or vasculitis - OCT without diabetic macular edema, both eyes  - f/u in 6 mos -- DFE/OCT  5,6. Secondary open angle glaucoma OS, POAG OD  - under the expert care of Dr. Edilia Bo  - IOP 9,12 - Timolol BID OU - Dorzolamide BID OS  - Brimonidine BID OS   7. Pseudophakia OU  - s/p CE/IOL OU  - IOL in good position, doing well  - monitor   Ophthalmic Meds Ordered this visit:  No orders of the defined types were placed in this encounter.    Return in about 6 months (around 10/08/2022) for f/u sarcoid OS, DFE, OCT.  There are no Patient Instructions on file for this visit.   Explained the diagnoses, plan, and follow up with the patient and they expressed understanding.  Patient expressed understanding of the importance of proper follow up care.   This document serves as a record of services personally performed by Gardiner Sleeper, MD, PhD. It was created on their behalf by Renaldo Reel, [place] an ophthalmic technician. The creation of this record is the provider's dictation and/or activities during the visit.    Electronically signed by:  Renaldo Reel,  COT  11.28.23 4:46 PM  This document serves as a record of services personally performed by Gardiner Sleeper, MD, PhD. It was created on their behalf by San Jetty. Owens Shark, OA an ophthalmic technician. The creation of this record is the provider's dictation and/or activities during the visit.    Electronically signed by: San Jetty. Owens Shark, [place] 12.12.2023 4:46 PM   Gardiner Sleeper, M.D., Ph.D. Diseases & Surgery of the Retina and Vitreous [facility]  I have reviewed the above documentation for accuracy and completeness, and I agree with the above. Gardiner Sleeper, M.D., Ph.D. 04/11/22 4:46 PM  Abbreviations: M myopia (nearsighted); A astigmatism; H hyperopia (farsighted); P presbyopia; Mrx spectacle prescription;  CTL contact lenses; OD right eye; OS left eye; OU both eyes  XT exotropia; ET esotropia; PEK punctate  epithelial keratitis; PEE punctate epithelial erosions; DES dry eye syndrome; MGD meibomian gland dysfunction; ATs artificial tears; PFAT's preservative free artificial tears; [place] nuclear sclerotic cataract; PSC posterior subcapsular cataract; ERM epi-retinal membrane; PVD posterior vitreous detachment; RD retinal detachment; DM diabetes mellitus; DR diabetic retinopathy; NPDR non-proliferative diabetic retinopathy; PDR proliferative diabetic retinopathy; CSME clinically significant macular edema; DME diabetic macular edema; dbh dot blot hemorrhages; CWS cotton wool spot; POAG primary open angle glaucoma; C/D cup-to-disc ratio; HVF humphrey visual field; GVF goldmann visual field; OCT optical coherence tomography; IOP intraocular pressure; BRVO Branch retinal vein occlusion; CRVO central retinal vein occlusion; CRAO central retinal artery occlusion; BRAO branch retinal artery occlusion; RT retinal tear; SB scleral buckle; PPV pars plana vitrectomy; VH Vitreous hemorrhage; PRP panretinal laser photocoagulation; IVK intravitreal kenalog; VMT vitreomacular traction; MH Macular  hole;  NVD neovascularization of the disc; NVE neovascularization elsewhere; AREDS age related eye disease study; ARMD age related macular degeneration; POAG primary open angle glaucoma; EBMD epithelial/anterior basement membrane dystrophy; ACIOL anterior chamber intraocular lens; IOL intraocular lens; PCIOL posterior chamber intraocular lens; Phaco/IOL phacoemulsification with intraocular lens placement; [place] photorefractive keratectomy; LASIK laser assisted in situ keratomileusis; HTN hypertension; DM diabetes mellitus; COPD chronic obstructive pulmonary disease

## 2022-04-04 DIAGNOSIS — Z1231 Encounter for screening mammogram for malignant neoplasm of breast: Secondary | ICD-10-CM | POA: Diagnosis not present

## 2022-04-08 ENCOUNTER — Ambulatory Visit (INDEPENDENT_AMBULATORY_CARE_PROVIDER_SITE_OTHER): Payer: Medicare Other | Admitting: Ophthalmology

## 2022-04-08 DIAGNOSIS — H401133 Primary open-angle glaucoma, bilateral, severe stage: Secondary | ICD-10-CM

## 2022-04-08 DIAGNOSIS — H401111 Primary open-angle glaucoma, right eye, mild stage: Secondary | ICD-10-CM

## 2022-04-08 DIAGNOSIS — D8683 Sarcoid iridocyclitis: Secondary | ICD-10-CM

## 2022-04-08 DIAGNOSIS — E113393 Type 2 diabetes mellitus with moderate nonproliferative diabetic retinopathy without macular edema, bilateral: Secondary | ICD-10-CM | POA: Diagnosis not present

## 2022-04-08 DIAGNOSIS — Z961 Presence of intraocular lens: Secondary | ICD-10-CM

## 2022-04-08 DIAGNOSIS — I10 Essential (primary) hypertension: Secondary | ICD-10-CM | POA: Diagnosis not present

## 2022-04-08 DIAGNOSIS — H35352 Cystoid macular degeneration, left eye: Secondary | ICD-10-CM

## 2022-04-08 DIAGNOSIS — H35033 Hypertensive retinopathy, bilateral: Secondary | ICD-10-CM

## 2022-04-08 DIAGNOSIS — D869 Sarcoidosis, unspecified: Secondary | ICD-10-CM

## 2022-04-08 DIAGNOSIS — H4050X0 Glaucoma secondary to other eye disorders, unspecified eye, stage unspecified: Secondary | ICD-10-CM

## 2022-04-11 ENCOUNTER — Encounter (INDEPENDENT_AMBULATORY_CARE_PROVIDER_SITE_OTHER): Payer: Self-pay | Admitting: Ophthalmology

## 2022-04-17 ENCOUNTER — Encounter (INDEPENDENT_AMBULATORY_CARE_PROVIDER_SITE_OTHER): Payer: Medicare Other | Admitting: Ophthalmology

## 2022-07-01 DIAGNOSIS — D869 Sarcoidosis, unspecified: Secondary | ICD-10-CM | POA: Diagnosis not present

## 2022-07-01 DIAGNOSIS — J3489 Other specified disorders of nose and nasal sinuses: Secondary | ICD-10-CM | POA: Diagnosis not present

## 2022-07-04 DIAGNOSIS — Z139 Encounter for screening, unspecified: Secondary | ICD-10-CM | POA: Diagnosis not present

## 2022-07-04 DIAGNOSIS — D869 Sarcoidosis, unspecified: Secondary | ICD-10-CM | POA: Diagnosis not present

## 2022-07-04 DIAGNOSIS — E7800 Pure hypercholesterolemia, unspecified: Secondary | ICD-10-CM | POA: Diagnosis not present

## 2022-07-04 DIAGNOSIS — E119 Type 2 diabetes mellitus without complications: Secondary | ICD-10-CM | POA: Diagnosis not present

## 2022-07-17 DIAGNOSIS — R921 Mammographic calcification found on diagnostic imaging of breast: Secondary | ICD-10-CM | POA: Diagnosis not present

## 2022-07-17 DIAGNOSIS — R590 Localized enlarged lymph nodes: Secondary | ICD-10-CM | POA: Diagnosis not present

## 2022-07-17 DIAGNOSIS — D869 Sarcoidosis, unspecified: Secondary | ICD-10-CM | POA: Diagnosis not present

## 2022-07-17 DIAGNOSIS — R911 Solitary pulmonary nodule: Secondary | ICD-10-CM | POA: Diagnosis not present

## 2022-07-18 DIAGNOSIS — E875 Hyperkalemia: Secondary | ICD-10-CM | POA: Diagnosis not present

## 2022-07-29 ENCOUNTER — Other Ambulatory Visit: Payer: Self-pay

## 2022-07-29 ENCOUNTER — Emergency Department (HOSPITAL_BASED_OUTPATIENT_CLINIC_OR_DEPARTMENT_OTHER)
Admission: EM | Admit: 2022-07-29 | Discharge: 2022-07-29 | Disposition: A | Discharge status: Home / Self Care | Payer: Medicare Other | Attending: Emergency Medicine | Admitting: Emergency Medicine

## 2022-07-29 ENCOUNTER — Encounter (HOSPITAL_BASED_OUTPATIENT_CLINIC_OR_DEPARTMENT_OTHER): Payer: Self-pay | Admitting: Emergency Medicine

## 2022-07-29 ENCOUNTER — Emergency Department (HOSPITAL_BASED_OUTPATIENT_CLINIC_OR_DEPARTMENT_OTHER): Payer: Medicare Other | Admitting: Radiology

## 2022-07-29 DIAGNOSIS — S2231XA Fracture of one rib, right side, initial encounter for closed fracture: Secondary | ICD-10-CM | POA: Diagnosis not present

## 2022-07-29 DIAGNOSIS — E119 Type 2 diabetes mellitus without complications: Secondary | ICD-10-CM | POA: Insufficient documentation

## 2022-07-29 DIAGNOSIS — J302 Other seasonal allergic rhinitis: Secondary | ICD-10-CM | POA: Diagnosis not present

## 2022-07-29 DIAGNOSIS — I10 Essential (primary) hypertension: Secondary | ICD-10-CM | POA: Insufficient documentation

## 2022-07-29 DIAGNOSIS — R0602 Shortness of breath: Secondary | ICD-10-CM | POA: Diagnosis not present

## 2022-07-29 DIAGNOSIS — Z79899 Other long term (current) drug therapy: Secondary | ICD-10-CM | POA: Insufficient documentation

## 2022-07-29 DIAGNOSIS — J9801 Acute bronchospasm: Secondary | ICD-10-CM | POA: Diagnosis not present

## 2022-07-29 DIAGNOSIS — Z87891 Personal history of nicotine dependence: Secondary | ICD-10-CM | POA: Diagnosis not present

## 2022-07-29 DIAGNOSIS — Z85828 Personal history of other malignant neoplasm of skin: Secondary | ICD-10-CM | POA: Diagnosis not present

## 2022-07-29 DIAGNOSIS — Z7984 Long term (current) use of oral hypoglycemic drugs: Secondary | ICD-10-CM | POA: Insufficient documentation

## 2022-07-29 HISTORY — DX: Type 2 diabetes mellitus without complications: E11.9

## 2022-07-29 MED ORDER — ALBUTEROL SULFATE HFA 108 (90 BASE) MCG/ACT IN AERS
2.0000 | INHALATION_SPRAY | RESPIRATORY_TRACT | Status: DC | PRN
  Administered 2022-07-29: 2 via RESPIRATORY_TRACT
  Filled 2022-07-29: qty 6.7

## 2022-07-29 MED ORDER — AEROCHAMBER PLUS FLO-VU LARGE MISC
Freq: Once | Status: DC
  Filled 2022-07-29: qty 1

## 2022-07-29 MED ORDER — IPRATROPIUM-ALBUTEROL 0.5-2.5 (3) MG/3ML IN SOLN
3.0000 mL | RESPIRATORY_TRACT | Status: DC
  Administered 2022-07-29: 3 mL via RESPIRATORY_TRACT
  Filled 2022-07-29: qty 3

## 2022-07-29 MED ORDER — FLUTICASONE PROPIONATE HFA 44 MCG/ACT IN AERO
2.0000 | INHALATION_SPRAY | Freq: Two times a day (BID) | RESPIRATORY_TRACT | Status: DC
  Administered 2022-07-29: 2 via RESPIRATORY_TRACT
  Filled 2022-07-29: qty 10.6

## 2022-07-29 NOTE — ED Provider Notes (Signed)
[facility] Provider Note: Georgena Spurling, MD, FACEP  CSN: [id number] MRN: [id number] ARRIVAL: 07/29/22 at [place]: [id number]   CHIEF COMPLAINT  Shortness of Breath   HISTORY OF PRESENT ILLNESS  07/29/22 5:17 AM Sabrina Eaton is a 82 y.o. female with a history of sarcoidosis in her left eye for which she takes steroid drops.  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  A CT scan of the chest with contrast on 07/17/2022 showed no evidence of pulmonary sarcoid.  She is here with about 1 week of shortness of breath.  She feels like there is a tightness in her throat and that she needs to cough something up but is unable to.  She has not had chest pain.  She has not had a fever or other flulike symptoms.  Symptoms worsened overnight and prevented her from sleeping.  She does have a history of environmental allergies.   Past Medical History:  Diagnosis Date   Arthritis    Asthma due to seasonal allergies    Cancer    skin cancer - face - basal   DM (diabetes mellitus)    Environmental allergies    History of kidney stones    1 small in each kidney - has had for years   Hypertension    Right clavicle fracture    Sarcoidosis    effecting the eyes    Past Surgical History:  Procedure Laterality Date   BRONCHOSCOPY      x 2 to determine if she has scarcoidosis   COLONOSCOPY W/ POLYPECTOMY     ORIF CLAVICULAR FRACTURE Right 07/12/2019   Procedure: OPEN REDUCTION INTERNAL FIXATION (ORIF) CLAVICULAR FRACTURE;  Surgeon: Nicholes Stairs, MD;  Location: [facility];  Service: Orthopedics;  Laterality: Right;    History reviewed. No pertinent family history.  Social History   Tobacco Use   Smoking status: Some Days    Packs/day: 1    Types: Cigarettes   Smokeless tobacco: Never  Vaping Use   Vaping Use: Never used  Substance Use Topics   Alcohol use: Not Currently   Drug use: Not Currently    Prior to Admission medications   Medication Sig Start  Date End Date Taking? Authorizing Provider  acetaminophen (TYLENOL) 500 MG tablet Take 500 mg by mouth every 6 (six) hours as needed for mild pain.    [provider]  brimonidine (ALPHAGAN) 0.2 % ophthalmic solution Place 1 drop into the left eye in the morning and at bedtime.    [provider]  citalopram (CELEXA) 20 MG tablet Take 20 mg by mouth daily.    [provider]  dorzolamide (TRUSOPT) 2 % ophthalmic solution Place 1 drop into the left eye 2 (two) times daily.    [provider]  doxylamine, Sleep, (UNISOM) 25 MG tablet Take 25 mg by mouth at bedtime.    [provider]  ezetimibe-simvastatin (VYTORIN) 10-40 MG tablet Take 1 tablet by mouth daily.    [provider]  HYDROcodone-acetaminophen (NORCO) 5-325 MG tablet Take 1-2 tablets by mouth every 6 (six) hours as needed. 06/29/19   Veryl Speak, MD  metFORMIN (GLUCOPHAGE) 500 MG tablet Take 500 mg by mouth in the morning, at noon, and at bedtime.    [provider]  metoprolol tartrate (LOPRESSOR) 50 MG tablet Take 50 mg by mouth daily.    [provider]  Multiple Vitamins-Minerals (CENTRUM VITAMINTS PO) Take 1 tablet by mouth daily.  [provider]  niacin (NIASPAN) 1000 MG CR tablet Take 1,000 mg by mouth daily.    [provider]  Omega-3 Fatty Acids (FISH OIL) 1000 MG CAPS Take 1 capsule by mouth in the morning, at noon, and at bedtime.    [provider]  ondansetron (ZOFRAN ODT) 4 MG disintegrating tablet Take 1 tablet (4 mg total) by mouth every 8 (eight) hours as needed. 07/12/19   Nicholes Stairs, MD  Polyethyl Glycol-Propyl Glycol (SYSTANE HYDRATION PF OP) Systane (PF)    [provider]  prednisoLONE acetate (PRED FORTE) 1 % ophthalmic suspension INSTILL 1 DROP IN THE LEFT EYE EVERY OTHER DAY 04/15/21   Rankin, Clent Demark, MD  timolol (BETIMOL) 0.5 % ophthalmic solution Place 1 drop into both eyes 2 (two) times  daily.    [provider]    Allergies Patient has no known allergies.   REVIEW OF SYSTEMS  Negative except as noted here or in the History of Present Illness.   PHYSICAL EXAMINATION  Initial Vital Signs Blood pressure 135/67, pulse 83, temperature 98.7 F (37.1 C), resp. rate 18, weight 45 kg, SpO2 92 %.  Examination General: Well-developed, well-nourished female in no acute distress; appearance consistent with age of record HENT: normocephalic; atraumatic Eyes: pupils equal, round and reactive to light; extraocular muscles intact; bilateral pseudophakia Neck: supple Heart: regular rate and rhythm Lungs: Decreased air movement without frank wheezing Abdomen: soft; nondistended; nontender; bowel sounds present Extremities: No deformity; full range of motion; pulses normal Neurologic: Awake, alert and oriented; motor function intact in all extremities and symmetric; no facial droop Skin: Warm and dry Psychiatric: Normal mood and affect   RESULTS  Summary of this visit's results, reviewed and interpreted by myself:   EKG Interpretation  Date/Time:  Tuesday July 29 2022 05:11:22 EDT Ventricular Rate:  86 PR Interval:  125 QRS Duration: 72 QT Interval:  350 QTC Calculation: 419 R Axis:   45 Text Interpretation: Sinus rhythm Baseline wander in lead(s) V2 No significant change was found Confirmed by Shanon Rosser [phone]) on 07/29/2022 5:17:47 AM       Laboratory Studies: No results found for this or any previous visit (from the past 24 hour(s)). Imaging Studies: DG Chest 2 View  Result Date: 07/29/2022 CLINICAL DATA:  Worsening shortness of breath with sinus drainage for 1 week EXAM: CHEST - 2 VIEW COMPARISON:  None Available. FINDINGS: Artifact from EKG leads. Normal heart size and mediastinal contours. No acute infiltrate or edema. No effusion or pneumothorax. No acute osseous findings. Remote right clavicle fracture and repair. Remote right rib fractures.  IMPRESSION: No evidence of active cardiopulmonary disease. Electronically Signed   By: Jorje Guild M.D.   On: 07/29/2022 05:52    ED COURSE and MDM  Nursing notes, initial and subsequent vitals signs, including pulse oximetry, reviewed and interpreted by myself.  Vitals:   07/29/22 0513 07/29/22 0515 07/29/22 0525  BP: 135/67    Pulse: 83    Resp: 18    Temp: 98.7 F (37.1 C)    SpO2: 92%  90%  Weight:  45 kg    Medications  ipratropium-albuterol (DUONEB) 0.5-2.5 (3) MG/3ML nebulizer solution 3 mL (3 mLs Nebulization Given 07/29/22 0525)  albuterol (VENTOLIN HFA) 108 (90 Base) MCG/ACT inhaler 2 puff (has no administration in time range)  aerochamber plus with mask device 1 each (has no administration in time range)  fluticasone (FLOVENT HFA) 44 MCG/ACT inhaler 2 puff (has no administration in time  range)   5:58 AM Air movement improved and patient feels better after DuoNeb treatment.  We will provide the patient with an albuterol inhaler and AeroChamber and instruct her in their use.  We will also start her on a Flovent inhaler as I suspect her symptoms are being triggered by allergies (it is now the start of pollen season).   PROCEDURES  Procedures   ED DIAGNOSES     ICD-10-CM   1. Acute bronchospasm  J98.01     2. Seasonal allergies  J30.2          Bellamie Turney, MD 07/29/22 [phone]

## 2022-07-29 NOTE — ED Triage Notes (Signed)
Pt presents for worsening shortness of breath and sinus drainage x 1 week. H/o sarcoids of the eye. Has had a recent CT of her chest which was negative.

## 2022-08-20 DIAGNOSIS — H401111 Primary open-angle glaucoma, right eye, mild stage: Secondary | ICD-10-CM | POA: Diagnosis not present

## 2022-08-20 DIAGNOSIS — H4052X3 Glaucoma secondary to other eye disorders, left eye, severe stage: Secondary | ICD-10-CM | POA: Diagnosis not present

## 2022-08-25 ENCOUNTER — Other Ambulatory Visit (INDEPENDENT_AMBULATORY_CARE_PROVIDER_SITE_OTHER): Payer: Self-pay | Admitting: Ophthalmology

## 2022-08-25 MED ORDER — PREDNISOLONE ACETATE 1 % OP SUSP: OPHTHALMIC | 6 refills | Status: AC

## 2022-10-01 DIAGNOSIS — J3489 Other specified disorders of nose and nasal sinuses: Secondary | ICD-10-CM | POA: Diagnosis not present

## 2022-10-01 DIAGNOSIS — J329 Chronic sinusitis, unspecified: Secondary | ICD-10-CM | POA: Diagnosis not present

## 2022-10-01 DIAGNOSIS — D869 Sarcoidosis, unspecified: Secondary | ICD-10-CM | POA: Diagnosis not present

## 2022-10-08 NOTE — Progress Notes (Signed)
[facility] - Clinic Note  10/14/2022    CHIEF COMPLAINT Patient presents for Retina Follow Up   HISTORY OF PRESENT ILLNESS: Sabrina Eaton is a 82 y.o. female who presents to the clinic today for:   HPI     Retina Follow Up   Patient presents with  Other.  In left eye.  This started years ago.  Since onset it is gradually worsening.  I, the attending physician,  performed the HPI with the patient and updated documentation appropriately.        Comments   Patient feels that the vision is slightly worse over all. She is using Brimonidine OS BID, Dorz OS BID, Timolol OU BID and Pred OS every other day. She has an appointment with Dr. Lottie Dawson next month. 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 She is currently being treated for that.       Last edited by Rennis Chris, MD on 10/14/2022 12:53 PM.    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 Luciana Axe managed the sarcoid after the dx, she has not managed the systemic side of things, she states she had an x ray on her chest a couple months ago, but she was told it was okay and her breathing issues were because of pollen, she feels like her left eye vision is down, she states she was recently told that the sarcoid is in her sinuses now, she has to flush her sinuses twice a day, but is not on any anti-inflammatory medication, pt is using drops for glaucoma, but no AT's   Referring physician: Marcelle Overlie, MD [address] [place],  [place] [address]  HISTORICAL INFORMATION:   Selected notes from the MEDICAL RECORD NUMBER Self referral -- transferring care from Dr. Luciana Axe LEE:  Ocular Hx- PMH-    CURRENT MEDICATIONS: Current Outpatient Medications (Ophthalmic Drugs)  Medication Sig   brimonidine (ALPHAGAN) 0.2 % ophthalmic solution Place 1 drop into the left eye in the morning and at bedtime.   dorzolamide (TRUSOPT) 2 % ophthalmic solution Place 1  drop into the left eye 2 (two) times daily.   Polyethyl Glycol-Propyl Glycol (SYSTANE HYDRATION PF OP) Systane (PF)   prednisoLONE acetate (PRED FORTE) 1 % ophthalmic suspension INSTILL 1 DROP IN THE LEFT EYE EVERY OTHER DAY   timolol (BETIMOL) 0.5 % ophthalmic solution Place 1 drop into both eyes 2 (two) times daily.   No current facility-administered medications for this visit. (Ophthalmic Drugs)   Current Outpatient Medications (Other)  Medication Sig   acetaminophen (TYLENOL) 500 MG tablet Take 500 mg by mouth every 6 (six) hours as needed for mild pain.   citalopram (CELEXA) 20 MG tablet Take 20 mg by mouth daily.   doxylamine, Sleep, (UNISOM) 25 MG tablet Take 25 mg by mouth at bedtime.   ezetimibe-simvastatin (VYTORIN) 10-40 MG tablet Take 1 tablet by mouth daily.   HYDROcodone-acetaminophen (NORCO) 5-325 MG tablet Take 1-2 tablets by mouth every 6 (six) hours as needed.   metFORMIN (GLUCOPHAGE) 500 MG tablet Take 500 mg by mouth in the morning, at noon, and at bedtime.   metoprolol tartrate (LOPRESSOR) 50 MG tablet Take 50 mg by mouth daily.   Multiple Vitamins-Minerals (CENTRUM VITAMINTS PO) Take 1 tablet by mouth daily.   niacin (NIASPAN) 1000 MG CR tablet Take 1,000 mg by mouth daily.   Omega-3 Fatty Acids (FISH OIL) 1000 MG CAPS Take 1  capsule by mouth in the morning, at noon, and at bedtime.   ondansetron (ZOFRAN ODT) 4 MG disintegrating tablet Take 1 tablet (4 mg total) by mouth every 8 (eight) hours as needed.   No current facility-administered medications for this visit. (Other)   REVIEW OF SYSTEMS: ROS   Positive for: Endocrine, Eyes Last edited by Charlette Caffey, COT on 10/14/2022  8:05 AM.     ALLERGIES No Known Allergies  PAST MEDICAL HISTORY Past Medical History:  Diagnosis Date   Arthritis    Asthma due to seasonal allergies    Cancer (HCC)    skin cancer - face - basal   DM (diabetes mellitus) (HCC)    Environmental allergies    History of kidney  stones    1 small in each kidney - has had for years   Hypertension    Right clavicle fracture    Sarcoidosis    effecting the eyes   Past Surgical History:  Procedure Laterality Date   BRONCHOSCOPY      x 2 to determine if she has scarcoidosis   COLONOSCOPY W/ POLYPECTOMY     ORIF CLAVICULAR FRACTURE Right 07/12/2019   Procedure: OPEN REDUCTION INTERNAL FIXATION (ORIF) CLAVICULAR FRACTURE;  Surgeon: Yolonda Kida, MD;  Location: [facility] OR;  Service: Orthopedics;  Laterality: Right;   FAMILY HISTORY History reviewed. No pertinent family history.  SOCIAL HISTORY Social History   Tobacco Use   Smoking status: Some Days    Packs/day: 1    Types: Cigarettes   Smokeless tobacco: Never  Vaping Use   Vaping Use: Never used  Substance Use Topics   Alcohol use: Not Currently   Drug use: Not Currently       OPHTHALMIC EXAM:  Base Eye Exam     Visual Acuity (Snellen - Linear)       Right Left   Dist cc 20/20 +1 20/100 -2   Dist ph cc  20/70    Correction: Glasses         Tonometry (Tonopen, 8:14 AM)       Right Left   Pressure 10 12         Pupils       Dark Light Shape React APD   Right 3 2 Round Slow None   Left 3 2 Round Slow None         Visual Fields       Left Right    Full Full         Extraocular Movement       Right Left    Full, Ortho Full, Ortho         Neuro/Psych     Oriented x3: Yes   Mood/Affect: Normal         Dilation     Both eyes: 1.0% Mydriacyl, 2.5% Phenylephrine @ 8:06 AM           Slit Lamp and Fundus Exam     External Exam       Right Left   External Normal trace periorbital edema         Slit Lamp Exam       Right Left   Lids/Lashes Dermatochalasis - upper lid, mild MGD Dermatochalasis - upper lid, mild MGD   Conjunctiva/Sclera White and quiet Trace Injection   Cornea arcus, well healed cataract wound, 2-3+ Punctate epithelial erosions, tear film debris arcus, early band K, 2-3+ fine  Punctate epithelial erosions   Anterior Chamber deep,  clear, narrow temporal angle deep and clear, no cell or flare   Iris Round and dilated Round and dilated   Lens Posterior chamber intraocular lens, 1-2+ Posterior capsular opacification non-central Posterior chamber intraocular lens   Anterior Vitreous Vitreous syneresis Vitreous syneresis, no cell         Fundus Exam       Right Left   Posterior Vitreous  Posterior vitreous detachment   Disc mild Pallor, Sharp rim, mild PPA 3+pallor, +cupping, very thin inferior rim, +PPA   C/D Ratio 0.6 0.9   Macula Flat, Blunted foveal reflex, Drusen, RPE mottling, rare MA, no edema Flat, Blunted foveal reflex, mild RPE mottling, No heme or edema   Vessels mild attenuation, mild tortuosity Severe attenuation, peripheral sclerosis, mild tortuosity   Periphery Attached, rare MA Attached, scattered, punched out atrophic lesions           Refraction     Wearing Rx       Sphere Cylinder Axis Add   Right +0.50 Sphere  +2.50   Left +1.00 +0.50 001 +2.50         Manifest Refraction       Sphere Cylinder Axis Dist VA   Right       Left +0.75 +0.50 005 20/80+2           IMAGING AND PROCEDURES  Imaging and Procedures for 10/14/2022  OCT, Retina - OU - Both Eyes       Right Eye Quality was good. Scan locations included subfoveal. Central Foveal Thickness: 233. Progression has been stable. Findings include normal foveal contour, no IRF, no SRF, retinal drusen (Rare drusen).   Left Eye Quality was good. Scan locations included subfoveal. Central Foveal Thickness: 178. Progression has been stable. Findings include normal foveal contour, no IRF, no SRF, inner retinal atrophy, outer retinal atrophy (Diffuse atrophy greatest non-centrally).   Notes *Images captured and stored on drive  Diagnosis / Impression:  NFP, no IRF/SRF OU OD: rare drusen OS: Diffuse atrophy greatest non-centrally  Clinical management:  See  below  Abbreviations: NFP - Normal foveal profile. CME - cystoid macular edema. PED - pigment epithelial detachment. IRF - intraretinal fluid. SRF - subretinal fluid. EZ - ellipsoid zone. ERM - epiretinal membrane. ORA - outer retinal atrophy. ORT - outer retinal tubulation. SRHM - subretinal hyper-reflective material. 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 Essential hypertension  I10     3. Hypertensive retinopathy of both eyes  H35.033     4. Moderate nonproliferative diabetic retinopathy of both eyes without macular edema associated with type 2 diabetes mellitus (HCC)  E11.3393 OCT, Retina - OU - Both Eyes    5. Long term (current) use of oral hypoglycemic drugs  Z79.84     6. Secondary open-angle glaucoma  H40.50X0     7. Primary open angle glaucoma (POAG) of right eye, mild stage  H40.1111     8. Pseudophakia of both eyes  Z96.1      1. History of sarcoid uveitis, left eye  - per pt report, onset ~late 1990s  - history of lung biopsy proven sarcoid in 1998 -- but pt has never been managed or treated systemically  - uveitis diagnosed and managed by Dr. 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 Hypertensive retinopathy OU - discussed importance of tight BP control - monitor  4,5. Mild nonproliferative diabetic retinopathy w/o DME, both eyes - The incidence, risk factors for progression, natural history and treatment options for diabetic retinopathy  were discussed with patient.   - The need for close monitoring of blood glucose, blood pressure, and serum lipids, avoiding cigarette or any type of tobacco, and the need for long term follow up was also discussed with patient. - exam with rare MA OU  - FA (12.12.23) shows OD: Scattered peripheral MA -- mild, No leakage, no NV; OS: No leakage -- no active inflammation or vasculitis - OCT without diabetic macular edema, both eyes  - f/u in 6 mos -- DFE/OCT  6,7. Secondary open angle glaucoma OS, POAG OD  - under the expert care of Dr. Lottie Dawson  - IOP 10,12 - Timolol BID OU - Dorzolamide BID OS  - Brimonidine BID OS   8. Pseudophakia OU  - s/p CE/IOL OU  - IOL in good position, doing well  - monitor  Ophthalmic Meds Ordered this visit:  No orders of the defined types were placed in this encounter.    Return in about 6 months (around 04/15/2023) for f/u sarcoid uveitis OS / NPDR OU, DFE, OCT.  There are no Patient Instructions on file for this visit.   Explained the diagnoses, plan, and follow up with the patient and they expressed understanding.  Patient expressed understanding of the importance of proper follow up care.   This document serves as a record of services personally performed by Karie Chimera, MD, PhD. It was created on their behalf by Gerilyn Nestle, COT an ophthalmic technician. The creation of this record is the provider's dictation and/or activities during the visit.    Electronically signed by:  Laurey Morale, COT  06.12.24  12:54 PM   This document serves as a record of services personally performed by Karie Chimera, MD, PhD. It was created on their behalf by Glee Arvin. Manson Passey, OA an ophthalmic technician. The creation of this record is the provider's dictation and/or activities during the visit.    Electronically signed by: Glee Arvin. Kristopher Oppenheim 06.18.2024 12:54 PM  Karie Chimera, M.D., Ph.D. Diseases & Surgery of the Retina and Vitreous [facility]  I have reviewed the above documentation for accuracy and completeness, and I agree with the above. Karie Chimera, M.D., Ph.D. 10/14/22 1:01 PM  Abbreviations: M myopia (nearsighted); A astigmatism; H hyperopia (farsighted); P presbyopia; Mrx spectacle prescription;  CTL contact lenses; OD right eye; OS left eye; OU both eyes  XT exotropia; ET esotropia; PEK punctate epithelial keratitis; PEE punctate epithelial erosions; DES dry eye syndrome; MGD meibomian gland dysfunction; ATs artificial tears; PFAT's preservative free artificial tears; NSC nuclear sclerotic cataract; PSC posterior subcapsular cataract; ERM epi-retinal membrane; PVD posterior vitreous detachment; RD retinal detachment; DM diabetes mellitus; DR diabetic retinopathy; NPDR non-proliferative diabetic retinopathy; PDR proliferative diabetic retinopathy; CSME clinically significant macular edema; DME diabetic macular edema; dbh dot blot hemorrhages;  CWS cotton wool spot; POAG primary open angle glaucoma; C/D cup-to-disc ratio; HVF humphrey visual field; GVF goldmann visual field; OCT optical coherence tomography; IOP intraocular pressure; BRVO Branch retinal vein occlusion; CRVO central retinal vein occlusion; CRAO central retinal artery occlusion; BRAO branch retinal artery occlusion; RT retinal tear; SB scleral buckle; PPV pars plana vitrectomy; VH Vitreous hemorrhage; PRP panretinal laser photocoagulation; IVK intravitreal kenalog; VMT vitreomacular traction; MH Macular hole;  NVD neovascularization of the disc; NVE neovascularization elsewhere; AREDS age related eye disease study; ARMD age related macular degeneration; POAG primary open angle glaucoma; EBMD epithelial/anterior basement membrane dystrophy; ACIOL anterior chamber intraocular lens; IOL intraocular lens; PCIOL posterior chamber intraocular lens; Phaco/IOL phacoemulsification with intraocular lens placement; PRK photorefractive keratectomy; LASIK laser  assisted in situ keratomileusis; HTN hypertension; DM diabetes mellitus; COPD chronic obstructive pulmonary disease

## 2022-10-14 ENCOUNTER — Ambulatory Visit (INDEPENDENT_AMBULATORY_CARE_PROVIDER_SITE_OTHER): Payer: Medicare Other | Admitting: Ophthalmology

## 2022-10-14 ENCOUNTER — Encounter (INDEPENDENT_AMBULATORY_CARE_PROVIDER_SITE_OTHER): Payer: Self-pay | Admitting: Ophthalmology

## 2022-10-14 DIAGNOSIS — Z961 Presence of intraocular lens: Secondary | ICD-10-CM

## 2022-10-14 DIAGNOSIS — H401111 Primary open-angle glaucoma, right eye, mild stage: Secondary | ICD-10-CM

## 2022-10-14 DIAGNOSIS — E113393 Type 2 diabetes mellitus with moderate nonproliferative diabetic retinopathy without macular edema, bilateral: Secondary | ICD-10-CM | POA: Diagnosis not present

## 2022-10-14 DIAGNOSIS — H4050X0 Glaucoma secondary to other eye disorders, unspecified eye, stage unspecified: Secondary | ICD-10-CM

## 2022-10-14 DIAGNOSIS — D8683 Sarcoid iridocyclitis: Secondary | ICD-10-CM | POA: Diagnosis not present

## 2022-10-14 DIAGNOSIS — H35033 Hypertensive retinopathy, bilateral: Secondary | ICD-10-CM | POA: Diagnosis not present

## 2022-10-14 DIAGNOSIS — I10 Essential (primary) hypertension: Secondary | ICD-10-CM | POA: Diagnosis not present

## 2022-10-14 DIAGNOSIS — Z7984 Long term (current) use of oral hypoglycemic drugs: Secondary | ICD-10-CM

## 2022-11-12 ENCOUNTER — Ambulatory Visit (INDEPENDENT_AMBULATORY_CARE_PROVIDER_SITE_OTHER): Payer: Medicare Other | Admitting: Ophthalmology

## 2022-11-12 ENCOUNTER — Encounter (INDEPENDENT_AMBULATORY_CARE_PROVIDER_SITE_OTHER): Payer: Self-pay | Admitting: Ophthalmology

## 2022-11-12 DIAGNOSIS — H4050X0 Glaucoma secondary to other eye disorders, unspecified eye, stage unspecified: Secondary | ICD-10-CM | POA: Diagnosis not present

## 2022-11-12 DIAGNOSIS — H401111 Primary open-angle glaucoma, right eye, mild stage: Secondary | ICD-10-CM

## 2022-11-12 DIAGNOSIS — H35033 Hypertensive retinopathy, bilateral: Secondary | ICD-10-CM

## 2022-11-12 DIAGNOSIS — Z7984 Long term (current) use of oral hypoglycemic drugs: Secondary | ICD-10-CM

## 2022-11-12 DIAGNOSIS — I10 Essential (primary) hypertension: Secondary | ICD-10-CM | POA: Diagnosis not present

## 2022-11-12 DIAGNOSIS — E113393 Type 2 diabetes mellitus with moderate nonproliferative diabetic retinopathy without macular edema, bilateral: Secondary | ICD-10-CM

## 2022-11-12 DIAGNOSIS — D8683 Sarcoid iridocyclitis: Secondary | ICD-10-CM

## 2022-11-12 DIAGNOSIS — Z961 Presence of intraocular lens: Secondary | ICD-10-CM

## 2022-11-12 NOTE — Progress Notes (Signed)
[facility] - Clinic Note  11/12/2022    CHIEF COMPLAINT Patient presents for Retina Follow Up   HISTORY OF PRESENT ILLNESS: Sabrina Eaton is a 82 y.o. female who presents to the clinic today for:   HPI     Retina Follow Up   Patient presents with  Other.  In left eye.  This started years ago.  Duration of 1 month.  Since onset it is gradually worsening.  I, the attending physician,  performed the HPI with the patient and updated documentation appropriately.        Comments   Patient states that all of a sudden the vision is cloudy/blurry and it feels that the eye lid is partially covering the eye. She feels that when lifting the lid the vision is still a little cloudy/burry. She is using Brimonidine OS BID, Trusopt OS BID, Timolol OU BID, Pred OS every other day. Her blood sugar was 115.      Last edited by Rennis Chris, MD on 11/12/2022 12:57 PM.    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 Luciana Axe managed the sarcoid after the dx, she has not managed the systemic side of things, she states she had an x ray on her chest a couple months ago, but she was told it was okay and her breathing issues were because of pollen, she feels like her left eye vision is down, she states she was recently told that the sarcoid is in her sinuses now, she has to flush her sinuses twice a day, but is not on any anti-inflammatory medication, pt is using drops for glaucoma, but no AT's   Referring physician: Marcelle Overlie, MD [address] [place],  [place] [address]  HISTORICAL INFORMATION:   Selected notes from the MEDICAL RECORD NUMBER Self referral -- transferring care from Dr. Luciana Axe LEE:  Ocular Hx- PMH-    CURRENT MEDICATIONS: Current Outpatient Medications (Ophthalmic Drugs)  Medication Sig   brimonidine (ALPHAGAN) 0.2 % ophthalmic solution Place 1 drop into the left eye in the morning and at  bedtime.   dorzolamide (TRUSOPT) 2 % ophthalmic solution Place 1 drop into the left eye 2 (two) times daily.   Polyethyl Glycol-Propyl Glycol (SYSTANE HYDRATION PF OP) Systane (PF)   prednisoLONE acetate (PRED FORTE) 1 % ophthalmic suspension INSTILL 1 DROP IN THE LEFT EYE EVERY OTHER DAY   timolol (BETIMOL) 0.5 % ophthalmic solution Place 1 drop into both eyes 2 (two) times daily.   No current facility-administered medications for this visit. (Ophthalmic Drugs)   Current Outpatient Medications (Other)  Medication Sig   acetaminophen (TYLENOL) 500 MG tablet Take 500 mg by mouth every 6 (six) hours as needed for mild pain.   citalopram (CELEXA) 20 MG tablet Take 20 mg by mouth daily.   doxylamine, Sleep, (UNISOM) 25 MG tablet Take 25 mg by mouth at bedtime.   ezetimibe-simvastatin (VYTORIN) 10-40 MG tablet Take 1 tablet by mouth daily.   HYDROcodone-acetaminophen (NORCO) 5-325 MG tablet Take 1-2 tablets by mouth every 6 (six) hours as needed.   metFORMIN (GLUCOPHAGE) 500 MG tablet Take 500 mg by mouth in the morning, at noon, and at bedtime.   metoprolol tartrate (LOPRESSOR) 50 MG tablet Take 50 mg by mouth daily.   Multiple Vitamins-Minerals (CENTRUM VITAMINTS PO) Take 1 tablet by mouth daily.   niacin (NIASPAN) 1000 MG CR tablet Take 1,000 mg by mouth  daily.   Omega-3 Fatty Acids (FISH OIL) 1000 MG CAPS Take 1 capsule by mouth in the morning, at noon, and at bedtime.   ondansetron (ZOFRAN ODT) 4 MG disintegrating tablet Take 1 tablet (4 mg total) by mouth every 8 (eight) hours as needed.   No current facility-administered medications for this visit. (Other)   REVIEW OF SYSTEMS: ROS   Positive for: Endocrine, Eyes Last edited by Charlette Caffey, COT on 11/12/2022 10:14 AM.     ALLERGIES No Known Allergies  PAST MEDICAL HISTORY Past Medical History:  Diagnosis Date   Arthritis    Asthma due to seasonal allergies    Cancer (HCC)    skin cancer - face - basal   DM (diabetes  mellitus) (HCC)    Environmental allergies    History of kidney stones    1 small in each kidney - has had for years   Hypertension    Right clavicle fracture    Sarcoidosis    effecting the eyes   Past Surgical History:  Procedure Laterality Date   BRONCHOSCOPY      x 2 to determine if she has scarcoidosis   COLONOSCOPY W/ POLYPECTOMY     ORIF CLAVICULAR FRACTURE Right 07/12/2019   Procedure: OPEN REDUCTION INTERNAL FIXATION (ORIF) CLAVICULAR FRACTURE;  Surgeon: Yolonda Kida, MD;  Location: [facility] OR;  Service: Orthopedics;  Laterality: Right;   FAMILY HISTORY History reviewed. No pertinent family history.  SOCIAL HISTORY Social History   Tobacco Use   Smoking status: Some Days    Current packs/day: 1.00    Types: Cigarettes   Smokeless tobacco: Never  Vaping Use   Vaping status: Never Used  Substance Use Topics   Alcohol use: Not Currently   Drug use: Not Currently       OPHTHALMIC EXAM:  Base Eye Exam     Visual Acuity (Snellen - Linear)       Right Left   Dist cc 20/25 20/80 +2   Dist ph cc NI 20/60 +2    Correction: Glasses         Tonometry (Tonopen, 10:18 AM)       Right Left   Pressure 11 12         Pupils       Dark Light Shape React APD   Right 3 2 Round Slow None   Left 3 2 Round Slow None         Neuro/Psych     Oriented x3: Yes   Mood/Affect: Normal           Slit Lamp and Fundus Exam     External Exam       Right Left   External Normal trace periorbital edema         Slit Lamp Exam       Right Left   Lids/Lashes Dermatochalasis - upper lid, mild MGD Dermatochalasis - upper lid, mild MGD, Ptosis   Conjunctiva/Sclera White and quiet Trace Injection   Cornea arcus, well healed cataract wound, 1-2+ Punctate epithelial erosions, tear film debris arcus, early band K, 2+ fine Punctate epithelial erosions   Anterior Chamber deep, clear, narrow temporal angle deep and clear, no cell or flare   Iris Round and  dilated Round and dilated   Lens Posterior chamber intraocular lens, 1-2+ Posterior capsular opacification non-central Posterior chamber intraocular lens   Anterior Vitreous Vitreous syneresis Vitreous syneresis, no cell         Fundus Exam  Right Left   Posterior Vitreous Posterior vitreous detachment Posterior vitreous detachment   Disc mild Pallor, Sharp rim, mild PPA 3+pallor, +cupping, very thin inferior rim, +PPA   C/D Ratio 0.6 0.95   Macula Flat, Blunted foveal reflex, Drusen, RPE mottling, rare MA, no edema Flat, Blunted foveal reflex, mild RPE mottling, No heme or edema   Vessels mild attenuation, mild tortuosity Severe attenuation, peripheral sclerosis, mild tortuosity   Periphery Attached, rare MA Attached, scattered, punched out atrophic lesions           IMAGING AND PROCEDURES  Imaging and Procedures for 11/12/2022  OCT, Retina - OU - Both Eyes       Right Eye Quality was good. Scan locations included subfoveal. Central Foveal Thickness: 229. Progression has been stable. Findings include normal foveal contour, no IRF, no SRF, retinal drusen (Rare drusen).   Left Eye Quality was good. Scan locations included subfoveal. Central Foveal Thickness: 177. Progression has been stable. Findings include normal foveal contour, no IRF, no SRF, inner retinal atrophy, outer retinal atrophy (Diffuse atrophy greatest non-centrally, patchy ellipsoid signal. ).   Notes *Images captured and stored on drive  Diagnosis / Impression:  NFP, no IRF/SRF OU OD: rare drusen OS: Diffuse atrophy greatest non-centrally  Clinical management:  See below  Abbreviations: NFP - Normal foveal profile. CME - cystoid macular edema. PED - pigment epithelial detachment. IRF - intraretinal fluid. SRF - subretinal fluid. EZ - ellipsoid zone. ERM - epiretinal membrane. ORA - outer retinal atrophy. ORT - outer retinal tubulation. SRHM - subretinal hyper-reflective material. IRHM - intraretinal  hyper-reflective material       Humphrey Visual Field - OU - Both Eyes       Right Eye Reliability was good. Progression has no prior data. Findings include inferior paracentral defect.   Left Eye Reliability was good. Progression has no prior data. Findings include superior altitudinal defect, inferior paracentral defect.   Notes HVF 24-2 OU SITA-STD  OD Fixation losses: 0/14 False positive errors: 1% False negative errors: 0% Findings: Mild inferior paracentral defect.   OS Fixation losses: 1/15 False positive errors: 1% False negative errors: 0% Findings: Superior altitudinal defect, inferior paracentral defect.      Fluorescein Angiography Optos (Transit OS)       Right Eye Progression has been stable. Early phase findings include microaneurysm. Mid/Late phase findings include microaneurysm (Scattered peripheral MA -- mild, No leakage, no NV).   Left Eye Progression has been stable. Early phase findings include delayed filling, staining, window defect, vascular perfusion defect (Severe vascular perfusion defect 360 periphery extending posteriorly to arcades. ). Mid/Late phase findings include staining, window defect, vascular perfusion defect (No leakage -- no active inflammation or vasculitis).   Notes **Images stored on drive**  Impression: OD: Scattered peripheral MA -- mild, No leakage, no NV OS: Severe vascular perfusion defect 360 periphery extending posteriorly to arcades. No leakage -- no active inflammation or vasculitis.           ASSESSMENT/PLAN:    ICD-10-CM   1. 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 Essential hypertension  I10     3. Hypertensive retinopathy of both eyes  H35.033 OCT, Retina - OU - Both Eyes    Fluorescein Angiography Optos (Transit OS)    4. Moderate nonproliferative diabetic retinopathy of both eyes without macular edema associated with  type 2 diabetes mellitus (  HCC)  E11.3393 OCT, Retina - OU - Both Eyes    5. Long term (current) use of oral hypoglycemic drugs  Z79.84     6. Secondary open-angle glaucoma  H40.50X0 Humphrey Visual Field - OU - Both Eyes    7. Primary open angle glaucoma (POAG) of right eye, mild stage  H40.1111 Humphrey Visual Field - OU - Both Eyes    8. Pseudophakia of both eyes  Z96.1      **Pt presents acutely for decreased vision and superior visual field loss OS**  - see #6,7 for discussion of glaucoma  - retinal exam and imaging stable  1. History of sarcoid uveitis, left eye  - per pt report, onset ~late 1990s  - history of lung biopsy proven sarcoid in 1998 -- but pt has never been managed or treated systemically  - uveitis diagnosed and managed by Dr. Luciana Axe  - has been inactive for quite some time  - currently on predForte 1 drop, every other day, for maintenance  - pt reports recurrence of inflammation when she last tried to stop PredForte  - no active inflammation on exam or OCT today  - FA 12.15.23 without inflammation or leakage  - repeat FA 07.17.24 showed OD: Scattered peripheral MA -- mild, No leakage, no NV, OS: Severe vascular perfusion defect 360 periphery extending posteriorly to arcades. 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 Hypertensive retinopathy OU - discussed importance of tight BP control - monitor  4,5. Mild nonproliferative diabetic retinopathy w/o DME, both eyes - The incidence, risk factors for progression, natural history and treatment options for diabetic retinopathy were discussed with patient.   - The need for close monitoring of blood glucose, blood pressure, and serum lipids, avoiding cigarette or any type of tobacco, and the need for long term follow up was also discussed with  patient. - exam with rare MA OU  - FA (12.12.23) shows OD: Scattered peripheral MA -- mild, No leakage, no NV; OS: No leakage -- no active inflammation or vasculitis - repeat FA 07.17.24 showed OD: Scattered peripheral MA -- mild, No leakage, no NV, OS: Severe vascular perfusion defect 360 periphery extending posteriorly to arcades. No leakage or vasculitis OU - OCT without diabetic macular edema, both eyes  - f/u in 6 mos -- DFE/OCT  6,7. Secondary open angle glaucoma OS, POAG OD  - under the expert care of Dr. Lottie Dawson  - Pt presented on 7.17.24 acutely for increased blurriness and decreased superior visual field in OS. HVF 24-2 was performed showing severe superior altitudinal defect  - BCVA OS 20/60 +2 from 20/70  - IOP 11,12  - review of notes from Dr. Lottie Dawson indicate presence of superior altitudinal defect on multiple HVFs - Timolol BID OU - Dorzolamide BID OS  - Brimonidine BID OS  - discussed findings and possibility of glaucoma progression driving visual symptoms - recommend f/u with Dr. Lottie Dawson for further eval  8. Pseudophakia OU  - s/p CE/IOL OU  - IOL in good position, doing well  - monitor  Ophthalmic Meds Ordered this visit:  No orders of the defined types were placed in this encounter.    Return for see as scheduled in December .  There are no Patient Instructions on file for this visit.   Explained the  diagnoses, plan, and follow up with the patient and they expressed understanding.  Patient expressed understanding of the importance of proper follow up care.    This document serves as a record of services personally performed by Karie Chimera, MD, PhD. It was created on their behalf by De Blanch, an ophthalmic technician. The creation of this record is the provider's dictation and/or activities during the visit.    Electronically signed by: De Blanch, OA, 11/12/22  1:00 PM  Karie Chimera, M.D., Ph.D. Diseases & Surgery of the Retina and  Vitreous [facility]  I have reviewed the above documentation for accuracy and completeness, and I agree with the above. Karie Chimera, M.D., Ph.D. 11/12/22 1:07 PM   Abbreviations: M myopia (nearsighted); A astigmatism; H hyperopia (farsighted); P presbyopia; Mrx spectacle prescription;  CTL contact lenses; OD right eye; OS left eye; OU both eyes  XT exotropia; ET esotropia; PEK punctate epithelial keratitis; PEE punctate epithelial erosions; DES dry eye syndrome; MGD meibomian gland dysfunction; ATs artificial tears; PFAT's preservative free artificial tears; NSC nuclear sclerotic cataract; PSC posterior subcapsular cataract; ERM epi-retinal membrane; PVD posterior vitreous detachment; RD retinal detachment; DM diabetes mellitus; DR diabetic retinopathy; NPDR non-proliferative diabetic retinopathy; PDR proliferative diabetic retinopathy; CSME clinically significant macular edema; DME diabetic macular edema; dbh dot blot hemorrhages; CWS cotton wool spot; POAG primary open angle glaucoma; C/D cup-to-disc ratio; HVF humphrey visual field; GVF goldmann visual field; OCT optical coherence tomography; IOP intraocular pressure; BRVO Branch retinal vein occlusion; CRVO central retinal vein occlusion; CRAO central retinal artery occlusion; BRAO branch retinal artery occlusion; RT retinal tear; SB scleral buckle; PPV pars plana vitrectomy; VH Vitreous hemorrhage; PRP panretinal laser photocoagulation; IVK intravitreal kenalog; VMT vitreomacular traction; MH Macular hole;  NVD neovascularization of the disc; NVE neovascularization elsewhere; AREDS age related eye disease study; ARMD age related macular degeneration; POAG primary open angle glaucoma; EBMD epithelial/anterior basement membrane dystrophy; ACIOL anterior chamber intraocular lens; IOL intraocular lens; PCIOL posterior chamber intraocular lens; Phaco/IOL phacoemulsification with intraocular lens placement; PRK photorefractive  keratectomy; LASIK laser assisted in situ keratomileusis; HTN hypertension; DM diabetes mellitus; COPD chronic obstructive pulmonary disease

## 2022-11-19 DIAGNOSIS — H4052X3 Glaucoma secondary to other eye disorders, left eye, severe stage: Secondary | ICD-10-CM | POA: Diagnosis not present

## 2022-11-19 DIAGNOSIS — H401111 Primary open-angle glaucoma, right eye, mild stage: Secondary | ICD-10-CM | POA: Diagnosis not present

## 2023-01-14 DIAGNOSIS — D869 Sarcoidosis, unspecified: Secondary | ICD-10-CM | POA: Diagnosis not present

## 2023-02-03 DIAGNOSIS — Z6820 Body mass index (BMI) 20.0-20.9, adult: Secondary | ICD-10-CM | POA: Diagnosis not present

## 2023-02-03 DIAGNOSIS — R87612 Low grade squamous intraepithelial lesion on cytologic smear of cervix (LGSIL): Secondary | ICD-10-CM | POA: Insufficient documentation

## 2023-02-03 DIAGNOSIS — Z124 Encounter for screening for malignant neoplasm of cervix: Secondary | ICD-10-CM | POA: Diagnosis not present

## 2023-02-03 DIAGNOSIS — Z01419 Encounter for gynecological examination (general) (routine) without abnormal findings: Secondary | ICD-10-CM | POA: Diagnosis not present

## 2023-02-06 DIAGNOSIS — R438 Other disturbances of smell and taste: Secondary | ICD-10-CM | POA: Diagnosis not present

## 2023-02-06 DIAGNOSIS — J0100 Acute maxillary sinusitis, unspecified: Secondary | ICD-10-CM | POA: Diagnosis not present

## 2023-02-06 DIAGNOSIS — D869 Sarcoidosis, unspecified: Secondary | ICD-10-CM | POA: Diagnosis not present

## 2023-02-06 DIAGNOSIS — R430 Anosmia: Secondary | ICD-10-CM | POA: Diagnosis not present

## 2023-03-17 DIAGNOSIS — R87619 Unspecified abnormal cytological findings in specimens from cervix uteri: Secondary | ICD-10-CM | POA: Diagnosis not present

## 2023-03-17 DIAGNOSIS — N72 Inflammatory disease of cervix uteri: Secondary | ICD-10-CM | POA: Diagnosis not present

## 2023-03-30 DIAGNOSIS — H401111 Primary open-angle glaucoma, right eye, mild stage: Secondary | ICD-10-CM | POA: Diagnosis not present

## 2023-03-30 DIAGNOSIS — H4052X3 Glaucoma secondary to other eye disorders, left eye, severe stage: Secondary | ICD-10-CM | POA: Diagnosis not present

## 2023-04-03 DIAGNOSIS — J3489 Other specified disorders of nose and nasal sinuses: Secondary | ICD-10-CM | POA: Diagnosis not present

## 2023-04-09 NOTE — Progress Notes (Signed)
[facility] - Clinic Note  04/15/2023    CHIEF COMPLAINT Patient presents for Retina Follow Up   HISTORY OF PRESENT ILLNESS: Sabrina Eaton is a 82 y.o. female who presents to the clinic today for:   HPI     Retina Follow Up   Patient presents with  Other.  In left eye.  Severity is moderate.  Duration of 6 months.  Since onset it is stable.  I, the attending physician,  performed the HPI with the patient and updated documentation appropriately.        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 Pt states VA is stable, no changes noticed. Pt reports drop usage: PF every other day OS, timolol BID OU, dorz BID OS, brim BID OS. Pt did have recent blood work but unsure what her A1C is.       Last edited by Rennis Chris, MD on 04/17/2023 12:29 AM.    Pt states her vision is "bad", she saw a pulmonologist in September, but they did not do anything, she states the dr told her that the medication he would give her would mess up her cholesterol, blood sugar and her glaucoma and bc of her age, he did not recommend it, she is using AT's BID OU   Referring physician: Marcelle Overlie, MD [address] [place],  [place] [address]  HISTORICAL INFORMATION:   Selected notes from the MEDICAL RECORD NUMBER Self referral -- transferring care from Dr. Luciana Axe LEE:  Ocular Hx- PMH-    CURRENT MEDICATIONS: Current Outpatient Medications (Ophthalmic Drugs)  Medication Sig   brimonidine (ALPHAGAN) 0.2 % ophthalmic solution Place 1 drop into the left eye in the morning and at bedtime.   dorzolamide (TRUSOPT) 2 % ophthalmic solution Place 1 drop into the left eye 2 (two) times daily.   Polyethyl Glycol-Propyl Glycol (SYSTANE HYDRATION PF OP) Systane (PF)   prednisoLONE acetate (PRED FORTE) 1 % ophthalmic suspension INSTILL 1 DROP IN THE LEFT EYE EVERY OTHER DAY   timolol (BETIMOL) 0.5 % ophthalmic solution Place 1 drop into both eyes 2 (two) times  daily.   No current facility-administered medications for this visit. (Ophthalmic Drugs)   Current Outpatient Medications (Other)  Medication Sig   acetaminophen (TYLENOL) 500 MG tablet Take 500 mg by mouth every 6 (six) hours as needed for mild pain.   citalopram (CELEXA) 20 MG tablet Take 20 mg by mouth daily.   doxylamine, Sleep, (UNISOM) 25 MG tablet Take 25 mg by mouth at bedtime.   ezetimibe-simvastatin (VYTORIN) 10-40 MG tablet Take 1 tablet by mouth daily.   HYDROcodone-acetaminophen (NORCO) 5-325 MG tablet Take 1-2 tablets by mouth every 6 (six) hours as needed.   metFORMIN (GLUCOPHAGE) 500 MG tablet Take 500 mg by mouth in the morning, at noon, and at bedtime.   metoprolol tartrate (LOPRESSOR) 50 MG tablet Take 50 mg by mouth daily.   Multiple Vitamins-Minerals (CENTRUM VITAMINTS PO) Take 1 tablet by mouth daily.   niacin (NIASPAN) 1000 MG CR tablet Take 1,000 mg by mouth daily.   Omega-3 Fatty Acids (FISH OIL) 1000 MG CAPS Take 1 capsule by mouth in the morning, at noon, and at bedtime.   ondansetron (ZOFRAN ODT) 4 MG disintegrating tablet Take 1 tablet (4 mg total) by mouth every 8 (eight) hours as needed.   No current facility-administered medications for this visit. (Other)   REVIEW OF SYSTEMS: ROS   Positive for:  Eyes Negative for: Constitutional, Gastrointestinal, Neurological, Skin, Genitourinary, Musculoskeletal, HENT, Endocrine, Cardiovascular, Respiratory, Psychiatric, Allergic/Imm, Heme/Lymph Last edited by Thompson Grayer, COT on 04/15/2023  8:07 AM.      ALLERGIES No Known Allergies  PAST MEDICAL HISTORY Past Medical History:  Diagnosis Date   Arthritis    Asthma due to seasonal allergies    Cancer (HCC)    skin cancer - face - basal   DM (diabetes mellitus) (HCC)    Environmental allergies    History of kidney stones    1 small in each kidney - has had for years   Hypertension    Right clavicle fracture    Sarcoidosis    effecting the eyes    Past Surgical History:  Procedure Laterality Date   BRONCHOSCOPY      x 2 to determine if she has scarcoidosis   COLONOSCOPY W/ POLYPECTOMY     ORIF CLAVICULAR FRACTURE Right 07/12/2019   Procedure: OPEN REDUCTION INTERNAL FIXATION (ORIF) CLAVICULAR FRACTURE;  Surgeon: Yolonda Kida, MD;  Location: [facility] OR;  Service: Orthopedics;  Laterality: Right;   FAMILY HISTORY History reviewed. No pertinent family history.  SOCIAL HISTORY Social History   Tobacco Use   Smoking status: Some Days    Current packs/day: 1.00    Types: Cigarettes   Smokeless tobacco: Never  Vaping Use   Vaping status: Never Used  Substance Use Topics   Alcohol use: Not Currently   Drug use: Not Currently       OPHTHALMIC EXAM:  Base Eye Exam     Visual Acuity (Snellen - Linear)       Right Left   Dist [place] 20/25 +2 20/200 -1   Dist ph  NI 20/150    Correction: Glasses         Tonometry (Tonopen, 8:17 AM)       Right Left   Pressure 12 9         Pupils       Pupils Dark Light Shape React APD   Right PERRL 3 2 Round Slow None   Left PERRL 3 2 Round Slow None         Visual Fields (Counting fingers)       Left Right    Full Full         Extraocular Movement       Right Left    Full, Ortho Full, Ortho         Neuro/Psych     Oriented x3: Yes   Mood/Affect: Normal         Dilation     Both eyes: 1.0% Mydriacyl, 2.5% Phenylephrine @ 8:17 AM           Slit Lamp and Fundus Exam     External Exam       Right Left   External Normal trace periorbital edema         Slit Lamp Exam       Right Left   Lids/Lashes Dermatochalasis - upper lid, mild MGD Dermatochalasis - upper lid, mild MGD, Ptosis   Conjunctiva/Sclera White and quiet White and quiet   Cornea arcus, well healed cataract wound, 2-3+ Punctate epithelial erosions, tear film debris arcus, early band K, 3+ fine Punctate epithelial erosions, irregular epi   Anterior Chamber deep, clear,  narrow temporal angle deep and clear, no cell or flare   Iris Round and dilated Round and dilated   Lens Posterior chamber intraocular lens, 1-2+ Posterior  capsular opacification non-central Posterior chamber intraocular lens   Anterior Vitreous Vitreous syneresis Vitreous syneresis, no cell         Fundus Exam       Right Left   Posterior Vitreous Posterior vitreous detachment Posterior vitreous detachment   Disc mild Pallor, Sharp rim, mild PPA 3+pallor, +cupping, very thin inferior rim, +PPA   C/D Ratio 0.6 0.95   Macula Flat, Blunted foveal reflex, Drusen, RPE mottling, rare MA, no edema Flat, Blunted foveal reflex, mild RPE mottling, No heme or edema   Vessels mild attenuation, mild tortuosity Severe attenuation, peripheral sclerosis, mild tortuosity   Periphery Attached, rare MA Attached, scattered, punched out atrophic lesions, No heme           Refraction     Wearing Rx       Sphere Cylinder Axis Add   Right +0.50 Sphere  +2.50   Left +1.00 +0.50 001 +2.50         Manifest Refraction       Sphere Cylinder Dist VA   Right      Left +1.00 Sphere NI  Unable to refract better OS MS          IMAGING AND PROCEDURES  Imaging and Procedures for 04/15/2023  OCT, Retina - OU - Both Eyes       Right Eye Quality was good. Scan locations included subfoveal. Central Foveal Thickness: 229. Progression has been stable. Findings include normal foveal contour, no IRF, no SRF, retinal drusen (Rare drusen).   Left Eye Quality was borderline. Scan locations included subfoveal. Central Foveal Thickness: 177. Progression has been stable. Findings include normal foveal contour, no IRF, no SRF, inner retinal atrophy, outer retinal atrophy (Diffuse atrophy greatest non-centrally, patchy ellipsoid signal. ).   Notes *Images captured and stored on drive  Diagnosis / Impression:  NFP, no IRF/SRF OU OD: rare drusen OS: Diffuse atrophy greatest non-centrally  Clinical  management:  See below  Abbreviations: NFP - Normal foveal profile. CME - cystoid macular edema. PED - pigment epithelial detachment. IRF - intraretinal fluid. SRF - subretinal fluid. EZ - ellipsoid zone. ERM - epiretinal membrane. ORA - outer retinal atrophy. ORT - outer retinal tubulation. SRHM - subretinal hyper-reflective material. IRHM - intraretinal hyper-reflective material             ASSESSMENT/PLAN:    ICD-10-CM   1. 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 Essential hypertension  I10     3. Hypertensive retinopathy of both eyes  H35.033     4. Moderate nonproliferative diabetic retinopathy of both eyes without macular edema associated with type 2 diabetes mellitus (HCC)  Z61.0960     5. Long term (current) use of oral hypoglycemic drugs  Z79.84     6. Secondary open-angle glaucoma  H40.50X0     7. Primary open angle glaucoma (POAG) of right eye, mild stage  H40.1111     8. Pseudophakia of both eyes  Z96.1      1. History of sarcoid uveitis, left eye  - per pt report, onset ~late 1990s  - history of lung biopsy proven sarcoid in 1998 -- but pt has never been managed or treated systemically  - uveitis diagnosed and managed by Dr. Luciana Axe  - has been inactive for quite some time  - currently on predForte 1 drop, every other day, for maintenance  - pt reports recurrence of inflammation when she last  tried to stop PredForte  - no active inflammation on exam or OCT today  - FA 12.15.23 without inflammation or leakage  - repeat FA 07.17.24 showed OD: Scattered peripheral MA -- mild, No leakage, no NV, OS: Severe vascular perfusion defect 360 periphery extending posteriorly to arcades. No leakage or vasculitis OU  - continue PredForte 1 drop every other day OS for maintenance  - f/u 6-9 mos, sooner prn - DFE/OCT  - pt had appt with Dr. Fara Boros (pulmonologist) on September 18th, but he did not recommend steroids at that time due to  her age  35,3. Hypertensive retinopathy OU - discussed importance of tight BP control - monitor  4,5. Mild nonproliferative diabetic retinopathy w/o DME, both eyes - The incidence, risk factors for progression, natural history and treatment options for diabetic retinopathy were discussed with patient.   - The need for close monitoring of blood glucose, blood pressure, and serum lipids, avoiding cigarette or any type of tobacco, and the need for long term follow up was also discussed with patient. - exam with rare MA OU  - FA (12.12.23) shows OD: Scattered peripheral MA -- mild, No leakage, no NV; OS: No leakage -- no active inflammation or vasculitis - repeat FA 07.17.24 showed OD: Scattered peripheral MA -- mild, No leakage, no NV, OS: Severe vascular perfusion defect 360 periphery extending posteriorly to arcades. No leakage or vasculitis OU - OCT without diabetic macular edema, both eyes  - f/u in 6-9 mos -- DFE/OCT  6,7. Secondary open angle glaucoma OS, POAG OD  - under the expert care of Dr. Lottie Dawson  - Pt presented on 7.17.24 acutely for increased blurriness and decreased superior visual field in OS. HVF 24-2 was performed showing severe superior altitudinal defect  - BCVA OS 20/150 from 20/60  - IOP 12,09  - review of notes from Dr. Lottie Dawson indicate presence of superior altitudinal defect on multiple HVFs - Timolol BID OU - Dorzolamide BID OS  - Brimonidine BID OS  - discussed findings and possibility of glaucoma progression driving visual symptoms - recommend f/u with Dr. Lottie Dawson for further eval  8. Pseudophakia OU  - s/p CE/IOL OU  - IOL in good position, doing well  - monitor  Ophthalmic Meds Ordered this visit:  No orders of the defined types were placed in this encounter.    Return for f/u 6-9 months, sarcoid uveitis OS, DFE, OCT.  There are no Patient Instructions on file for this visit.   Explained the diagnoses, plan, and follow up with the patient and they expressed  understanding.  Patient expressed understanding of the importance of proper follow up care.    This document serves as a record of services personally performed by Karie Chimera, MD, PhD. It was created on their behalf by De Blanch, an ophthalmic technician. The creation of this record is the provider's dictation and/or activities during the visit.    Electronically signed by: De Blanch, OA, 04/17/23  12:31 AM  Karie Chimera, M.D., Ph.D. Diseases & Surgery of the Retina and Vitreous [facility]  I have reviewed the above documentation for accuracy and completeness, and I agree with the above. Karie Chimera, M.D., Ph.D. 04/17/23 12:35 AM   Abbreviations: M myopia (nearsighted); A astigmatism; H hyperopia (farsighted); P presbyopia; Mrx spectacle prescription;  CTL contact lenses; OD right eye; OS left eye; OU both eyes  XT exotropia; ET esotropia; PEK punctate epithelial keratitis; PEE punctate epithelial erosions;  DES dry eye syndrome; MGD meibomian gland dysfunction; ATs artificial tears; PFAT's preservative free artificial tears; NSC nuclear sclerotic cataract; PSC posterior subcapsular cataract; ERM epi-retinal membrane; PVD posterior vitreous detachment; RD retinal detachment; DM diabetes mellitus; DR diabetic retinopathy; NPDR non-proliferative diabetic retinopathy; PDR proliferative diabetic retinopathy; CSME clinically significant macular edema; DME diabetic macular edema; dbh dot blot hemorrhages; CWS cotton wool spot; POAG primary open angle glaucoma; C/D cup-to-disc ratio; HVF humphrey visual field; GVF goldmann visual field; OCT optical coherence tomography; IOP intraocular pressure; BRVO Branch retinal vein occlusion; CRVO central retinal vein occlusion; CRAO central retinal artery occlusion; BRAO branch retinal artery occlusion; RT retinal tear; SB scleral buckle; PPV pars plana vitrectomy; VH Vitreous hemorrhage; PRP panretinal laser  photocoagulation; IVK intravitreal kenalog; VMT vitreomacular traction; MH Macular hole;  NVD neovascularization of the disc; NVE neovascularization elsewhere; AREDS age related eye disease study; ARMD age related macular degeneration; POAG primary open angle glaucoma; EBMD epithelial/anterior basement membrane dystrophy; ACIOL anterior chamber intraocular lens; IOL intraocular lens; PCIOL posterior chamber intraocular lens; Phaco/IOL phacoemulsification with intraocular lens placement; PRK photorefractive keratectomy; LASIK laser assisted in situ keratomileusis; HTN hypertension; DM diabetes mellitus; COPD chronic obstructive pulmonary disease

## 2023-04-15 ENCOUNTER — Encounter (INDEPENDENT_AMBULATORY_CARE_PROVIDER_SITE_OTHER): Payer: Self-pay | Admitting: Ophthalmology

## 2023-04-15 ENCOUNTER — Ambulatory Visit (INDEPENDENT_AMBULATORY_CARE_PROVIDER_SITE_OTHER): Payer: Medicare Other | Admitting: Ophthalmology

## 2023-04-15 DIAGNOSIS — Z7984 Long term (current) use of oral hypoglycemic drugs: Secondary | ICD-10-CM

## 2023-04-15 DIAGNOSIS — H35033 Hypertensive retinopathy, bilateral: Secondary | ICD-10-CM | POA: Diagnosis not present

## 2023-04-15 DIAGNOSIS — I10 Essential (primary) hypertension: Secondary | ICD-10-CM

## 2023-04-15 DIAGNOSIS — H4050X0 Glaucoma secondary to other eye disorders, unspecified eye, stage unspecified: Secondary | ICD-10-CM

## 2023-04-15 DIAGNOSIS — E113393 Type 2 diabetes mellitus with moderate nonproliferative diabetic retinopathy without macular edema, bilateral: Secondary | ICD-10-CM

## 2023-04-15 DIAGNOSIS — Z961 Presence of intraocular lens: Secondary | ICD-10-CM

## 2023-04-15 DIAGNOSIS — D8683 Sarcoid iridocyclitis: Secondary | ICD-10-CM | POA: Diagnosis not present

## 2023-04-15 DIAGNOSIS — H401111 Primary open-angle glaucoma, right eye, mild stage: Secondary | ICD-10-CM

## 2023-04-15 LAB — HM DIABETES EYE EXAM

## 2023-04-17 ENCOUNTER — Encounter (INDEPENDENT_AMBULATORY_CARE_PROVIDER_SITE_OTHER): Payer: Self-pay | Admitting: Ophthalmology

## 2023-04-17 DIAGNOSIS — Z1231 Encounter for screening mammogram for malignant neoplasm of breast: Secondary | ICD-10-CM | POA: Diagnosis not present

## 2023-07-28 DIAGNOSIS — E119 Type 2 diabetes mellitus without complications: Secondary | ICD-10-CM | POA: Diagnosis not present

## 2023-08-02 ENCOUNTER — Observation Stay (HOSPITAL_BASED_OUTPATIENT_CLINIC_OR_DEPARTMENT_OTHER)
Admission: EM | Admit: 2023-08-02 | Discharge: 2023-08-04 | Disposition: A | Discharge status: Still In Hospital | Attending: Internal Medicine | Admitting: Internal Medicine

## 2023-08-02 ENCOUNTER — Other Ambulatory Visit: Payer: Self-pay

## 2023-08-02 ENCOUNTER — Emergency Department (HOSPITAL_BASED_OUTPATIENT_CLINIC_OR_DEPARTMENT_OTHER)

## 2023-08-02 ENCOUNTER — Encounter (HOSPITAL_BASED_OUTPATIENT_CLINIC_OR_DEPARTMENT_OTHER): Payer: Self-pay | Admitting: Emergency Medicine

## 2023-08-02 DIAGNOSIS — E1165 Type 2 diabetes mellitus with hyperglycemia: Secondary | ICD-10-CM | POA: Diagnosis not present

## 2023-08-02 DIAGNOSIS — E8721 Acute metabolic acidosis: Secondary | ICD-10-CM | POA: Diagnosis not present

## 2023-08-02 DIAGNOSIS — E875 Hyperkalemia: Secondary | ICD-10-CM | POA: Diagnosis present

## 2023-08-02 DIAGNOSIS — L89116 Pressure-induced deep tissue damage of right upper back: Secondary | ICD-10-CM | POA: Insufficient documentation

## 2023-08-02 DIAGNOSIS — R54 Age-related physical debility: Secondary | ICD-10-CM | POA: Diagnosis present

## 2023-08-02 DIAGNOSIS — R7401 Elevation of levels of liver transaminase levels: Secondary | ICD-10-CM | POA: Diagnosis not present

## 2023-08-02 DIAGNOSIS — E785 Hyperlipidemia, unspecified: Secondary | ICD-10-CM | POA: Insufficient documentation

## 2023-08-02 DIAGNOSIS — K529 Noninfective gastroenteritis and colitis, unspecified: Secondary | ICD-10-CM | POA: Insufficient documentation

## 2023-08-02 DIAGNOSIS — H401123 Primary open-angle glaucoma, left eye, severe stage: Secondary | ICD-10-CM | POA: Diagnosis not present

## 2023-08-02 DIAGNOSIS — D860 Sarcoidosis of lung: Secondary | ICD-10-CM | POA: Diagnosis not present

## 2023-08-02 DIAGNOSIS — R531 Weakness: Secondary | ICD-10-CM | POA: Diagnosis present

## 2023-08-02 DIAGNOSIS — R918 Other nonspecific abnormal finding of lung field: Secondary | ICD-10-CM | POA: Diagnosis present

## 2023-08-02 DIAGNOSIS — E872 Acidosis, unspecified: Secondary | ICD-10-CM | POA: Diagnosis not present

## 2023-08-02 DIAGNOSIS — E8722 Chronic metabolic acidosis: Secondary | ICD-10-CM | POA: Diagnosis present

## 2023-08-02 DIAGNOSIS — N132 Hydronephrosis with renal and ureteral calculous obstruction: Secondary | ICD-10-CM | POA: Diagnosis not present

## 2023-08-02 DIAGNOSIS — Z79899 Other long term (current) drug therapy: Secondary | ICD-10-CM

## 2023-08-02 DIAGNOSIS — H401111 Primary open-angle glaucoma, right eye, mild stage: Secondary | ICD-10-CM | POA: Diagnosis not present

## 2023-08-02 DIAGNOSIS — L89152 Pressure ulcer of sacral region, stage 2: Secondary | ICD-10-CM | POA: Diagnosis not present

## 2023-08-02 DIAGNOSIS — H209 Unspecified iridocyclitis: Secondary | ICD-10-CM | POA: Diagnosis present

## 2023-08-02 DIAGNOSIS — R599 Enlarged lymph nodes, unspecified: Secondary | ICD-10-CM | POA: Diagnosis present

## 2023-08-02 DIAGNOSIS — E119 Type 2 diabetes mellitus without complications: Secondary | ICD-10-CM

## 2023-08-02 DIAGNOSIS — R7989 Other specified abnormal findings of blood chemistry: Secondary | ICD-10-CM | POA: Diagnosis present

## 2023-08-02 DIAGNOSIS — Z1152 Encounter for screening for COVID-19: Secondary | ICD-10-CM

## 2023-08-02 DIAGNOSIS — E860 Dehydration: Secondary | ICD-10-CM | POA: Diagnosis not present

## 2023-08-02 DIAGNOSIS — E440 Moderate protein-calorie malnutrition: Secondary | ICD-10-CM | POA: Diagnosis not present

## 2023-08-02 DIAGNOSIS — J470 Bronchiectasis with acute lower respiratory infection: Secondary | ICD-10-CM | POA: Diagnosis present

## 2023-08-02 DIAGNOSIS — Z862 Personal history of diseases of the blood and blood-forming organs and certain disorders involving the immune mechanism: Secondary | ICD-10-CM | POA: Diagnosis not present

## 2023-08-02 DIAGNOSIS — L8990 Pressure ulcer of unspecified site, unspecified stage: Secondary | ICD-10-CM | POA: Diagnosis present

## 2023-08-02 DIAGNOSIS — Z8669 Personal history of other diseases of the nervous system and sense organs: Secondary | ICD-10-CM | POA: Insufficient documentation

## 2023-08-02 DIAGNOSIS — Z85828 Personal history of other malignant neoplasm of skin: Secondary | ICD-10-CM | POA: Diagnosis not present

## 2023-08-02 DIAGNOSIS — F17210 Nicotine dependence, cigarettes, uncomplicated: Secondary | ICD-10-CM | POA: Diagnosis not present

## 2023-08-02 DIAGNOSIS — I129 Hypertensive chronic kidney disease with stage 1 through stage 4 chronic kidney disease, or unspecified chronic kidney disease: Secondary | ICD-10-CM | POA: Diagnosis not present

## 2023-08-02 DIAGNOSIS — J984 Other disorders of lung: Secondary | ICD-10-CM | POA: Diagnosis not present

## 2023-08-02 DIAGNOSIS — I719 Aortic aneurysm of unspecified site, without rupture: Secondary | ICD-10-CM | POA: Diagnosis present

## 2023-08-02 DIAGNOSIS — I700 Atherosclerosis of aorta: Secondary | ICD-10-CM | POA: Diagnosis present

## 2023-08-02 DIAGNOSIS — D869 Sarcoidosis, unspecified: Secondary | ICD-10-CM | POA: Insufficient documentation

## 2023-08-02 DIAGNOSIS — I10 Essential (primary) hypertension: Secondary | ICD-10-CM | POA: Insufficient documentation

## 2023-08-02 DIAGNOSIS — J189 Pneumonia, unspecified organism: Secondary | ICD-10-CM | POA: Diagnosis not present

## 2023-08-02 DIAGNOSIS — Z87442 Personal history of urinary calculi: Secondary | ICD-10-CM

## 2023-08-02 DIAGNOSIS — R109 Unspecified abdominal pain: Secondary | ICD-10-CM | POA: Diagnosis not present

## 2023-08-02 DIAGNOSIS — Z888 Allergy status to other drugs, medicaments and biological substances status: Secondary | ICD-10-CM

## 2023-08-02 DIAGNOSIS — E1122 Type 2 diabetes mellitus with diabetic chronic kidney disease: Secondary | ICD-10-CM | POA: Diagnosis not present

## 2023-08-02 DIAGNOSIS — Z681 Body mass index (BMI) 19 or less, adult: Secondary | ICD-10-CM | POA: Diagnosis not present

## 2023-08-02 DIAGNOSIS — Z7984 Long term (current) use of oral hypoglycemic drugs: Secondary | ICD-10-CM

## 2023-08-02 DIAGNOSIS — N183 Chronic kidney disease, stage 3 unspecified: Secondary | ICD-10-CM | POA: Diagnosis not present

## 2023-08-02 DIAGNOSIS — I7121 Aneurysm of the ascending aorta, without rupture: Secondary | ICD-10-CM | POA: Diagnosis not present

## 2023-08-02 DIAGNOSIS — N179 Acute kidney failure, unspecified: Principal | ICD-10-CM | POA: Diagnosis present

## 2023-08-02 DIAGNOSIS — H401133 Primary open-angle glaucoma, bilateral, severe stage: Secondary | ICD-10-CM | POA: Diagnosis present

## 2023-08-02 LAB — GLUCOSE, CAPILLARY
Glucose-Capillary: 131 mg/dL — ABNORMAL HIGH (ref 70–99)
Glucose-Capillary: 139 mg/dL — ABNORMAL HIGH (ref 70–99)

## 2023-08-02 LAB — URINALYSIS, ROUTINE W REFLEX MICROSCOPIC
Bilirubin Urine: NEGATIVE
Glucose, UA: 250 mg/dL — AB
Hgb urine dipstick: NEGATIVE
Leukocytes,Ua: NEGATIVE
Nitrite: NEGATIVE
Specific Gravity, Urine: 1.017 (ref 1.005–1.030)
pH: 6 (ref 5.0–8.0)

## 2023-08-02 LAB — CBC
HCT: 40.4 % (ref 36.0–46.0)
Hemoglobin: 12.8 g/dL (ref 12.0–15.0)
MCH: 30 pg (ref 26.0–34.0)
MCHC: 31.7 g/dL (ref 30.0–36.0)
MCV: 94.8 fL (ref 80.0–100.0)
Platelets: 261 10*3/uL (ref 150–400)
RBC: 4.26 MIL/uL (ref 3.87–5.11)
RDW: 18.4 % — ABNORMAL HIGH (ref 11.5–15.5)
WBC: 13.1 10*3/uL — ABNORMAL HIGH (ref 4.0–10.5)
nRBC: 0 % (ref 0.0–0.2)

## 2023-08-02 LAB — I-STAT VENOUS BLOOD GAS, ED
Acid-base deficit: 12 mmol/L — ABNORMAL HIGH (ref 0.0–2.0)
Bicarbonate: 13.9 mmol/L — ABNORMAL LOW (ref 20.0–28.0)
Calcium, Ion: 1.24 mmol/L (ref 1.15–1.40)
HCT: 38 % (ref 36.0–46.0)
Hemoglobin: 12.9 g/dL (ref 12.0–15.0)
O2 Saturation: 44 %
Potassium: 4.5 mmol/L (ref 3.5–5.1)
Sodium: 142 mmol/L (ref 135–145)
TCO2: 15 mmol/L — ABNORMAL LOW (ref 22–32)
pCO2, Ven: 32.3 mmHg — ABNORMAL LOW (ref 44–60)
pH, Ven: 7.242 — ABNORMAL LOW (ref 7.25–7.43)
pO2, Ven: 28 mmHg — CL (ref 32–45)

## 2023-08-02 LAB — BASIC METABOLIC PANEL WITH GFR
Anion gap: 11 (ref 5–15)
Anion gap: 12 (ref 5–15)
BUN: 106 mg/dL — ABNORMAL HIGH (ref 8–23)
BUN: 75 mg/dL — ABNORMAL HIGH (ref 8–23)
CO2: 15 mmol/L — ABNORMAL LOW (ref 22–32)
CO2: 13 mmol/L — ABNORMAL LOW (ref 22–32)
Calcium: 8.5 mg/dL — ABNORMAL LOW (ref 8.9–10.3)
Calcium: 8.1 mg/dL — ABNORMAL LOW (ref 8.9–10.3)
Chloride: 109 mmol/L (ref 98–111)
Chloride: 115 mmol/L — ABNORMAL HIGH (ref 98–111)
Creatinine, Ser: 2.16 mg/dL — ABNORMAL HIGH (ref 0.44–1.00)
Creatinine, Ser: 1.25 mg/dL — ABNORMAL HIGH (ref 0.44–1.00)
GFR, Estimated: 23 mL/min — ABNORMAL LOW (ref >60)
GFR, Estimated: 44 mL/min — ABNORMAL LOW (ref >60)
Glucose, Bld: 266 mg/dL — ABNORMAL HIGH (ref 70–99)
Glucose, Bld: 174 mg/dL — ABNORMAL HIGH (ref 70–99)
Potassium: 5.1 mmol/L (ref 3.5–5.1)
Potassium: 4.1 mmol/L (ref 3.5–5.1)
Sodium: 135 mmol/L (ref 135–145)
Sodium: 140 mmol/L (ref 135–145)

## 2023-08-02 LAB — RESP PANEL BY RT-PCR (RSV, FLU A&B, COVID)  RVPGX2
Influenza A by PCR: NEGATIVE
Influenza B by PCR: NEGATIVE
Resp Syncytial Virus by PCR: NEGATIVE
SARS Coronavirus 2 by RT PCR: NEGATIVE

## 2023-08-02 LAB — LACTIC ACID, PLASMA: Lactic Acid, Venous: 1.3 mmol/L (ref 0.5–1.9)

## 2023-08-02 LAB — CBG MONITORING, ED: Glucose-Capillary: 252 mg/dL — ABNORMAL HIGH (ref 70–99)

## 2023-08-02 LAB — PROCALCITONIN: Procalcitonin: 0.48 ng/mL

## 2023-08-02 LAB — STREP PNEUMONIAE URINARY ANTIGEN: Strep Pneumo Urinary Antigen: NEGATIVE

## 2023-08-02 MED ORDER — DOXYLAMINE SUCCINATE (SLEEP) 25 MG PO TABS
25.0000 mg | ORAL_TABLET | Freq: Every day | ORAL | Status: DC
  Administered 2023-08-02 – 2023-08-03 (×2): 25 mg via ORAL
  Filled 2023-08-02 (×2): qty 1

## 2023-08-02 MED ORDER — ONDANSETRON HCL 4 MG PO TABS: 4.0000 mg | ORAL_TABLET | Freq: Four times a day (QID) | ORAL | Status: DC | PRN

## 2023-08-02 MED ORDER — EZETIMIBE-SIMVASTATIN 10-40 MG PO TABS
1.0000 | ORAL_TABLET | Freq: Every day | ORAL | Status: DC
Start: 2023-08-03 — End: 2023-08-02

## 2023-08-02 MED ORDER — NIACIN ER (ANTIHYPERLIPIDEMIC) 500 MG PO TBCR
1000.0000 mg | EXTENDED_RELEASE_TABLET | Freq: Every day | ORAL | Status: DC
  Administered 2023-08-03 – 2023-08-04 (×2): 1000 mg via ORAL
  Filled 2023-08-02 (×2): qty 2

## 2023-08-02 MED ORDER — LACTATED RINGERS IV SOLN: INTRAVENOUS | Status: DC

## 2023-08-02 MED ORDER — ONDANSETRON HCL 4 MG/2ML IJ SOLN
4.0000 mg | Freq: Four times a day (QID) | INTRAMUSCULAR | Status: DC | PRN
  Administered 2023-08-02: 4 mg via INTRAVENOUS
  Filled 2023-08-02: qty 2

## 2023-08-02 MED ORDER — IPRATROPIUM-ALBUTEROL 0.5-2.5 (3) MG/3ML IN SOLN: 3.0000 mL | RESPIRATORY_TRACT | Status: DC | PRN

## 2023-08-02 MED ORDER — POLYETHYL GLYC-PROPYL GLYC PF 0.4-0.3 % OP SOLN: 4.0000 [drp] | Freq: Every evening | OPHTHALMIC | Status: DC

## 2023-08-02 MED ORDER — BRIMONIDINE TARTRATE 0.2 % OP SOLN
1.0000 [drp] | Freq: Two times a day (BID) | OPHTHALMIC | Status: DC
  Administered 2023-08-02 – 2023-08-04 (×4): 1 [drp] via OPHTHALMIC
  Filled 2023-08-02: qty 5

## 2023-08-02 MED ORDER — METOPROLOL SUCCINATE ER 50 MG PO TB24
50.0000 mg | ORAL_TABLET | Freq: Every day | ORAL | Status: DC
  Administered 2023-08-03 – 2023-08-04 (×2): 50 mg via ORAL
  Filled 2023-08-02 (×2): qty 1

## 2023-08-02 MED ORDER — TIMOLOL HEMIHYDRATE 0.5 % OP SOLN: 1.0000 [drp] | Freq: Two times a day (BID) | OPHTHALMIC | Status: DC

## 2023-08-02 MED ORDER — CITALOPRAM HYDROBROMIDE 20 MG PO TABS
20.0000 mg | ORAL_TABLET | Freq: Every day | ORAL | Status: DC
  Administered 2023-08-03 – 2023-08-04 (×2): 20 mg via ORAL
  Filled 2023-08-02 (×2): qty 1

## 2023-08-02 MED ORDER — SODIUM CHLORIDE 0.9 % IV SOLN
500.0000 mg | INTRAVENOUS | Status: DC
  Administered 2023-08-03 – 2023-08-04 (×2): 500 mg via INTRAVENOUS
  Filled 2023-08-02 (×2): qty 5

## 2023-08-02 MED ORDER — HEPARIN SODIUM (PORCINE) 5000 UNIT/ML IJ SOLN
5000.0000 [IU] | Freq: Three times a day (TID) | INTRAMUSCULAR | Status: DC
  Administered 2023-08-02 – 2023-08-04 (×5): 5000 [IU] via SUBCUTANEOUS
  Filled 2023-08-02 (×4): qty 1

## 2023-08-02 MED ORDER — SODIUM CHLORIDE 0.9 % IV SOLN
500.0000 mg | Freq: Once | INTRAVENOUS | Status: AC
  Administered 2023-08-02: 500 mg via INTRAVENOUS
  Filled 2023-08-02: qty 5

## 2023-08-02 MED ORDER — INSULIN ASPART 100 UNIT/ML IJ SOLN
0.0000 [IU] | Freq: Three times a day (TID) | INTRAMUSCULAR | Status: DC
  Administered 2023-08-02: 1 [IU] via SUBCUTANEOUS
  Administered 2023-08-03 (×2): 3 [IU] via SUBCUTANEOUS
  Administered 2023-08-03: 1 [IU] via SUBCUTANEOUS
  Administered 2023-08-04 (×2): 2 [IU] via SUBCUTANEOUS

## 2023-08-02 MED ORDER — ONDANSETRON HCL 4 MG/2ML IJ SOLN
4.0000 mg | Freq: Once | INTRAMUSCULAR | Status: DC
  Filled 2023-08-02: qty 2

## 2023-08-02 MED ORDER — ZINC OXIDE 40 % EX OINT
TOPICAL_OINTMENT | Freq: Two times a day (BID) | CUTANEOUS | Status: DC
  Filled 2023-08-02: qty 57

## 2023-08-02 MED ORDER — SODIUM CHLORIDE 0.9 % IV SOLN
1.0000 g | Freq: Once | INTRAVENOUS | Status: AC
  Administered 2023-08-02: 1 g via INTRAVENOUS
  Filled 2023-08-02: qty 10

## 2023-08-02 MED ORDER — SODIUM CHLORIDE 0.9 % IV BOLUS
1000.0000 mL | Freq: Once | INTRAVENOUS | Status: AC
  Administered 2023-08-02: 1000 mL via INTRAVENOUS

## 2023-08-02 MED ORDER — DORZOLAMIDE HCL 2 % OP SOLN
1.0000 [drp] | Freq: Two times a day (BID) | OPHTHALMIC | Status: DC
  Administered 2023-08-02 – 2023-08-04 (×4): 1 [drp] via OPHTHALMIC
  Filled 2023-08-02: qty 10

## 2023-08-02 MED ORDER — SODIUM CHLORIDE 0.9 % IV SOLN
1.0000 g | INTRAVENOUS | Status: DC
  Administered 2023-08-03 – 2023-08-04 (×2): 1 g via INTRAVENOUS
  Filled 2023-08-02 (×2): qty 10

## 2023-08-02 MED ORDER — ACETAMINOPHEN 325 MG PO TABS
650.0000 mg | ORAL_TABLET | Freq: Four times a day (QID) | ORAL | Status: DC | PRN
  Administered 2023-08-03 (×2): 650 mg via ORAL
  Filled 2023-08-02 (×2): qty 2

## 2023-08-02 MED ORDER — EZETIMIBE 10 MG PO TABS
10.0000 mg | ORAL_TABLET | Freq: Every day | ORAL | Status: DC
  Administered 2023-08-03: 10 mg via ORAL
  Filled 2023-08-02: qty 1

## 2023-08-02 MED ORDER — ACETAMINOPHEN 650 MG RE SUPP: 650.0000 mg | Freq: Four times a day (QID) | RECTAL | Status: DC | PRN

## 2023-08-02 MED ORDER — POLYVINYL ALCOHOL 1.4 % OP SOLN
4.0000 [drp] | Freq: Every day | OPHTHALMIC | Status: DC
  Administered 2023-08-03: 4 [drp] via OPHTHALMIC
  Filled 2023-08-02: qty 15

## 2023-08-02 MED ORDER — SIMVASTATIN 40 MG PO TABS
40.0000 mg | ORAL_TABLET | Freq: Every day | ORAL | Status: DC
  Administered 2023-08-03: 40 mg via ORAL
  Filled 2023-08-02: qty 1

## 2023-08-02 MED ORDER — TIMOLOL MALEATE 0.5 % OP SOLN
1.0000 [drp] | Freq: Two times a day (BID) | OPHTHALMIC | Status: DC
Start: 2023-08-02 — End: 2023-08-04
  Administered 2023-08-02 – 2023-08-04 (×4): 1 [drp] via OPHTHALMIC
  Filled 2023-08-02: qty 5

## 2023-08-02 MED ORDER — PREDNISOLONE ACETATE 1 % OP SUSP
1.0000 [drp] | OPHTHALMIC | Status: DC
  Administered 2023-08-03: 1 [drp] via OPHTHALMIC
  Filled 2023-08-02: qty 5

## 2023-08-02 NOTE — ED Notes (Signed)
 Sunquest not used... Lab in chart, unable to print sticker.

## 2023-08-02 NOTE — ED Notes (Signed)
 Purple man green.

## 2023-08-02 NOTE — H&P (Signed)
 History and Physical    Patient: Sabrina Eaton:[id number] DOB: 23-Nov-1940 DOA: 08/02/2023 DOS: the patient was seen and examined on 08/02/2023 PCP: Marcelle Overlie, MD  Patient coming from: Home  Chief Complaint:  Chief Complaint  Patient presents with   Weakness   HPI: Sabrina Eaton is a 83 y.o. 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 She took 2 tablets of loperamide and the diarrhea has subsided. Her appetite is decreased. No sick contacts or travel history.He denied fever, chills, rhinorrhea, sore throat, wheezing or hemoptysis. No chest pain, palpitations, diaphoresis, PND, orthopnea or pitting edema of the lower extremities. No abdominal pain, nausea, emesis, diarrhea, constipation, melena or hematochezia. No flank pain, dysuria, frequency or hematuria.  No polyuria, polydipsia, polyphagia or blurred vision.   Lab work: Urinalysis showed glucose of 250 mg/dL, trace ketones and trace protein.  CBC showed white count 13.1, hemoglobin 12.8 g/dL and platelets 332.  Coronavirus, influenza and RSV PCR was negative.  Normal lactic acid.  Venous blood gas showed a pH of 7.24, pCO2 of 32.3 and PO2 of 28 mmHg.  Bicarbonate was 14, acid-base deficit 12.0 and T CO2 15 mmol/L.  BMP showed sodium 135, potassium 5.1, chloride 109 and CO2 15 mmol/L with an anion gap of 11.  Glucose 266, BUN 106, creatinine 2.16 and calcium 8.5 mg/dL.  Imaging: CT abdomen/pelvis without contrast showing bilateral renal calculi and mild right-sided hydronephrosis, but no obvious hydroureter or obstructing renal ureteral calculi.  No bowel obstructive process or  obvious inflammatory changes.  Normal appendix.  Irregular masslike densities at the right lung base are an anterior Dermanet finding.  This could be scar tissue, infiltrate, atelectasis or neoplastic process.  Follow-up with CT chest with contrast in the future.  Aortic atherosclerosis.   ED course: Initial vital signs were temperature 98.1 F, pulse 87, respiration 16, BP 183/73 mmHg O2 sat 98% on room air.  The patient received 2000 mL normal saline bolus, azithromycin 500 mg IVPB and ceftriaxone 1 g IV.  Review of Systems: As mentioned in the history of present illness. All other systems reviewed and are negative.  Past Medical History:  Diagnosis Date   Arthritis    Asthma due to seasonal allergies    Cancer (HCC)    skin cancer - face - basal   DM (diabetes mellitus) (HCC)    Environmental allergies    History of kidney stones    1 small in each kidney - has had for years   Hypertension    Right clavicle fracture    Sarcoidosis    effecting the eyes   Past Surgical History:  Procedure Laterality Date   BRONCHOSCOPY      x 2 to determine if she has scarcoidosis   COLONOSCOPY W/ POLYPECTOMY     ORIF CLAVICULAR FRACTURE Right 07/12/2019   Procedure: OPEN REDUCTION INTERNAL FIXATION (ORIF) CLAVICULAR FRACTURE;  Surgeon: Yolonda Kida, MD;  Location: [facility] OR;  Service: Orthopedics;  Laterality: Right;   Social History:  reports that she has been smoking cigarettes. She has never used smokeless tobacco. She reports that she does not currently use alcohol. She reports that she does not currently use drugs.  Allergies  Allergen  Reactions   Dorzolamide Itching    History reviewed. No pertinent family history.  Prior to Admission medications   Medication Sig Start Date End Date Taking? Authorizing Provider  acetaminophen (TYLENOL) 500 MG tablet Take 500 mg by mouth every 6 (six) hours as needed for mild pain.    [provider]  brimonidine (ALPHAGAN) 0.2 %  ophthalmic solution Place 1 drop into the left eye in the morning and at bedtime.    [provider]  citalopram (CELEXA) 20 MG tablet Take 20 mg by mouth daily.    [provider]  dorzolamide (TRUSOPT) 2 % ophthalmic solution Place 1 drop into the left eye 2 (two) times daily.    [provider]  doxylamine, Sleep, (UNISOM) 25 MG tablet Take 25 mg by mouth at bedtime.    [provider]  ezetimibe-simvastatin (VYTORIN) 10-40 MG tablet Take 1 tablet by mouth daily.    [provider]  HYDROcodone-acetaminophen (NORCO) 5-325 MG tablet Take 1-2 tablets by mouth every 6 (six) hours as needed. 06/29/19   Geoffery Lyons, MD  metFORMIN (GLUCOPHAGE) 500 MG tablet Take 500 mg by mouth in the morning, at noon, and at bedtime.    [provider]  metoprolol tartrate (LOPRESSOR) 50 MG tablet Take 50 mg by mouth daily.    [provider]  Multiple Vitamins-Minerals (CENTRUM VITAMINTS PO) Take 1 tablet by mouth daily.    [provider]  niacin (NIASPAN) 1000 MG CR tablet Take 1,000 mg by mouth daily.    [provider]  Omega-3 Fatty Acids (FISH OIL) 1000 MG CAPS Take 1 capsule by mouth in the morning, at noon, and at bedtime.    [provider]  ondansetron (ZOFRAN ODT) 4 MG disintegrating tablet Take 1 tablet (4 mg total) by mouth every 8 (eight) hours as needed. 07/12/19   Yolonda Kida, MD  Polyethyl Glycol-Propyl Glycol (SYSTANE HYDRATION PF OP) Systane (PF)    [provider]  prednisoLONE acetate (PRED FORTE) 1 % ophthalmic suspension INSTILL 1 DROP IN THE LEFT EYE EVERY OTHER DAY 08/25/22   Rennis Chris, MD  timolol (BETIMOL) 0.5 % ophthalmic solution Place 1 drop into both eyes 2 (two) times daily.    [provider]    Physical Exam: Vitals:   08/02/23 1030 08/02/23 1200 08/02/23 1230 08/02/23 1422  BP: (!) 158/65 (!) 168/71 (!) 175/73 (!) 159/78  Pulse: 86 89 92 (!) 104  Resp: 16 17  (!) 30 16  Temp:    98.1 F (36.7 C)  TempSrc:    Oral  SpO2: 97% 98% 98% 100%  Weight:      Height:       Physical Exam Vitals and nursing note reviewed.  Constitutional:      General: She is awake. She is not in acute distress.    Appearance: Normal appearance. She is ill-appearing.  HENT:     Head: Normocephalic.     Nose: No rhinorrhea.     Mouth/Throat:     Mouth: Mucous membranes are dry.  Eyes:     General: No scleral icterus.    Pupils: Pupils are equal, round, and reactive to light.  Neck:     Vascular: No JVD.  Cardiovascular:     Rate and Rhythm: Normal rate and regular rhythm.     Heart sounds: S1 normal and S2 normal.  Pulmonary:     Effort: Pulmonary effort is normal.     Breath sounds: Normal  breath sounds. No wheezing, rhonchi or rales.  Abdominal:     General: Bowel sounds are normal. There is no distension.     Palpations: Abdomen is soft.     Tenderness: There is no abdominal tenderness. There is no guarding.  Musculoskeletal:     Cervical back: Neck supple.     Right lower leg: No edema.     Left lower leg: No edema.  Skin:    General: Skin is warm and dry.  Neurological:     General: No focal deficit present.     Mental Status: She is alert and oriented to person, place, and time.  Psychiatric:        Mood and Affect: Mood normal.        Behavior: Behavior normal. Behavior is cooperative.     Data Reviewed:  Results are pending, will review when available.  EKG: Vent. rate 90 BPM PR interval 112 ms QRS duration 83 ms QT/QTcB 354/434 ms P-R-T axes 67 45 77 Sinus rhythm Borderline short PR interval Minimal ST depression, lateral leads  Assessment and Plan: Principal Problem:   AKI (acute kidney injury) (HCC) Secondary to GI losses from:   Acute gastroenteritis Observation/telemetry. Continue IV fluids. Hold ARB/ACE. Hold metformin. Avoid hypotension. Avoid nephrotoxins. Monitor intake and output. Monitor renal function  electrolytes.  Active Problems:   Primary open angle glaucoma of left eye, severe stage And:   Primary open angle glaucoma of right eye, mild stage Continue brimonidine, dorzolamide and prednisolone drops. Follow-up with ophthalmology as an outpatient.    Sarcoidosis In the setting of history of:   Pulmonary infiltrate  As needed supplemental oxygen. As needed bronchodilators. Continue ceftriaxone 1 g IVPB daily. Continue azithromycin 500 mg IVPB daily. Check strep pneumoniae urinary antigen. Check sputum Gram stain, culture and sensitivity. Follow-up blood culture and sensitivity. Pulmonology will see in AM.    Type 2 diabetes mellitus (HCC) Carbohydrate modified diet. CBG monitoring with RI SS. Check hemoglobin A1c. Holding metformin due to AKI.    Pressure injury of skin Continue local care and prevention measures.    Essential hypertension Continue metoprolol 50 mg p.o. daily. Will switch from tartrate to succinate form.    Hyperlipidemia  Continue Vytorin 10-40 mg p.o. daily.    Advance Care Planning:   Code Status: Full Code   Consults:   Family Communication:   Severity of Illness: The appropriate patient status for this patient is INPATIENT. Inpatient status is judged to be reasonable and necessary in order to provide the required intensity of service to ensure the patient's safety. The patient's presenting symptoms, physical exam findings, and initial radiographic and laboratory data in the context of their chronic comorbidities is felt to place them at high risk for further clinical deterioration. Furthermore, it is not anticipated that the patient will be medically stable for discharge from the hospital within 2 midnights of admission.   * I certify that at the point of admission it is my clinical judgment that the patient will require inpatient hospital care spanning beyond 2 midnights from the point of admission due to high intensity of service, high risk  for further deterioration and high frequency of surveillance required.*  Author: Bobette Mo, MD 08/02/2023 2:29 PM  For on call review [email or web address].   This document was prepared using Dragon voice recognition software and may contain some unintended transcription errors.

## 2023-08-02 NOTE — Consult Note (Signed)
 WOC Nurse Consult Note: Reason for Consult:coccyx wound  Wound type:Stage 2 Pressure Injury coccyx  Pressure Injury POA: Yes Measurement: see nursing flowsheet  Wound bed: pink moist  Drainage (amount, consistency, odor) minimal serosanguinous  Periwound: mild erythema  Dressing procedure/placement/frequency: Cleanse coccyx wound with NS, apply Xeroform gauze [place] [phone]) to wound bed daily.  Cover surrounding skin of buttocks with a thin layer of Desitin.  Cover with ABD pad and tape or silicone foam whichever is preferred.   POC discussed with bedside nurse. WOC nurse will not follow.  Re-consult if further needs arise.   Thank you,    Priscella Mann MSN, RN-BC, [employer] [phone]

## 2023-08-02 NOTE — ED Notes (Signed)
 Called [facility] for transport, pt bed assignment is ready

## 2023-08-02 NOTE — ED Notes (Addendum)
 Unable to collect the cultures or start the antibiotics... Other critical Pts. [facility] report given.Marland KitchenMarland Kitchen

## 2023-08-02 NOTE — ED Notes (Signed)
 Istat obtained from RRT.

## 2023-08-02 NOTE — Progress Notes (Signed)
 Plan of Care Note for accepted transfer   Patient: Sabrina Eaton MRN: [id number]   DOA: 08/02/2023  Facility requesting transfer: [facility]. Requesting Provider: Jacalyn Lefevre, MD. Reason for transfer: AKI, pulmonary infiltrates. Facility course:  83 yo female with pmhx significant for htn, kidney stones, skin cancer, sarcoidosis, and dm.  Pt has had n/v/d for the past week.  N/v/d has improved, but she feels very weak and has not had an appetite.  Pt has not had a fever. Imaging showing infiltrates, which can be from PNA vs sarcoidosis. Started on CAP coverage by ED. She is supposed to have an appointment with [facility] pulmonology later this month, but might benefit from a consult while in the hospital.  Plan of care: The patient is accepted for admission to Med-surg  unit, at [facility].  Author: Bobette Mo, MD 08/02/2023  Check [email or web address] for on-call coverage.  Nursing staff, Please call [facility] Admits & Consults System-Wide number on Amion as soon as patient's arrival, so appropriate admitting provider can evaluate the pt.

## 2023-08-02 NOTE — ED Provider Notes (Signed)
 [place] EMERGENCY DEPARTMENT AT [facility] Provider Note   CSN: [id number] Arrival date & time: 08/02/23  1004     History {Add pertinent medical, surgical, social history, OB history to HPI:1} Chief Complaint  Patient presents with   Weakness    Sabrina Eaton is a 83 y.o. female.  Pt is a 83 yo female with pmhx significant for htn, kidney stones, skin cancer, sarcoidosis, and dm.  Pt has had n/v/d for the past week.  N/v/d has improved, but she feels very weak and has not had an appetite.  Pt has not had a fever.        Home Medications Prior to Admission medications   Medication Sig Start Date End Date Taking? Authorizing Provider  acetaminophen (TYLENOL) 500 MG tablet Take 500 mg by mouth every 6 (six) hours as needed for mild pain.    [provider]  brimonidine (ALPHAGAN) 0.2 % ophthalmic solution Place 1 drop into the left eye in the morning and at bedtime.    [provider]  citalopram (CELEXA) 20 MG tablet Take 20 mg by mouth daily.    [provider]  dorzolamide (TRUSOPT) 2 % ophthalmic solution Place 1 drop into the left eye 2 (two) times daily.    [provider]  doxylamine, Sleep, (UNISOM) 25 MG tablet Take 25 mg by mouth at bedtime.    [provider]  ezetimibe-simvastatin (VYTORIN) 10-40 MG tablet Take 1 tablet by mouth daily.    [provider]  HYDROcodone-acetaminophen (NORCO) 5-325 MG tablet Take 1-2 tablets by mouth every 6 (six) hours as needed. 06/29/19   Geoffery Lyons, MD  metFORMIN (GLUCOPHAGE) 500 MG tablet Take 500 mg by mouth in the morning, at noon, and at bedtime.    [provider]  metoprolol tartrate (LOPRESSOR) 50 MG tablet Take 50 mg by mouth daily.    [provider]  Multiple Vitamins-Minerals (CENTRUM VITAMINTS PO) Take 1 tablet by mouth daily.    [provider]  niacin (NIASPAN) 1000 MG CR tablet Take 1,000 mg by mouth daily.    [provider]  Omega-3 Fatty Acids (FISH OIL) 1000 MG CAPS Take 1 capsule by mouth in the morning, at noon, and at bedtime.    [provider]  ondansetron (ZOFRAN ODT) 4 MG disintegrating tablet Take 1 tablet (4 mg total) by mouth every 8 (eight) hours as needed. 07/12/19   Yolonda Kida, MD  Polyethyl Glycol-Propyl Glycol (SYSTANE HYDRATION PF OP) Systane (PF)    [provider]  prednisoLONE acetate (PRED FORTE) 1 % ophthalmic suspension INSTILL 1 DROP IN THE LEFT EYE EVERY OTHER DAY 08/25/22   Rennis Chris, MD  timolol (BETIMOL) 0.5 % ophthalmic solution Place 1 drop into both eyes 2 (two) times daily.    [provider]      Allergies    Dorzolamide    Review of Systems   Review of Systems  Constitutional:  Positive for appetite change.  Gastrointestinal:  Positive for diarrhea, nausea and vomiting.  Neurological:  Positive for weakness.  All other systems reviewed and are negative.   Physical Exam Updated Vital Signs BP (!) 168/71   Pulse 89   Temp 98.1 F (36.7 C) (Oral)   Resp 17   Ht 5\' 2"  (1.575 m)   Wt 47 kg   SpO2 98%   BMI 18.95 kg/m  Physical Exam Vitals and nursing note reviewed.  Constitutional:  Appearance: Normal appearance.  HENT:     Head: Normocephalic and atraumatic.     Right Ear: External ear normal.     Left Ear: External ear normal.     Nose: Nose normal.     Mouth/Throat:     Mouth: Mucous membranes are dry.  Eyes:     Extraocular Movements: Extraocular movements intact.     Conjunctiva/sclera: Conjunctivae normal.     Pupils: Pupils are equal, round, and reactive to light.  Cardiovascular:     Rate and Rhythm: Normal rate and regular rhythm.     Pulses: Normal pulses.     Heart sounds: Normal heart sounds.  Pulmonary:     Effort: Pulmonary effort is normal.     Breath sounds: Normal breath sounds.  Abdominal:     General: Abdomen is flat. Bowel sounds are normal.     Palpations: Abdomen is soft.   Musculoskeletal:        General: Normal range of motion.     Cervical back: Normal range of motion and neck supple.  Skin:    General: Skin is warm.     Capillary Refill: Capillary refill takes less than 2 seconds.  Neurological:     General: No focal deficit present.     Mental Status: She is alert and oriented to person, place, and time.  Psychiatric:        Mood and Affect: Mood normal.        Behavior: Behavior normal.     ED Results / Procedures / Treatments   Labs (all labs ordered are listed, but only abnormal results are displayed) Labs Reviewed  BASIC METABOLIC PANEL WITH GFR - Abnormal; Notable for the following components:      Result Value   CO2 15 (*)    Glucose, Bld 266 (*)    BUN 106 (*)    Creatinine, Ser 2.16 (*)    Calcium 8.5 (*)    GFR, Estimated 23 (*)    All other components within normal limits  CBC - Abnormal; Notable for the following components:   WBC 13.1 (*)    RDW 18.4 (*)    All other components within normal limits  URINALYSIS, ROUTINE W REFLEX MICROSCOPIC - Abnormal; Notable for the following components:   Glucose, UA 250 (*)    Ketones, ur TRACE (*)    Protein, ur TRACE (*)    All other components within normal limits  I-STAT VENOUS BLOOD GAS, ED - Abnormal; Notable for the following components:   pH, Ven 7.242 (*)    pCO2, Ven 32.3 (*)    pO2, Ven 28 (*)    Bicarbonate 13.9 (*)    TCO2 15 (*)    Acid-base deficit 12.0 (*)    All other components within normal limits  RESP PANEL BY RT-PCR (RSV, FLU A&B, COVID)  RVPGX2  CULTURE, BLOOD (ROUTINE X 2)  CULTURE, BLOOD (ROUTINE X 2)  LACTIC ACID, PLASMA  LACTIC ACID, PLASMA  CBG MONITORING, ED    EKG EKG Interpretation Date/Time:  Sunday August 02 2023 10:15:47 EDT Ventricular Rate:  90 PR Interval:  112 QRS Duration:  83 QT Interval:  354 QTC Calculation: 434 R Axis:   45  Text Interpretation: Sinus rhythm Borderline short PR interval Minimal ST depression, lateral leads No  significant change since last tracing Confirmed by Jacalyn Lefevre [phone]) on 08/02/2023 10:28:18 AM  Radiology CT Chest Wo Contrast Result Date: 08/02/2023 CLINICAL DATA:  Abnormal x-ray, lung nodule.  EXAM: CT CHEST WITHOUT CONTRAST TECHNIQUE: Multidetector CT imaging of the chest was performed following the standard protocol without IV contrast. RADIATION DOSE REDUCTION: This exam was performed according to the departmental dose-optimization program which includes automated exposure control, adjustment of the mA and/or kV according to patient size and/or use of iterative reconstruction technique. COMPARISON:  07/29/2022. FINDINGS: Cardiovascular: The heart is normal in size and there is a trace pericardial effusion. Multi-vessel coronary artery calcifications are noted. There is atherosclerotic calcification of the aorta with mild dilatation of the ascending aorta measuring 4.0 cm. There is aneurysmal dilatation of the proximal descending aorta measuring 3.8 cm. The pulmonary trunk is normal in caliber. Mediastinum/Nodes: Enlarged lymph node is noted in the precarinal space measuring 1.3 cm, may be reactive. No axillary lymphadenopathy. Evaluation of the hila is limited due to lack of IV contrast. The thyroid gland, trachea, and esophagus are within normal limits. Lungs/Pleura: Mild biapical pleural and parenchymal thickening is noted. Bronchiectasis with bronchial wall thickening is present bilaterally. Scattered ill-defined ground-glass attenuation is noted in the lungs bilaterally. A 3 mm nodule is present in the left lower lobe, axial image 85. Patchy and strandy airspace disease is present in the lower lobes bilaterally. There is a trace left pleural effusion. Upper Abdomen: Renal calculi are noted on the left. No acute abnormality. Musculoskeletal: Degenerative changes are present in the thoracic spine. Fixation hardware is noted in the right clavicle. No acute osseous abnormality is seen. IMPRESSION: 1.  Scattered ground-glass opacities in the lungs bilaterally with patchy airspace disease in the lower lobes, likely infectious or inflammatory. Three-month follow-up is recommended to document resolution. 2. 3 mm left upper lobe nodule. No follow-up needed if patient is low-risk.This recommendation follows the consensus statement: Guidelines for Management of Incidental Pulmonary Nodules Detected on CT Images: From the Fleischner Society 2017; Radiology 2017; 284:228-243. 3. Coronary artery calcifications. 4. 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 Recommend annual imaging followup by CTA or MRA. This recommendation follows 2010 ACCF/AHA/AATS/ACR/ASA/SCA/SCAI/SIR/STS/SVM Guidelines for the Diagnosis and Management of Patients with Thoracic Aortic Disease. Circulation. 2010; 121: W098-J191. Aortic aneurysm NOS (ICD10-I71.9) 5. Left renal calculi. Electronically Signed   By: Thornell Sartorius M.D.   On: 08/02/2023 12:06   CT ABDOMEN PELVIS WO CONTRAST Result Date: 08/02/2023 CLINICAL DATA:  Abdominal pain, diarrhea and weakness. EXAM: CT ABDOMEN AND PELVIS WITHOUT CONTRAST TECHNIQUE: Multidetector CT imaging of the abdomen and pelvis was performed following the standard protocol without IV contrast. RADIATION DOSE REDUCTION: This exam was performed according to the departmental dose-optimization program which includes automated exposure control, adjustment of the mA and/or kV according to patient size and/or use of iterative reconstruction technique. COMPARISON:  None Available. FINDINGS: Limited examination without IV or oral contrast in this patient with very little intraperitoneal fat. Lower chest: Irregular masslike densities at the right lung base are and indeterminate finding. Possible scarring changes., infiltrate, atelectasis or neoplastic process. Left basilar scarring type changes. No pleural effusions. No pericardial effusion. Advanced aortic and coronary  artery calcifications. Hepatobiliary: No obvious hepatic lesions without contrast. No intrahepatic biliary dilatation. The gallbladder is grossly normal. No common bile duct dilatation. Pancreas: No mass, inflammation or ductal dilatation. Spleen: Normal size.  No focal lesions.  Small accessory spleen. Adrenals/Urinary Tract: The adrenal glands are unremarkable. Bilateral renal calculi and mild right-sided hydronephrosis but no obvious hydroureter or obstructing ureteral. The bladder is mildly distended but no bladder calculi or mass. Stomach/Bowel: The stomach, duodenum, small bowel  and colon grossly normal without oral contrast. No findings for on obstructive process or obvious inflammatory changes. The appendix appears normal. Vascular/Lymphatic: Atherosclerotic calcifications involving the aorta and iliac arteries but no aneurysm. No mesenteric or retroperitoneal mass or adenopathy. Reproductive: No significant findings. Other: No significant abdominal or pelvic fluid collections. No abdominal wall hernia or subcutaneous lesions. Musculoskeletal: No significant bony findings. IMPRESSION: 1. Bilateral renal calculi and mild right-sided hydronephrosis but no obvious hydroureter or obstructing ureteral calculi. 2. No bowel obstructive process or obvious inflammatory changes. Normal appendix. 3. Irregular masslike densities at the right lung base are an indeterminate finding. Possible scarring changes, infiltrate, atelectasis or neoplastic process. Recommend follow-up chest CT with contrast. 4. Aortic atherosclerosis. Electronically Signed   By: Rudie Meyer M.D.   On: 08/02/2023 11:36    Procedures Procedures  {Document cardiac monitor, telemetry assessment procedure when appropriate:1}  Medications Ordered in ED Medications  ondansetron (ZOFRAN) injection 4 mg (4 mg Intravenous Patient Refused/Not Given 08/02/23 1052)  cefTRIAXone (ROCEPHIN) 1 g in sodium chloride 0.9 % 100 mL IVPB (has no administration  in time range)  azithromycin (ZITHROMAX) 500 mg in sodium chloride 0.9 % 250 mL IVPB (has no administration in time range)  sodium chloride 0.9 % bolus 1,000 mL (0 mLs Intravenous Stopped 08/02/23 1144)  sodium chloride 0.9 % bolus 1,000 mL (1,000 mLs Intravenous New Bag/Given 08/02/23 1156)    ED Course/ Medical Decision Making/ A&P   {   Click here for ABCD2, HEART and other calculatorsREFRESH Note before signing :1}                              Medical Decision Making Amount and/or Complexity of Data Reviewed Labs: ordered. Radiology: ordered.  Risk Prescription drug management. Decision regarding hospitalization.   This patient presents to the ED for concern of weakness/n/v, this involves an extensive number of treatment options, and is a complaint that carries with it a high risk of complications and morbidity.  The differential diagnosis includes infection, dehydration, electrolyte abn   Co morbidities that complicate the patient evaluation   htn, kidney stones, skin cancer, sarcoidosis, and dm   Additional history obtained:  Additional history obtained from epic chart review External records from outside source obtained and reviewed including son   Lab Tests:  I Ordered, and personally interpreted labs.  The pertinent results include:  cbc with wbc elevated 13.1, bmp with CO2 low at 15, BUN elevated at 106, Cr elevated at 2.16, ua + ketones, covid/flu/rsv neg   Imaging Studies ordered:  I ordered imaging studies including ct chest/abd/pelvis  I independently visualized and interpreted imaging which showed  CT abd/pelvis: Bilateral renal calculi and mild right-sided hydronephrosis but  no obvious hydroureter or obstructing ureteral calculi.  2. No bowel obstructive process or obvious inflammatory changes.  Normal appendix.  3. Irregular masslike densities at the right lung base are an  indeterminate finding. Possible scarring changes, infiltrate,  atelectasis or  neoplastic process. Recommend follow-up chest CT with  contrast.  4. Aortic atherosclerosis.  CT chest: 1. Scattered ground-glass opacities in the lungs bilaterally with  patchy airspace disease in the lower lobes, likely infectious or  inflammatory. Three-month follow-up is recommended to document  resolution.  2. 3 mm left upper lobe nodule. No follow-up needed if patient is  low-risk.This recommendation follows the consensus statement:  Guidelines for Management of Incidental Pulmonary Nodules Detected  on CT Images: From the  Fleischner Society 2017; Radiology 2017;  [phone].  3. Coronary artery calcifications.  4. Aortic atherosclerosis with aneurysmal dilatation of the  ascending aorta measuring 4.0 cm. Recommend annual imaging followup  by CTA or MRA. This recommendation follows 2010  ACCF/AHA/AATS/ACR/ASA/SCA/SCAI/SIR/STS/SVM Guidelines for the  Diagnosis and Management of Patients with Thoracic Aortic Disease.  Circulation. 2010; 121: W098-J191. Aortic aneurysm NOS (ICD10-I71.9)  5. Left renal calculi.   I agree with the radiologist interpretation   Cardiac Monitoring:  The patient was maintained on a cardiac monitor.  I personally viewed and interpreted the cardiac monitored which showed an underlying rhythm of: nsr   Medicines ordered and prescription drug management:  I ordered medication including ivfs/zofran  for sx  Reevaluation of the patient after these medicines showed that the patient improved I have reviewed the patients home medicines and have made adjustments as needed   Test Considered:  ct   Critical Interventions:  ivfs   Consultations Obtained:  I requested consultation with the hospitalist (Dr. Robb Matar),  and discussed lab and imaging findings as well as pertinent plan - he will admit Pt d/w CCM.  They will let Dr. Wynona Neat know and put her on the list to be seen when she gets to [facility].   Problem List / ED Course:  N/v/d with dehydration  and aki:  ivf given Lung abn:  pna vs sarcoid.  Blood cultures drawn.  Abx started   Reevaluation:  After the interventions noted above, I reevaluated the patient and found that they have :improved   Social Determinants of Health:  Lives at home   Dispostion:  After consideration of the diagnostic results and the patients response to treatment, I feel that the patent would benefit from admission.    {Document critical care time when appropriate:1} {Document review of labs and clinical decision tools ie heart score, Chads2Vasc2 etc:1}  {Document your independent review of radiology images, and any outside records:1} {Document your discussion with family members, caretakers, and with consultants:1} {Document social determinants of health affecting pt's care:1} {Document your decision making why or why not admission, treatments were needed:1} Final Clinical Impression(s) / ED Diagnoses Final diagnoses:  AKI (acute kidney injury) (HCC)  Dehydration  Community acquired pneumonia, unspecified laterality    Rx / DC Orders ED Discharge Orders     None

## 2023-08-02 NOTE — Consult Note (Signed)
 NAME:  Sabrina Eaton, MRN:  [id number], DOB:  07/04/1940, LOS: 0 ADMISSION DATE:  08/02/2023, CONSULTATION DATE: 08/02/2023 REFERRING MD: Dr. 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 Marisa Hua at [facility] 02/06/2023-at that time was not having any problems with her breathing CT scan March 2024  Pertinent  Medical History   Past Medical History:  Diagnosis Date   Arthritis    Asthma due to seasonal allergies    Cancer (HCC)    skin cancer - face - basal   DM (diabetes mellitus) (HCC)    Environmental allergies    History of kidney stones    1 small in each kidney - has had for years   Hypertension    Right clavicle fracture    Sarcoidosis    effecting the eyes   Significant Hospital Events: Including procedures, antibiotic start and stop dates in addition to other pertinent events   08/02/2023 CT scan of the chest reviewed-adenopathy, groundglass changes, worse at the bases  Interim History / Subjective:  Weakness fatigue, decreased appetite No abdominal pain or discomfort  Objective   Blood pressure (!) 159/78, pulse (!) 104, temperature 98.1 F (36.7 C), temperature source Oral, resp. rate 16, height 5' (1.524 m), weight 41.5 kg, SpO2 100%.        Intake/Output Summary (Last 24 hours) at 08/02/2023 1450 Last data filed at 08/02/2023 1435 Gross per 24 hour  Intake 1000 ml  Output 280 ml  Net 720 ml   Filed Weights   08/02/23 1013 08/02/23 1432  Weight: 47 kg 41.5 kg    Examination: General: Elderly, does not appear to be  in distress, frail HENT: Dry oral mucosa Lungs: Decreased air entry with rales at the bases Cardiovascular: S1-S2 appreciated Abdomen: Soft, bowel sounds appreciated Extremities: Warm and dry Neuro: Awake alert oriented GU:  Resolved Hospital Problem list     Assessment & Plan:  History of sarcoidosis with recent evaluation with a CT scan in March 2024 only showing mediastinal adenopathy, no lung infiltrate was described on the previous CT - Acute illness in the last week, cough, did have some diarrhea, now with fatigue and decreased appetite  -Current CT with multilobar infiltrate, groundglass changes, some areas of bronchiectatic changes Favor community-acquired pneumonia, may be atypical organisms - Antibiotic therapy with Rocephin and azithromycin  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  -On steroid eyedrops Home Glaucoma -On eyedrops  Acute kidney injury - Likely prerenal - Fluid resuscitation  History of diabetes, hypertension Home  Reformed smoker Quit smoking December 2024  Best Practice (right click and "Reselect all SmartList Selections" daily)   Per primary  Labs   CBC: Recent Labs  Lab 08/02/23 1016 08/02/23 1203  WBC 13.1*  --   HGB 12.8 12.9  HCT 40.4 38.0  MCV 94.8  --   PLT 261  --     Basic Metabolic Panel: Recent Labs  Lab 08/02/23 1016 08/02/23 1203  NA 135 142  K 5.1 4.5  CL 109  --   CO2 15*  --   GLUCOSE  266*  --   BUN 106*  --   CREATININE 2.16*  --   CALCIUM 8.5*  --    GFR: Estimated Creatinine Clearance: 13.6 mL/min (A) (by C-G formula based on SCr of 2.16 mg/dL (H)). Recent Labs  Lab 08/02/23 1016 08/02/23 1149  WBC 13.1*  --   LATICACIDVEN  --  1.3    Liver Function Tests: No results for input(s): "AST", "ALT", "ALKPHOS", "BILITOT", "PROT", "ALBUMIN" in the last 168 hours. No results for input(s): "LIPASE", "AMYLASE" in the last 168 hours. No results for input(s): "AMMONIA" in the last 168 hours.  ABG    Component  Value Date/Time   HCO3 13.9 (L) 08/02/2023 1203   TCO2 15 (L) 08/02/2023 1203   ACIDBASEDEF 12.0 (H) 08/02/2023 1203   O2SAT 44 08/02/2023 1203     Coagulation Profile: No results for input(s): "INR", "PROTIME" in the last 168 hours.  Cardiac Enzymes: No results for input(s): "CKTOTAL", "CKMB", "CKMBINDEX", "TROPONINI" in the last 168 hours.  HbA1C: No results found for: "HGBA1C"  CBG: Recent Labs  Lab 08/02/23 1019  GLUCAP 252*    Review of Systems:   Cough, fatigue, decreased appetite  Past Medical History:  She,  has a past medical history of Arthritis, Asthma due to seasonal allergies, Cancer (HCC), DM (diabetes mellitus) (HCC), Environmental allergies, History of kidney stones, Hypertension, Right clavicle fracture, and Sarcoidosis.   Surgical History:   Past Surgical History:  Procedure Laterality Date   BRONCHOSCOPY      x 2 to determine if she has scarcoidosis   COLONOSCOPY W/ POLYPECTOMY     ORIF CLAVICULAR FRACTURE Right 07/12/2019   Procedure: OPEN REDUCTION INTERNAL FIXATION (ORIF) CLAVICULAR FRACTURE;  Surgeon: Yolonda Kida, MD;  Location: [facility] OR;  Service: Orthopedics;  Laterality: Right;     Social History:   reports that she has been smoking cigarettes. She has never used smokeless tobacco. She reports that she does not currently use alcohol. She reports that she does not currently use drugs.   Family History:  Her family history is not on file.   Allergies Allergies  Allergen Reactions   Dorzolamide Itching     Virl Diamond, MD [place] [facility] Pager: See Loretha Stapler

## 2023-08-02 NOTE — ED Triage Notes (Signed)
 Pt arrived POV c/o decreased appetite, diarrhea and weakness x1 wk. Relief from diarrhea with OTC meds.

## 2023-08-03 ENCOUNTER — Telehealth: Payer: Self-pay | Admitting: Critical Care Medicine

## 2023-08-03 DIAGNOSIS — E8721 Acute metabolic acidosis: Secondary | ICD-10-CM | POA: Diagnosis not present

## 2023-08-03 DIAGNOSIS — R7401 Elevation of levels of liver transaminase levels: Secondary | ICD-10-CM

## 2023-08-03 DIAGNOSIS — E440 Moderate protein-calorie malnutrition: Secondary | ICD-10-CM | POA: Insufficient documentation

## 2023-08-03 DIAGNOSIS — R918 Other nonspecific abnormal finding of lung field: Secondary | ICD-10-CM | POA: Diagnosis not present

## 2023-08-03 DIAGNOSIS — J189 Pneumonia, unspecified organism: Secondary | ICD-10-CM

## 2023-08-03 DIAGNOSIS — E872 Acidosis, unspecified: Secondary | ICD-10-CM

## 2023-08-03 DIAGNOSIS — I129 Hypertensive chronic kidney disease with stage 1 through stage 4 chronic kidney disease, or unspecified chronic kidney disease: Secondary | ICD-10-CM | POA: Diagnosis not present

## 2023-08-03 DIAGNOSIS — Z862 Personal history of diseases of the blood and blood-forming organs and certain disorders involving the immune mechanism: Secondary | ICD-10-CM

## 2023-08-03 DIAGNOSIS — N179 Acute kidney failure, unspecified: Secondary | ICD-10-CM | POA: Diagnosis not present

## 2023-08-03 DIAGNOSIS — N183 Chronic kidney disease, stage 3 unspecified: Secondary | ICD-10-CM | POA: Diagnosis not present

## 2023-08-03 LAB — CBC
HCT: 37.5 % (ref 36.0–46.0)
Hemoglobin: 12.2 g/dL (ref 12.0–15.0)
MCH: 30.9 pg (ref 26.0–34.0)
MCHC: 32.5 g/dL (ref 30.0–36.0)
MCV: 94.9 fL (ref 80.0–100.0)
Platelets: 212 10*3/uL (ref 150–400)
RBC: 3.95 MIL/uL (ref 3.87–5.11)
RDW: 18.4 % — ABNORMAL HIGH (ref 11.5–15.5)
WBC: 18.3 10*3/uL — ABNORMAL HIGH (ref 4.0–10.5)
nRBC: 0 % (ref 0.0–0.2)

## 2023-08-03 LAB — HEMOGLOBIN A1C
Hgb A1c MFr Bld: 6.3 % — ABNORMAL HIGH (ref 4.8–5.6)
Mean Plasma Glucose: 134 mg/dL

## 2023-08-03 LAB — COMPREHENSIVE METABOLIC PANEL WITH GFR
ALT: 51 U/L — ABNORMAL HIGH (ref 0–44)
AST: 44 U/L — ABNORMAL HIGH (ref 15–41)
Albumin: 3 g/dL — ABNORMAL LOW (ref 3.5–5.0)
Alkaline Phosphatase: 117 U/L (ref 38–126)
Anion gap: 12 (ref 5–15)
BUN: 44 mg/dL — ABNORMAL HIGH (ref 8–23)
CO2: 12 mmol/L — ABNORMAL LOW (ref 22–32)
Calcium: 8.5 mg/dL — ABNORMAL LOW (ref 8.9–10.3)
Chloride: 117 mmol/L — ABNORMAL HIGH (ref 98–111)
Creatinine, Ser: 0.93 mg/dL (ref 0.44–1.00)
GFR, Estimated: >60 mL/min (ref >60)
Glucose, Bld: 143 mg/dL — ABNORMAL HIGH (ref 70–99)
Potassium: 4.3 mmol/L (ref 3.5–5.1)
Sodium: 141 mmol/L (ref 135–145)
Total Bilirubin: 0.7 mg/dL (ref 0.0–1.2)
Total Protein: 7 g/dL (ref 6.5–8.1)

## 2023-08-03 LAB — GLUCOSE, CAPILLARY
Glucose-Capillary: 211 mg/dL — ABNORMAL HIGH (ref 70–99)
Glucose-Capillary: 146 mg/dL — ABNORMAL HIGH (ref 70–99)
Glucose-Capillary: 214 mg/dL — ABNORMAL HIGH (ref 70–99)
Glucose-Capillary: 131 mg/dL — ABNORMAL HIGH (ref 70–99)

## 2023-08-03 LAB — PROCALCITONIN: Procalcitonin: 0.23 ng/mL

## 2023-08-03 MED ORDER — ADULT MULTIVITAMIN W/MINERALS CH
1.0000 | ORAL_TABLET | Freq: Every day | ORAL | Status: DC
  Administered 2023-08-04: 1 via ORAL
  Filled 2023-08-03: qty 1

## 2023-08-03 MED ORDER — OMEGA-3-ACID ETHYL ESTERS 1 G PO CAPS
1.0000 g | ORAL_CAPSULE | Freq: Every day | ORAL | Status: DC
  Administered 2023-08-03 – 2023-08-04 (×2): 1 g via ORAL
  Filled 2023-08-03 (×2): qty 1

## 2023-08-03 MED ORDER — SODIUM BICARBONATE 650 MG PO TABS
1300.0000 mg | ORAL_TABLET | Freq: Three times a day (TID) | ORAL | Status: DC
  Administered 2023-08-03 – 2023-08-04 (×4): 1300 mg via ORAL
  Filled 2023-08-03 (×4): qty 2

## 2023-08-03 MED ORDER — ENSURE ENLIVE PO LIQD
237.0000 mL | Freq: Two times a day (BID) | ORAL | Status: DC
  Administered 2023-08-03 – 2023-08-04 (×3): 237 mL via ORAL

## 2023-08-03 NOTE — Progress Notes (Signed)
 Mobility Specialist - Progress Note   08/03/23 0856  Mobility  Activity Ambulated independently in hallway;Transferred to/from [facility]  Level of Assistance Standby assist, set-up cues, supervision of patient - no hands on  Assistive Device None  Distance Ambulated (ft) 200 ft  Range of Motion/Exercises Active  Activity Response Tolerated well  Mobility Referral Yes  Mobility visit 1 Mobility  Mobility Specialist Start Time (ACUTE ONLY) 0845  Mobility Specialist Stop Time (ACUTE ONLY) 0856  Mobility Specialist Time Calculation (min) (ACUTE ONLY) 11 min   Pt was found in bed and agreeable to ambulate. Grew fatigued with session. At EOS returned to bed with all needs met. Call bell in reach.  Billey Chang Mobility Specialist

## 2023-08-03 NOTE — Telephone Encounter (Signed)
 OP Pulmonology follow up has been requested in ~ 1 month.  Steffanie Dunn, DO 08/03/23 10:43 AM [facility]  For contact information, see Amion. If no response to pager, please call PCCM consult pager. After hours, 7PM- 7AM, please call Elink.

## 2023-08-03 NOTE — Hospital Course (Addendum)
 83 y.o. 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 In the ED: Afebrile BP hypertensive, labs with leukocytosis, hyperglycemia metabolic acidosis AKI BUN 106 creatinine 2.1. CT abdomen/pelvis without>> bilateral renal calculi and mild right-sided hydronephrosis, but no obvious hydroureter or obstructing renal ureteral calculi. No bowel obstructive process or obvious inflammatory changes. Normal appendix. Irregular masslike densities at the right lung base are an anterior Dermanet finding. This could be scar tissue, infiltrate, atelectasis or neoplastic process. Follow-up with CT chest with contrast in the future. Aortic atherosclerosis.  Patient admitted for sarcoidosis, gastroenteritis, PCCM consulted. Patient hydrated, electrolytes improved AKI resolved bicarb improved.  At this time she is tolerating diet.  No more nausea or vomiting.  Blood pressure high but med adjusted. Discussed plan of care with patient's son and agreeable for discharge home if BP ok today  Consultation: Pulmonary Nephrology  Subjective: Seen examined this morning Alert awake oriented has no complaint no itching BP running high 160s-200 on room air  Labs stable electrolytes WBC downtrending 15.5  Discharge diagnosis:   AKI NAGMA, acute on chronic, at baseline bicarb 17-20 Hyperkalemia: AKI In the setting of acute gastroenteritis poor oral intake ARB ACE metformin.  AKI has resolved with aggressive IV fluid hydration.  Diarrhea resolved.  Tolerating diet.  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   Recent Labs    08/02/23 1016 08/02/23 1203 08/02/23 1702 08/03/23 0346 08/04/23 0408  BUN 106*  --  75* 44* 20  CREATININE 2.16*  --  1.25* 0.93 0.65  CO2 15*  --  13* 12* 21*  K 5.1 4.5 4.1 4.3 3.7   Sarcoidosis Pulmonary infiltrate-concerning for CAP TTE ceftriaxone azithromycin bronchodilators.  Overall doing well on room air.   Seen by PCCM on discharge will transition to oral antibiotics -DC Rocephin azithromycin as per pulmonary recommendation    Acute gastroenteritis: Resolved.  C. 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 Follow-up with ophthalmology as an outpatient.   History of uveitis -  Cont eyedrops steroid  Type 2 diabetes mellitus with uncontrolled hyperglycemia Continue carb modified diet, SSI Holding metformin due to AKI. Recent Labs  Lab 08/03/23 0346 08/03/23 0731 08/03/23 1150 08/03/23 1635 08/03/23 2056 08/04/23 0733 08/04/23 1203  GLUCAP  --    < > 146* 214* 131* 155* 151*  HGBA1C 6.3*  --   --   --   --   --   --    < > = values in this interval not displayed.    Pressure injury of skin Continue local care and prevention measures.  Essential hypertension BP poorly controlled today, added hydralazine, continue metoprolol 50 mg p.o. daily.  If BP remains stable today will discharge home   Hyperlipidemia  Continue Vytorin 10-40 mg p.o. daily.  Moderate malnutrition: augment diet as below Nutrition Problem: Moderate Malnutrition Etiology: chronic illness Signs/Symptoms: moderate fat depletion, severe muscle depletion Interventions: Refer to RD note for recommendations, Ensure Enlive (each  supplement provides 350kcal and 20 grams of protein), MVI, Liberalize Diet

## 2023-08-03 NOTE — Consult Note (Signed)
 Sabrina Eaton Admit Date: 08/02/2023 08/03/2023 Sabrina Eaton Requesting Physician:  Lanae Boast MD  Reason for Consult:  AKI, Metabolic Acidosis HPI:  54F PMH including long-term sarcoidosis with uveitis, osteoarthritis, DM2, nephrolithiasis, hypertension presented to the ED and admitted yesterday after presenting with several days of poor intake, abdominal pain, and several episodes of diarrhea.  Patient found to have AKI with a presenting creatinine of 2.16 with baseline creatinine in the [facility] that is around 1.1 consistent with CKD 3.  Presenting urine bicarbonate of 15 with an anion gap of 11 and an albumin of 3.0.  Today her bicarbonate is 12 with an anion gap of 12.  She also has a chronic metabolic acidosis with serum bicarbonate values ranging between 17-20.  Additionally she has intermittent hyperkalemia as an outpatient.  Home medications reviewed and as below.  In the ED she received 2 L of normal saline.  Diarrhea has resolved.  She is eating fairly well here today.  Urine output excellent.  CT of the abdomen yesterday with bilateral renal calculi with mild right-sided hydronephrosis but no hydroureter.  No obstructive bowel process or other identified inflammatory changes.  Serum calcium was normal.   Creatinine, Ser (mg/dL)  Date Value  46/96/2952 0.93  08/02/2023 1.25 (H)  08/02/2023 2.16 (H)  06/29/2019 0.71  ] I/Os: I/O last 3 completed shifts: In: 1658 [I.V.:569; IV Piggyback:1089] Out: 2380 [Urine:2380]   ROS NSAIDS: No exposure IV Contrast no exposure TMP/SMX no exposure Hypotension not present Balance of 12 systems is negative w/ exceptions as above  PMH  Past Medical History:  Diagnosis Date   Arthritis    Asthma due to seasonal allergies    Cancer (HCC)    skin cancer - face - basal   DM (diabetes mellitus) (HCC)    Environmental allergies    History of kidney stones    1 small in each kidney - has had for years   Hypertension    Right clavicle  fracture    Sarcoidosis    effecting the eyes   PSH  Past Surgical History:  Procedure Laterality Date   BRONCHOSCOPY      x 2 to determine if she has scarcoidosis   COLONOSCOPY W/ POLYPECTOMY     ORIF CLAVICULAR FRACTURE Right 07/12/2019   Procedure: OPEN REDUCTION INTERNAL FIXATION (ORIF) CLAVICULAR FRACTURE;  Surgeon: Yolonda Kida, MD;  Location: [facility] OR;  Service: Orthopedics;  Laterality: Right;   FH History reviewed. No pertinent family history. SH  reports that she has been smoking cigarettes. She has never used smokeless tobacco. She reports that she does not currently use alcohol. She reports that she does not currently use drugs. Allergies  Allergies  Allergen Reactions   Dorzolamide Itching   Home medications Prior to Admission medications   Medication Sig Start Date End Date Taking? Authorizing Provider  acetaminophen (TYLENOL) 500 MG tablet Take 500 mg by mouth every 6 (six) hours as needed for mild pain.   Yes [provider]  brimonidine (ALPHAGAN) 0.2 % ophthalmic solution Place 1 drop into the left eye in the morning and at bedtime.   Yes [provider]  celecoxib (CELEBREX) 200 MG capsule Take 1 capsule by mouth daily. 02/03/23  Yes [provider]  citalopram (CELEXA) 20 MG tablet Take 20 mg by mouth daily.   Yes [provider]  dorzolamide (TRUSOPT) 2 % ophthalmic solution Place 1 drop into the left eye 2 (two) times daily.   Yes  [provider]  doxylamine, Sleep, (UNISOM) 25 MG tablet Take 25 mg by mouth at bedtime.   Yes [provider]  ezetimibe-simvastatin (VYTORIN) 10-40 MG tablet Take 1 tablet by mouth daily.   Yes [provider]  metFORMIN (GLUCOPHAGE) 500 MG tablet Take 500 mg by mouth in the morning, at noon, and at bedtime.   Yes [provider]  metoprolol tartrate (LOPRESSOR) 50 MG tablet Take 50 mg by mouth daily.   Yes [provider]  Multiple Vitamins-Minerals  (CENTRUM VITAMINTS PO) Take 1 tablet by mouth daily.   Yes [provider]  mupirocin ointment (BACTROBAN) 2 % 1 Application at bedtime.   Yes [provider]  niacin (NIASPAN) 1000 MG CR tablet Take 1,000 mg by mouth daily.   Yes [provider]  Omega-3 Fatty Acids (FISH OIL) 1000 MG CAPS Take 1 capsule by mouth in the morning, at noon, and at bedtime.   Yes [provider]  Polyethyl Glycol-Propyl Glycol (SYSTANE HYDRATION PF OP) Place 4 drops into both eyes every evening.   Yes [provider]  prednisoLONE acetate (PRED FORTE) 1 % ophthalmic suspension INSTILL 1 DROP IN THE LEFT EYE EVERY OTHER DAY 08/25/22  Yes Rennis Chris, MD  sulfamethoxazole-trimethoprim (BACTRIM DS) 800-160 MG tablet Take 1 tablet by mouth 2 (two) times daily. 02/03/23  Yes [provider]  timolol (BETIMOL) 0.5 % ophthalmic solution Place 1 drop into both eyes 2 (two) times daily.   Yes [provider]    Current Medications Scheduled Meds:  brimonidine  1 drop Left Eye BID   citalopram  20 mg Oral Daily   dorzolamide  1 drop Left Eye BID   doxylamine (Sleep)  25 mg Oral QHS   ezetimibe  10 mg Oral q1800   And   simvastatin  40 mg Oral q1800   feeding supplement  237 mL Oral BID BM   heparin  5,000 Units Subcutaneous Q8H   insulin aspart  0-9 Units Subcutaneous TID WC   liver oil-zinc oxide   Topical BID   metoprolol succinate  50 mg Oral Daily   [START ON 08/04/2023] multivitamin with minerals  1 tablet Oral Daily   niacin  1,000 mg Oral Daily   omega-3 acid ethyl esters  1 g Oral Daily   ondansetron (ZOFRAN) IV  4 mg Intravenous Once   polyvinyl alcohol  4 drop Both Eyes QHS   prednisoLONE acetate  1 drop Left Eye QODAY   sodium bicarbonate  1,300 mg Oral TID   timolol  1 drop Both Eyes BID   Continuous Infusions:  azithromycin 500 mg (08/03/23 1018)   cefTRIAXone (ROCEPHIN)  IV 1 g (08/03/23 0939)   PRN Meds:.acetaminophen **OR**  acetaminophen, ipratropium-albuterol, ondansetron **OR** ondansetron (ZOFRAN) IV  CBC Recent Labs  Lab 08/02/23 1016 08/02/23 1203 08/03/23 0346  WBC 13.1*  --  18.3*  HGB 12.8 12.9 12.2  HCT 40.4 38.0 37.5  MCV 94.8  --  94.9  PLT 261  --  212   Basic Metabolic Panel Recent Labs  Lab 08/02/23 1016 08/02/23 1203 08/02/23 1702 08/03/23 0346  NA 135 142 140 141  K 5.1 4.5 4.1 4.3  CL 109  --  115* 117*  CO2 15*  --  13* 12*  GLUCOSE 266*  --  174* 143*  BUN 106*  --  75* 44*  CREATININE 2.16*  --  1.25* 0.93  CALCIUM 8.5*  --  8.1* 8.5*  Physical Exam  Blood pressure (!) 145/67, pulse 93, temperature 98.3 F (36.8 C), temperature source Oral, resp. rate 16, height 5' (1.524 m), weight 41.5 kg, SpO2 95%. GEN: Elderly female, sitting in a chair, no distress ENT: NCAT EYES: EOMI CV: Regular, normal S1 and S2 PULM: Clear bilaterally ABD: Soft, nontender SKIN: No rashes or lesions EXT: No edema  Assessment 10F with AKI on CKD3, acute on chronic normal anion gap metabolic acidosis.  She has chronic sarcoidosis.  AKI on CKD3: Rapidly recovered with hydration suggesting prerenal/hypovolemic cause.  Baseline creatinine around 1.1 as an outpatient consistent with CKD 3.  Imaging without significant findings, doubt that the mild hydronephrosis is clinically meaningful.  Continue to regard her volume status moving forward. NAGMA: Likely multifactorial.  Looks like she has a chronic mild metabolic acidosis with intermittent hyperkalemia.  Acute worsening likely diarrhea and normal saline.  Now on sodium bicarbonate and eating well, anticipate this will improve with time.  If persists can pursue further workup but I anticipate recovery with supportive care. Nephrolithiasis noted on CT Hypertension: Remains on home metoprolol, monitor.  Plan As above If additional hydration needed will use LR or bicarbonate based crystalloid. Daily weights, Daily Renal Panel, Strict I/Os, Avoid  nephrotoxins (NSAIDs, judicious IV Contrast) Will follow along   Sabrina Eaton  [phone] pgr 08/03/2023, 3:12 PM

## 2023-08-03 NOTE — Progress Notes (Signed)
 PROGRESS NOTE ANIS CINELLI  ZHY:[id number] DOB: 1940/06/03 DOA: 08/02/2023 PCP: Marcelle Overlie, MD  Brief Narrative/Hospital Course: 83 y.o. 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 In the ED: Afebrile BP hypertensive, labs with leukocytosis, hyperglycemia metabolic acidosis AKI BUN 106 creatinine 2.1. CT abdomen/pelvis without>> bilateral renal calculi and mild right-sided hydronephrosis, but no obvious hydroureter or obstructing renal ureteral calculi. No bowel obstructive process or obvious inflammatory changes. Normal appendix. Irregular masslike densities at the right lung base are an anterior [place] finding. This could be scar tissue, infiltrate, atelectasis or neoplastic process. Follow-up with CT chest with contrast in the future. Aortic atherosclerosis.  Patient admitted for sarcoidosis, gastroenteritis, PCCM consulted.   Subjective: Seen and examined this morning Resting comfortably no new complaint Overnight afebrile BP 140s-180s, on room air Labs shows AKI has resolved BUN down to 44 AST 44 ALT 51 TB normal, Pro-Cal 0.2 WBC of 18.3.  Assessment and plan:  AKI Acute on chronic metabolic acidosis Hyperkalemia: AKI In the setting of acute gastroenteritis poor oral intake ARB ACE metformin.  AKI has resolved with aggressive IV fluid hydration.  Tolerating diet will DC IV fluids chloride high contributing to her low bicarb- ?RTA- will d/w Nephro  Acute gastroenteritis: Patient reported taking Lipidem with improvement in the symptoms.  Continue supportive care diet as tolerated.  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 Follow-up with ophthalmology as an outpatient.   Sarcoidosis Pulmonary infiltrate-concerning for CAP Continue antibiotics ceftriaxone Netromycin bronchodilators, sputum culture, PCCM input appreciated-will treat with antibiotics for community-acquired pneumonia/atypical organism.  History of uveitis and eyedrops steroid:  Type 2 diabetes mellitus with uncontrolled hyperglycemia Continue carb modified diet, SSI Holding metformin due to AKI. Recent Labs  Lab 08/02/23 1019 08/02/23 1613 08/02/23 2015 08/03/23 0731 08/03/23 1150  GLUCAP 252* 131* 139* 211* 146*    Pressure injury of skin Continue local care and prevention measures.  Essential hypertension Continue metoprolol 50 mg p.o. daily. Will switch from tartrate to succinate form.   Hyperlipidemia  Continue Vytorin 10-40 mg p.o. daily.  Moderate malnutrition: augment diet as below Nutrition Problem: Moderate Malnutrition Etiology: chronic illness Signs/Symptoms: moderate fat depletion, severe muscle depletion Interventions: Refer to RD note for recommendations, Ensure Enlive (each supplement provides 350kcal and 20 grams of protein), MVI, Liberalize Diet     DVT prophylaxis: heparin injection 5,000 Units Start: 08/02/23 2200 Code Status:   Code Status: Full Code Family Communication: plan of care discussed with patient/son at bedside. Patient status is: Remains hospitalized because of severity of illness Level of care: Telemetry   Dispo: The patient is from: home w/ son            Anticipated disposition: TBD  Objective: Vitals last 24 hrs: Vitals:   08/02/23 2000 08/03/23 0000 08/03/23 0330 08/03/23 0940  BP: (!) 154/65 (!) 147/80 (!) 181/89 (!) 142/68  Pulse: (!) 107 96 95 97  Resp: 20     Temp: 99 F (37.2 C) 99 F (37.2 C) 98.8 F (37.1 C)   TempSrc: Oral Oral Oral   SpO2: 98% 99% 98%    Weight:  Height:       Weight change:   Physical Examination: General exam: alert awake, older than stated age HEENT:Oral mucosa moist, Ear/Nose WNL grossly Respiratory system: Bilaterally diminished BS, no use of accessory muscle Cardiovascular system: S1 & S2 +. Gastrointestinal system: Abdomen soft, NT,ND,BS+ Nervous System: Alert, awake,following commands. Extremities: LE edema neg, moving armsand legs, warm legs Skin: No rashes,warm. MSK: Normal muscle bulk/tone.   Medications reviewed:  Scheduled Meds:  brimonidine  1 drop Left Eye BID   citalopram  20 mg Oral Daily   dorzolamide  1 drop Left Eye BID   doxylamine (Sleep)  25 mg Oral QHS   ezetimibe  10 mg Oral q1800   And   simvastatin  40 mg Oral q1800   feeding supplement  237 mL Oral BID BM   heparin  5,000 Units Subcutaneous Q8H   insulin aspart  0-9 Units Subcutaneous TID WC   liver oil-zinc oxide   Topical BID   metoprolol succinate  50 mg Oral Daily   [START ON 08/04/2023] multivitamin with minerals  1 tablet Oral Daily   niacin  1,000 mg Oral Daily   ondansetron (ZOFRAN) IV  4 mg Intravenous Once   polyvinyl alcohol  4 drop Both Eyes QHS   prednisoLONE acetate  1 drop Left Eye QODAY   sodium bicarbonate  1,300 mg Oral TID   timolol  1 drop Both Eyes BID   Continuous Infusions:  azithromycin 500 mg (08/03/23 1018)   cefTRIAXone (ROCEPHIN)  IV 1 g (08/03/23 0939)    Pressure Injury 08/02/23 Coccyx Medial Stage 2 -  Partial thickness loss of dermis presenting as a shallow open injury with a red, pink wound bed without slough. (Active)  08/02/23 1608  Location: Coccyx  Location Orientation: Medial  Staging: Stage 2 -  Partial thickness loss of dermis presenting as a shallow open injury with a red, pink wound bed without slough.  Wound Description (Comments):   Present on Admission: Yes  Dressing Type Foam - Lift dressing to assess site every shift;Bismuth petroleum 08/03/23 0445   Diet Order              Diet Carb Modified Fluid consistency: Thin; Room service appropriate? Yes  Diet effective now                    Nutrition Problem: Moderate Malnutrition Etiology: chronic illness Signs/Symptoms: moderate fat depletion, severe muscle depletion Interventions: Refer to RD note for recommendations, Ensure Enlive (each supplement provides 350kcal and 20 grams of protein), MVI, Liberalize Diet   Intake/Output Summary (Last 24 hours) at 08/03/2023 1306 Last data filed at 08/03/2023 0955 Gross per 24 hour  Intake 657.95 ml  Output 2480 ml  Net -1822.05 ml   Net IO Since Admission: -822.05 mL [08/03/23 1306]  Wt Readings from Last 3 Encounters:  08/02/23 41.5 kg  07/29/22 45 kg  12/10/20 47.6 kg     Unresulted Labs (From admission, onward)     Start     Ordered   08/04/23 0500  Basic metabolic panel with GFR  Tomorrow morning,   R        08/03/23 1301   08/04/23 0500  CBC  Tomorrow morning,   R        08/03/23 1301   08/03/23 1302  C Difficile Quick Screen w PCR reflex  (C Difficile quick screen w PCR reflex panel )  Once, for 24 hours,   TIMED  References:    CDiff Information Tool   08/03/23 1301   08/03/23 0500  Hemoglobin A1c  Tomorrow morning,   R       Comments: To assess prior glycemic control    08/02/23 1502          Data Reviewed: I have personally reviewed following labs and imaging studies ( see epic result tab) CBC: Recent Labs  Lab 08/02/23 1016 08/02/23 1203 08/03/23 0346  WBC 13.1*  --  18.3*  HGB 12.8 12.9 12.2  HCT 40.4 38.0 37.5  MCV 94.8  --  94.9  PLT 261  --  212   CMP: Recent Labs  Lab 08/02/23 1016 08/02/23 1203 08/02/23 1702 08/03/23 0346  NA 135 142 140 141  K 5.1 4.5 4.1 4.3  CL 109  --  115* 117*  CO2 15*  --  13* 12*  GLUCOSE 266*  --  174* 143*  BUN 106*  --  75* 44*  CREATININE 2.16*  --  1.25* 0.93  CALCIUM 8.5*  --  8.1* 8.5*   GFR: Estimated Creatinine Clearance: 31.6 mL/min (by C-G formula based on SCr of  0.93 mg/dL). Recent Labs  Lab 08/03/23 0346  AST 44*  ALT 51*  ALKPHOS 117  BILITOT 0.7  PROT 7.0  ALBUMIN 3.0*   Recent Labs  Lab 08/02/23 1019 08/02/23 1613 08/02/23 2015 08/03/23 0731 08/03/23 1150  GLUCAP 252* 131* 139* 211* 146*   No results for input(s): "CHOL", "HDL", "LDLCALC", "TRIG", "CHOLHDL", "LDLDIRECT" in the last 72 hours. No results for input(s): "TSH", "T4TOTAL", "FREET4", "T3FREE", "THYROIDAB" in the last 72 hours. Sepsis Labs: Recent Labs  Lab 08/02/23 1149 08/02/23 1237 08/03/23 0346  PROCALCITON  --  0.48 0.23  LATICACIDVEN 1.3  --   --    Recent Results (from the past 240 hours)  Resp panel by RT-PCR (RSV, Flu A&B, Covid) Urine, Clean Catch     Status: None   Collection Time: 08/02/23 10:37 AM   Specimen: Urine, Clean Catch; Nasal Swab  Result Value Ref Range Status   SARS Coronavirus 2 by RT PCR NEGATIVE NEGATIVE Final    Comment: (NOTE) SARS-CoV-2 target nucleic acids are NOT DETECTED.  The SARS-CoV-2 RNA is generally detectable in upper respiratory specimens during the acute phase of infection. The lowest concentration of SARS-CoV-2 viral copies this assay can detect is 138 copies/mL. A negative result does not preclude SARS-Cov-2 infection and should not be used as the sole basis for treatment or other patient management decisions. A negative result may occur with  improper specimen collection/handling, submission of specimen other than nasopharyngeal swab, presence of viral mutation(s) within the areas targeted by this assay, and inadequate number of viral copies(<138 copies/mL). A negative result must be combined with clinical observations, patient history, and epidemiological information. The expected result is Negative.  Fact Sheet for Patients:  BloggerCourse.com  Fact Sheet for Healthcare Providers:  SeriousBroker.it  This test is no t yet approved or cleared by the [place] FDA and  has been authorized for detection and/or diagnosis of SARS-CoV-2 by FDA under an Emergency Use Authorization (EUA). This EUA will remain  in effect (meaning this test can be used) for the duration of the COVID-19 declaration under Section 564(b)(1) of the Act, 21 U.S.C.section 360bbb-3(b)(1), unless the authorization is terminated  or revoked sooner.       Influenza A by PCR NEGATIVE NEGATIVE Final   Influenza B by PCR NEGATIVE NEGATIVE Final  Comment: (NOTE) The Xpert Xpress SARS-CoV-2/FLU/RSV plus assay is intended as an aid in the diagnosis of influenza from Nasopharyngeal swab specimens and should not be used as a sole basis for treatment. Nasal washings and aspirates are unacceptable for Xpert Xpress SARS-CoV-2/FLU/RSV testing.  Fact Sheet for Patients: BloggerCourse.com  Fact Sheet for Healthcare Providers: SeriousBroker.it  This test is not yet approved or cleared by the [place] FDA and has been authorized for detection and/or diagnosis of SARS-CoV-2 by FDA under an Emergency Use Authorization (EUA). This EUA will remain in effect (meaning this test can be used) for the duration of the COVID-19 declaration under Section 564(b)(1) of the Act, 21 U.S.C. section 360bbb-3(b)(1), unless the authorization is terminated or revoked.     Resp Syncytial Virus by PCR NEGATIVE NEGATIVE Final    Comment: (NOTE) Fact Sheet for Patients: BloggerCourse.com  Fact Sheet for Healthcare Providers: SeriousBroker.it  This test is not yet approved or cleared by the [place] FDA and has been authorized for detection and/or diagnosis of SARS-CoV-2 by FDA under an Emergency Use Authorization (EUA). This EUA will remain in effect (meaning this test can be used) for the duration of the COVID-19 declaration under Section 564(b)(1) of the Act, 21 U.S.C. section  360bbb-3(b)(1), unless the authorization is terminated or revoked.  Performed at [facility], [address], [place], [place] [address]   Culture, blood (routine x 2)     Status: None (Preliminary result)   Collection Time: 08/02/23  3:22 PM   Specimen: BLOOD  Result Value Ref Range Status   Specimen Description   Final    BLOOD BLOOD LEFT ARM Performed at [facility], [address] [place], [place] [address]    Special Requests   Final    BOTTLES DRAWN AEROBIC AND ANAEROBIC Blood Culture adequate volume Performed at [facility], [address] [place], [place] [address]    Culture   Final    NO GROWTH < 12 HOURS Performed at [facility], [address] [place], [place] [address]    Report Status PENDING  Incomplete  Culture, blood (routine x 2)     Status: None (Preliminary result)   Collection Time: 08/02/23  3:22 PM   Specimen: BLOOD  Result Value Ref Range Status   Specimen Description   Final    BLOOD BLOOD LEFT HAND Performed at [facility], [address], [place], [place] [address]    Special Requests   Final    BOTTLES DRAWN AEROBIC AND ANAEROBIC Blood Culture adequate volume Performed at [facility], [address], [place], [place] [address]    Culture   Final    NO GROWTH < 12 HOURS Performed at [facility], [address], [place], [place] [address]    Report Status PENDING  Incomplete     Antimicrobials/Microbiology: Anti-infectives (From admission, onward)    Start     Dose/Rate Route Frequency Ordered Stop   08/03/23 1000  cefTRIAXone (ROCEPHIN) 1 g in sodium chloride 0.9 % 100 mL IVPB        1 g 200 mL/hr over 30 Minutes Intravenous Every 24 hours 08/02/23 1519 08/08/23 0959   08/03/23 1000  azithromycin (ZITHROMAX) 500 mg in sodium chloride 0.9 % 250 mL IVPB        500 mg 250 mL/hr over 60 Minutes Intravenous Every 24 hours  08/02/23 1519 08/07/23 0959   08/02/23 1230  cefTRIAXone (  ROCEPHIN) 1 g in sodium chloride 0.9 % 100 mL IVPB        1 g 200 mL/hr over 30 Minutes Intravenous  Once 08/02/23 1219 08/02/23 1616   08/02/23 1230  azithromycin (ZITHROMAX) 500 mg in sodium chloride 0.9 % 250 mL IVPB        500 mg 250 mL/hr over 60 Minutes Intravenous  Once 08/02/23 1219 08/02/23 1755         Component Value Date/Time   SDES  08/02/2023 1522    BLOOD BLOOD LEFT ARM Performed at [facility], [address] [place], [place] [address]    SDES  08/02/2023 1522    BLOOD BLOOD LEFT HAND Performed at [facility], [address] [place], [place] [address]    SPECREQUEST  08/02/2023 1522    BOTTLES DRAWN AEROBIC AND ANAEROBIC Blood Culture adequate volume Performed at [facility], [address], [place], [place] [address]    SPECREQUEST  08/02/2023 1522    BOTTLES DRAWN AEROBIC AND ANAEROBIC Blood Culture adequate volume Performed at [facility], [address] [place], [place] [address]    CULT  08/02/2023 1522    NO GROWTH < 12 HOURS Performed at [facility], [address], [place], [place] [address]    CULT  08/02/2023 1522    NO GROWTH < 12 HOURS Performed at [facility], [address] [place], [place] [address]    REPTSTATUS PENDING 08/02/2023 1522   REPTSTATUS PENDING 08/02/2023 1522     Radiology Studies: CT Chest Wo Contrast Result Date: 08/02/2023 CLINICAL DATA:  Abnormal x-ray, lung nodule. EXAM: CT CHEST WITHOUT CONTRAST TECHNIQUE: Multidetector CT imaging of the chest was performed following the standard protocol without IV contrast. RADIATION DOSE REDUCTION: This exam was performed according to the departmental dose-optimization program which includes automated exposure control, adjustment of the mA and/or kV according to patient size and/or use of iterative reconstruction technique. COMPARISON:   07/29/2022. FINDINGS: Cardiovascular: The heart is normal in size and there is a trace pericardial effusion. Multi-vessel coronary artery calcifications are noted. There is atherosclerotic calcification of the aorta with mild dilatation of the ascending aorta measuring 4.0 cm. There is aneurysmal dilatation of the proximal descending aorta measuring 3.8 cm. The pulmonary trunk is normal in caliber. Mediastinum/Nodes: Enlarged lymph node is noted in the precarinal space measuring 1.3 cm, may be reactive. No axillary lymphadenopathy. Evaluation of the hila is limited due to lack of IV contrast. The thyroid gland, trachea, and esophagus are within normal limits. Lungs/Pleura: Mild biapical pleural and parenchymal thickening is noted. Bronchiectasis with bronchial wall thickening is present bilaterally. Scattered ill-defined ground-glass attenuation is noted in the lungs bilaterally. A 3 mm nodule is present in the left lower lobe, axial image 85. Patchy and strandy airspace disease is present in the lower lobes bilaterally. There is a trace left pleural effusion. Upper Abdomen: Renal calculi are noted on the left. No acute abnormality. Musculoskeletal: Degenerative changes are present in the thoracic spine. Fixation hardware is noted in the right clavicle. No acute osseous abnormality is seen. IMPRESSION: 1. Scattered ground-glass opacities in the lungs bilaterally with patchy airspace disease in the lower lobes, likely infectious or inflammatory. Three-month follow-up is recommended to document resolution. 2. 3 mm left upper lobe nodule. No follow-up needed if patient is low-risk.This recommendation follows the consensus statement: Guidelines for Management of Incidental Pulmonary Nodules Detected on CT Images: From the Fleischner  Society 2017; Radiology 2017; [phone]. 3. Coronary artery calcifications. 4. 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 Recommend annual  imaging followup by CTA or MRA. This recommendation follows 2010 ACCF/AHA/AATS/ACR/ASA/SCA/SCAI/SIR/STS/SVM Guidelines for the Diagnosis and Management of Patients with Thoracic Aortic Disease. Circulation. 2010; 121: O962-X528. Aortic aneurysm NOS (ICD10-I71.9) 5. Left renal calculi. Electronically Signed   By: Thornell Sartorius M.D.   On: 08/02/2023 12:06   CT ABDOMEN PELVIS WO CONTRAST Result Date: 08/02/2023 CLINICAL DATA:  Abdominal pain, diarrhea and weakness. EXAM: CT ABDOMEN AND PELVIS WITHOUT CONTRAST TECHNIQUE: Multidetector CT imaging of the abdomen and pelvis was performed following the standard protocol without IV contrast. RADIATION DOSE REDUCTION: This exam was performed according to the departmental dose-optimization program which includes automated exposure control, adjustment of the mA and/or kV according to patient size and/or use of iterative reconstruction technique. COMPARISON:  None Available. FINDINGS: Limited examination without IV or oral contrast in this patient with very little intraperitoneal fat. Lower chest: Irregular masslike densities at the right lung base are and indeterminate finding. Possible scarring changes., infiltrate, atelectasis or neoplastic process. Left basilar scarring type changes. No pleural effusions. No pericardial effusion. Advanced aortic and coronary artery calcifications. Hepatobiliary: No obvious hepatic lesions without contrast. No intrahepatic biliary dilatation. The gallbladder is grossly normal. No common bile duct dilatation. Pancreas: No mass, inflammation or ductal dilatation. Spleen: Normal size.  No focal lesions.  Small accessory spleen. Adrenals/Urinary Tract: The adrenal glands are unremarkable. Bilateral renal calculi and mild right-sided hydronephrosis but no obvious hydroureter or obstructing ureteral. The bladder is mildly distended but no bladder calculi or mass. Stomach/Bowel: The stomach, duodenum, small bowel and colon grossly normal without  oral contrast. No findings for on obstructive process or obvious inflammatory changes. The appendix appears normal. Vascular/Lymphatic: Atherosclerotic calcifications involving the aorta and iliac arteries but no aneurysm. No mesenteric or retroperitoneal mass or adenopathy. Reproductive: No significant findings. Other: No significant abdominal or pelvic fluid collections. No abdominal wall hernia or subcutaneous lesions. Musculoskeletal: No significant bony findings. IMPRESSION: 1. Bilateral renal calculi and mild right-sided hydronephrosis but no obvious hydroureter or obstructing ureteral calculi. 2. No bowel obstructive process or obvious inflammatory changes. Normal appendix. 3. Irregular masslike densities at the right lung base are an indeterminate finding. Possible scarring changes, infiltrate, atelectasis or neoplastic process. Recommend follow-up chest CT with contrast. 4. Aortic atherosclerosis. Electronically Signed   By: Rudie Meyer M.D.   On: 08/02/2023 11:36    LOS: 1 day   Total time spent in review of labs and imaging, patient evaluation, formulation of plan, documentation and communication with patient/family: 35 minutes  Lanae Boast, MD [facility] 08/03/2023, 1:06 PM

## 2023-08-03 NOTE — Plan of Care (Signed)

## 2023-08-03 NOTE — Progress Notes (Addendum)
 NAME:  Sabrina Eaton, MRN:  [id number], DOB:  [birth year], LOS: 1 ADMISSION DATE:  08/02/2023, CONSULTATION DATE: 08/02/2023 REFERRING MD: Dr. 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 Marisa Hua at [facility] 02/06/2023-at that time was not having any problems with her breathing CT scan March 2024  Pertinent  Medical History   Past Medical History:  Diagnosis Date   Arthritis    Asthma due to seasonal allergies    Cancer (HCC)    skin cancer - face - basal   DM (diabetes mellitus) (HCC)    Environmental allergies    History of kidney stones    1 small in each kidney - has had for years   Hypertension    Right clavicle fracture    Sarcoidosis    effecting the eyes   Significant Hospital Events: Including procedures, antibiotic start and stop dates in addition to other pertinent events   08/02/2023 CT scan of the chest reviewed-adenopathy, groundglass changes, worse at the bases  Interim History / Subjective:  Today denies complaints. Is not currently on systemic DMARDs for sarcoid.   Objective   Blood pressure (!) 181/89, pulse 95, temperature 98.8 F (37.1 C), temperature source Oral, resp. rate 20, height 5' (1.524 m), weight 41.5 kg, SpO2 98%.        Intake/Output Summary (Last 24 hours) at 08/03/2023 0756 Last data filed at 08/03/2023 0601 Gross per 24 hour  Intake 1657.95 ml  Output 2380 ml  Net -722.05 ml   Filed Weights   08/02/23 1013 08/02/23 1432  Weight: 47 kg 41.5 kg    Examination: General: Frail-appearing elderly  woman lying in bed no acute distress HENT: [place]/AT, eyes anicteric Lungs: Breathing comfortably on RA, faint rales posteriorly, symmetric.  No conversational dyspnea. Cardiovascular: S1-S2, regular rate and rhythm Abdomen: Soft, nondistended Extremities: Minimal ankle edema, no cyanosis Derm: Warm, no diffuse rashes Neuro: awake, alert, answering questions properly  Bicarb 12, anion gap 12 BUN 44 Creatinine 0.93 WBC 18.3 H/H 12.2/37.5 Platelets 212  Resolved Hospital Problem list     Assessment & Plan:  History of sarcoidosis with recent evaluation with a CT scan in March 2024 only showing mediastinal adenopathy, no lung infiltrate was described on the previous CT Suspect multilobar community acquired pneumonia causing multilobar parenchymal changes -con't empiric antibiotic therapy with Rocephin and azithromycin; plan for 7 days total (5 days azithromycin) -not immunocompromised, so not at particularly high risk of resistant organisms or OI  History of uveitis  -con't PTA steroid eyedrops  Elevated LFTs -if these do not normalize after acute illness, needs evaluation for possible hepatic involvement with sarcoidosis, which could require a change in treatment  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 Not compensated on 4/6 ABG. Normal urine pH at admission. Acute worsening was likely due to diarrhea PTA. - enteral Bicarb supplementation started - Recommend evaluation for RTA, consider nephrology consultation.  Acute kidney injury, likely prerenal from diarrhea and poor PO intake -Volume resuscitated - Maintain adequate perfusion -Strict I's/O - Renally dose meds  and avoid nephrotoxic meds  History of diabetes, hypertension  -hold PTA metformin due to AKI -con't PTA metoprolol  History of tobacco abuse; quit smoking December 2024 -recommend ongoing cessation  Glaucoma -con't PTA eyedrops- trusopt, brimonidine,  timolol  Son Reita Cliche [phone]) updated via phone.  OP follow up requested in pulm office in 1 month. PCCM will be available as needed; please call with questions.   Best Practice (right click and "Reselect all SmartList Selections" daily)   Per primary  Labs   CBC: Recent Labs  Lab 08/02/23 1016 08/02/23 1203 08/03/23 0346  WBC 13.1*  --  18.3*  HGB 12.8 12.9 12.2  HCT 40.4 38.0 37.5  MCV 94.8  --  94.9  PLT 261  --  212    Basic Metabolic Panel: Recent Labs  Lab 08/02/23 1016 08/02/23 1203 08/02/23 1702 08/03/23 0346  NA 135 142 140 141  K 5.1 4.5 4.1 4.3  CL 109  --  115* 117*  CO2 15*  --  13* 12*  GLUCOSE 266*  --  174* 143*  BUN 106*  --  75* 44*  CREATININE 2.16*  --  1.25* 0.93  CALCIUM 8.5*  --  8.1* 8.5*   GFR: Estimated Creatinine Clearance: 31.6 mL/min (by C-G formula based on SCr of 0.93 mg/dL). Recent Labs  Lab 08/02/23 1016 08/02/23 1149 08/02/23 1237 08/03/23 0346  PROCALCITON  --   --  0.48 0.23  WBC 13.1*  --   --  18.3*  LATICACIDVEN  --  1.3  --   --     Liver Function Tests: Recent Labs  Lab 08/03/23 0346  AST 44*  ALT 51*  ALKPHOS 117  BILITOT 0.7  PROT 7.0  ALBUMIN 3.0*   Steffanie Dunn, DO 08/03/23 10:44 AM [facility]  For contact information, see Amion. If no response to pager, please call PCCM consult pager. After hours, 7PM- 7AM, please call Elink.

## 2023-08-03 NOTE — Progress Notes (Signed)
 Initial Nutrition Assessment  DOCUMENTATION CODES:   Non-severe (moderate) malnutrition in context of chronic illness  INTERVENTION:  - Liberalize diet to Carb Modified to provide a wider variety of menu options and avoid restricting intake given malnutrition.  - Ensure Plus High Protein po BID, each supplement provides 350 kcal and 20 grams of protein. - Encourage intake at all meals and of supplements.  - Multivitamin with minerals daily - Monitor weight trends.   NUTRITION DIAGNOSIS:   Moderate Malnutrition related to chronic illness as evidenced by moderate fat depletion, severe muscle depletion  GOAL:   Patient will meet greater than or equal to 90% of their needs  MONITOR:   PO intake, Supplement acceptance, Weight trends  REASON FOR ASSESSMENT:   Malnutrition Screening Tool (low BMI)    ASSESSMENT:   83 y.o. 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 Admitted for AKI.  Patient reports a UBW of 98# and that she has lost a lot of weight over the past year.  Per EMR, patient weighed at 99# 1 year ago. She is weighed at both 103# and 91# this admission and she feels 103# is more accurate.   Patient notes she typically eats 2 meals a day at home and drinks 1 ONS daily (unsure of the brand).  Over the past week, her appetite has been poor so she has been eating less.   Although her appetite is still decreased, she reports eating fairly well at breakfast this morning. Had pancakes and orange juice.   Encouraged patient to order 3 meals a day and eat as much as tolerated. Discussed frequent meals and snacks to stimulate appetite. She is agreeable to receive Ensure during admission.    Medications reviewed and include: 1000mg  Niacin  Labs  reviewed:  - HA1C pending   NUTRITION - FOCUSED PHYSICAL EXAM:  Flowsheet Row Most Recent Value  Orbital Region Moderate depletion  Upper Arm Region Mild depletion  Thoracic and Lumbar Region Unable to assess  Buccal Region Severe depletion  Temple Region Severe depletion  Clavicle Bone Region Severe depletion  Clavicle and Acromion Bone Region Severe depletion  Scapular Bone Region Unable to assess  Dorsal Hand Moderate depletion  Patellar Region Mild depletion  Anterior Thigh Region Mild depletion  Posterior Calf Region No depletion  Edema (RD Assessment) None  Hair Reviewed  Eyes Reviewed  Mouth Reviewed  Skin Reviewed  Nails Reviewed       Diet Order:   Diet Order             Diet heart healthy/carb modified Room service appropriate? Yes; Fluid consistency: Thin  Diet effective now                   EDUCATION NEEDS:  Education needs have been addressed  Skin:  Skin Assessment: Skin Integrity Issues: Skin Integrity Issues:: Stage II Stage II: Coccyx  Last BM:  4/6  Height:  Ht Readings from Last 1 Encounters:  08/02/23 5' (1.524 m)   Weight:  Wt Readings from Last 1 Encounters:  08/02/23 41.5 kg    BMI:  Body mass index is 17.87 kg/m.  Estimated Nutritional Needs:  Kcal:  1450-1650 kcals Protein:  65-75 grams Fluid:  >/= 1.5L    Shelle Iron RD, LDN Contact via Secure Chat.

## 2023-08-04 ENCOUNTER — Other Ambulatory Visit (HOSPITAL_COMMUNITY): Payer: Self-pay

## 2023-08-04 DIAGNOSIS — N183 Chronic kidney disease, stage 3 unspecified: Secondary | ICD-10-CM | POA: Diagnosis not present

## 2023-08-04 DIAGNOSIS — I129 Hypertensive chronic kidney disease with stage 1 through stage 4 chronic kidney disease, or unspecified chronic kidney disease: Secondary | ICD-10-CM | POA: Diagnosis not present

## 2023-08-04 DIAGNOSIS — E872 Acidosis, unspecified: Secondary | ICD-10-CM | POA: Diagnosis not present

## 2023-08-04 DIAGNOSIS — N179 Acute kidney failure, unspecified: Secondary | ICD-10-CM | POA: Diagnosis not present

## 2023-08-04 LAB — GLUCOSE, CAPILLARY
Glucose-Capillary: 155 mg/dL — ABNORMAL HIGH (ref 70–99)
Glucose-Capillary: 151 mg/dL — ABNORMAL HIGH (ref 70–99)

## 2023-08-04 LAB — BASIC METABOLIC PANEL WITH GFR
Anion gap: 9 (ref 5–15)
BUN: 20 mg/dL (ref 8–23)
CO2: 21 mmol/L — ABNORMAL LOW (ref 22–32)
Calcium: 9 mg/dL (ref 8.9–10.3)
Chloride: 111 mmol/L (ref 98–111)
Creatinine, Ser: 0.65 mg/dL (ref 0.44–1.00)
GFR, Estimated: >60 mL/min (ref >60)
Glucose, Bld: 143 mg/dL — ABNORMAL HIGH (ref 70–99)
Potassium: 3.7 mmol/L (ref 3.5–5.1)
Sodium: 141 mmol/L (ref 135–145)

## 2023-08-04 LAB — C DIFFICILE QUICK SCREEN W PCR REFLEX
C Diff antigen: NEGATIVE
C Diff interpretation: NOT DETECTED
C Diff toxin: NEGATIVE

## 2023-08-04 LAB — CBC
HCT: 38.7 % (ref 36.0–46.0)
Hemoglobin: 12.3 g/dL (ref 12.0–15.0)
MCH: 30.1 pg (ref 26.0–34.0)
MCHC: 31.8 g/dL (ref 30.0–36.0)
MCV: 94.6 fL (ref 80.0–100.0)
Platelets: 223 10*3/uL (ref 150–400)
RBC: 4.09 MIL/uL (ref 3.87–5.11)
RDW: 18.5 % — ABNORMAL HIGH (ref 11.5–15.5)
WBC: 15.5 10*3/uL — ABNORMAL HIGH (ref 4.0–10.5)
nRBC: 0 % (ref 0.0–0.2)

## 2023-08-04 MED ORDER — AZITHROMYCIN 500 MG PO TABS
500.0000 mg | ORAL_TABLET | Freq: Every day | ORAL | 0 refills | Status: AC
  Filled 2023-08-04: qty 3, 3d supply, fill #0

## 2023-08-04 MED ORDER — CEFADROXIL 500 MG PO CAPS
500.0000 mg | ORAL_CAPSULE | Freq: Two times a day (BID) | ORAL | 0 refills | Status: AC
  Filled 2023-08-04: qty 10, 5d supply, fill #0

## 2023-08-04 MED ORDER — SODIUM BICARBONATE 650 MG PO TABS
650.0000 mg | ORAL_TABLET | Freq: Two times a day (BID) | ORAL | 0 refills | Status: AC
  Filled 2023-08-04: qty 60, 30d supply, fill #0

## 2023-08-04 MED ORDER — HYDRALAZINE HCL 20 MG/ML IJ SOLN
5.0000 mg | Freq: Once | INTRAMUSCULAR | Status: AC | PRN
  Administered 2023-08-04: 5 mg via INTRAVENOUS
  Filled 2023-08-04: qty 1

## 2023-08-04 MED ORDER — HYDRALAZINE HCL 25 MG PO TABS
25.0000 mg | ORAL_TABLET | Freq: Three times a day (TID) | ORAL | 0 refills | Status: DC
  Filled 2023-08-04: qty 90, 30d supply, fill #0

## 2023-08-04 MED ORDER — HYDRALAZINE HCL 25 MG PO TABS
25.0000 mg | ORAL_TABLET | Freq: Three times a day (TID) | ORAL | Status: DC
  Administered 2023-08-04: 25 mg via ORAL
  Filled 2023-08-04: qty 1

## 2023-08-04 NOTE — TOC CM/SW Note (Signed)
 Transition of Care [facility]) - Inpatient Brief Assessment   Patient Details  Name: Sabrina Eaton MRN: [id number] Date of Birth: July 29, 1940  Transition of Care [facility] CM/SW Contact:    Larrie Kass, LCSW Phone Number: 08/04/2023, 1:19 PM     Transition of Care Asessment: Insurance and Status: Insurance coverage has been reviewed Patient has primary care physician: Yes Home environment has been reviewed: home with self Prior level of function:: mod independent Prior/Current Home Services: No current home services Social Drivers of Health Review: SDOH reviewed no interventions necessary Readmission risk has been reviewed: Yes Transition of care needs: no transition of care needs at this time

## 2023-08-04 NOTE — Discharge Summary (Signed)
 Physician Discharge Summary  Sabrina Eaton:[id number] DOB: [birth year] DOA: 08/02/2023  PCP: Marcelle Overlie, MD  Admit date: 08/02/2023 Discharge date: 08/04/2023 Recommendations for Outpatient Follow-up:  Follow up with PCP in 1 weeks-call for appointment Please obtain BMP/CBC in one week  Discharge Dispo: HOME Discharge Condition: Stable Code Status:   Code Status: Full Code Diet recommendation:  Diet Order             Diet Carb Modified Fluid consistency: Thin; Room service appropriate? Yes  Diet effective now                    Brief/Interim Summary: 83 y.o. 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 In the ED: Afebrile BP hypertensive, labs with leukocytosis, hyperglycemia metabolic acidosis AKI BUN 106 creatinine 2.1. CT abdomen/pelvis without>> bilateral renal calculi and mild right-sided hydronephrosis, but no obvious hydroureter or obstructing renal ureteral calculi. No bowel obstructive process or obvious inflammatory changes. Normal appendix. Irregular masslike densities at the right lung base are an anterior D[place]et finding. This could be scar tissue, infiltrate, atelectasis or neoplastic process. Follow-up with CT chest with contrast in the future. Aortic atherosclerosis.  Patient admitted for sarcoidosis, gastroenteritis, PCCM consulted. Patient hydrated, electrolytes improved AKI resolved bicarb improved.  At this time she is tolerating diet.  No more nausea or vomiting.  Blood pressure high but med adjusted. Discussed plan of care with patient's son and agreeable for discharge home if BP ok  today  Consultation: Pulmonary Nephrology  Subjective: Seen examined this morning Alert awake oriented has no complaint no itching BP running high 160s-200 on room air  Labs stable electrolytes WBC downtrending 15.5  Discharge diagnosis:   AKI NAGMA, acute on chronic, at baseline bicarb 17-20 Hyperkalemia: AKI In the setting of acute gastroenteritis poor oral intake ARB ACE metformin.  AKI has resolved with aggressive IV fluid hydration.  Diarrhea resolved.  Tolerating diet.  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   Recent Labs    08/02/23 1016 08/02/23 1203 08/02/23 1702 08/03/23 0346 08/04/23 0408  BUN 106*  --  75* 44* 20  CREATININE 2.16*  --  1.25* 0.93 0.65  CO2 15*  --  13* 12* 21*  K 5.1 4.5 4.1 4.3 3.7   Sarcoidosis Pulmonary infiltrate-concerning for CAP TTE ceftriaxone azithromycin bronchodilators.  Overall doing well on room air.   Seen by PCCM on discharge will transition to oral antibiotics -DC Rocephin azithromycin as per pulmonary recommendation    Acute gastroenteritis: Resolved.  C. 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 Follow-up with ophthalmology as an outpatient.   History of uveitis -  Cont eyedrops steroid  Type 2 diabetes mellitus with uncontrolled hyperglycemia Continue carb modified diet, SSI Holding metformin due to AKI. Recent Labs  Lab 08/03/23 0346 08/03/23 0731 08/03/23 1150 08/03/23 1635 08/03/23 2056 08/04/23 0733 08/04/23 1203  GLUCAP  --    < > 146* 214* 131* 155*  151*  HGBA1C 6.3*  --   --   --   --   --   --    < > = values in this interval not displayed.    Pressure injury of  skin Continue local care and prevention measures.  Essential hypertension BP poorly controlled today, added hydralazine, continue metoprolol 50 mg p.o. daily.  If BP remains stable today will discharge home   Hyperlipidemia  Continue Vytorin 10-40 mg p.o. daily.  Moderate malnutrition: augment diet as below Nutrition Problem: Moderate Malnutrition Etiology: chronic illness Signs/Symptoms: moderate fat depletion, severe muscle depletion Interventions: Refer to RD note for recommendations, Ensure Enlive (each supplement provides 350kcal and 20 grams of protein), MVI, Liberalize Diet     Discharge Exam: Vitals:   08/04/23 0610 08/04/23 1200  BP: (!) 160/84 136/77  Pulse: (!) 102 88  Resp:    Temp:    SpO2:     General: Pt is alert, awake, not in acute distress Cardiovascular: RRR, S1/S2 +, no rubs, no gallops Respiratory: CTA bilaterally, no wheezing, no rhonchi Abdominal: Soft, NT, ND, bowel sounds + Extremities: no edema, no cyanosis  Discharge Instructions  Discharge Instructions     Discharge instructions   Complete by: As directed    Please call call MD or return to ER for similar or worsening recurring problem that brought you to hospital or if any fever,nausea/vomiting,abdominal pain, uncontrolled pain, chest pain,  shortness of breath or any other alarming symptoms.  Please follow-up your doctor as instructed in a week time and call the office for appointment.  Please avoid alcohol, smoking, or any other illicit substance and maintain healthy habits including taking your regular medications as prescribed.  You were cared for by a hospitalist during your hospital stay. If you have any questions about your discharge medications or the care you received while you were in the hospital after you are discharged, you can call the unit and ask to speak with the hospitalist on call if the hospitalist that took care of you is not available.  Once you are discharged, your  primary care physician will handle any further medical issues. Please note that NO REFILLS for any discharge medications will be authorized once you are discharged, as it is imperative that you return to your primary care physician (or establish a relationship with a primary care physician if you do not have one) for your aftercare needs so that they can reassess your need for medications and monitor your lab values   Discharge wound care:   Complete by: As directed    Cleanse coccyx wound with NS, apply Xeroform gauze [place] [phone]) to wound bed daily.  Cover surrounding skin of buttocks with a thin layer of Desitin.  Cover with ABD pad and tape or silicone foam whichever is preferred.   Increase activity slowly   Complete by: As directed       Allergies as of 08/04/2023       Reactions   Dorzolamide Itching        Medication List     STOP taking these medications    sulfamethoxazole-trimethoprim 800-160 MG tablet Commonly known as: BACTRIM DS       TAKE these medications    acetaminophen 500 MG tablet Commonly known as: TYLENOL Take 500 mg by mouth every 6 (six) hours as needed for mild pain.   azithromycin 500 MG tablet Commonly known as: Zithromax Take 1 tablet (500 mg total) by mouth daily for 3 days.  brimonidine 0.2 % ophthalmic solution Commonly known as: ALPHAGAN Place 1 drop into the left eye in the morning and at bedtime.   cefadroxil 500 MG capsule Commonly known as: DURICEF Take 1 capsule (500 mg total) by mouth 2 (two) times daily for 5 days.   celecoxib 200 MG capsule Commonly known as: CELEBREX Take 1 capsule by mouth daily.   CENTRUM VITAMINTS PO Take 1 tablet by mouth daily.   citalopram 20 MG tablet Commonly known as: CELEXA Take 20 mg by mouth daily.   dorzolamide 2 % ophthalmic solution Commonly known as: TRUSOPT Place 1 drop into the left eye 2 (two) times daily.   doxylamine (Sleep) 25 MG tablet Commonly known as: UNISOM Take 25 mg  by mouth at bedtime.   ezetimibe-simvastatin 10-40 MG tablet Commonly known as: VYTORIN Take 1 tablet by mouth daily.   Fish Oil 1000 MG Caps Take 1 capsule by mouth in the morning, at noon, and at bedtime.   hydrALAZINE 25 MG tablet Commonly known as: APRESOLINE Take 1 tablet (25 mg total) by mouth 3 (three) times daily. Hold for sbp <130   metFORMIN 500 MG tablet Commonly known as: GLUCOPHAGE Take 500 mg by mouth in the morning, at noon, and at bedtime.   metoprolol tartrate 50 MG tablet Commonly known as: LOPRESSOR Take 50 mg by mouth daily.   mupirocin ointment 2 % Commonly known as: BACTROBAN 1 Application at bedtime.   niacin 1000 MG CR tablet Commonly known as: NIASPAN Take 1,000 mg by mouth daily.   prednisoLONE acetate 1 % ophthalmic suspension Commonly known as: PRED FORTE INSTILL 1 DROP IN THE LEFT EYE EVERY OTHER DAY   sodium bicarbonate 650 MG tablet Take 1 tablet (650 mg total) by mouth 2 (two) times daily.   SYSTANE HYDRATION PF OP Place 4 drops into both eyes every evening.   timolol 0.5 % ophthalmic solution Commonly known as: BETIMOL Place 1 drop into both eyes 2 (two) times daily.               Discharge Care Instructions  (From admission, onward)           Start     Ordered   08/04/23 0000  Discharge wound care:       Comments: Cleanse coccyx wound with NS, apply Xeroform gauze [place] [phone]) to wound bed daily.  Cover surrounding skin of buttocks with a thin layer of Desitin.  Cover with ABD pad and tape or silicone foam whichever is preferred.   08/04/23 1219            Follow-up Information     Marcelle Overlie, MD Follow up in 1 week(s).   Specialty: Obstetrics and Gynecology Contact information: [address] [place] [address] [phone]                Allergies  Allergen Reactions   Dorzolamide Itching    The results of significant diagnostics from this hospitalization (including  imaging, microbiology, ancillary and laboratory) are listed below for reference.    Microbiology: Recent Results (from the past 240 hours)  Resp panel by RT-PCR (RSV, Flu A&B, Covid) Urine, Clean Catch     Status: None   Collection Time: 08/02/23 10:37 AM   Specimen: Urine, Clean Catch; Nasal Swab  Result Value Ref Range Status   SARS Coronavirus 2 by RT PCR NEGATIVE NEGATIVE Final    Comment: (NOTE) SARS-CoV-2 target nucleic acids are NOT DETECTED.  The  SARS-CoV-2 RNA is generally detectable in upper respiratory specimens during the acute phase of infection. The lowest concentration of SARS-CoV-2 viral copies this assay can detect is 138 copies/mL. A negative result does not preclude SARS-Cov-2 infection and should not be used as the sole basis for treatment or other patient management decisions. A negative result may occur with  improper specimen collection/handling, submission of specimen other than nasopharyngeal swab, presence of viral mutation(s) within the areas targeted by this assay, and inadequate number of viral copies(<138 copies/mL). A negative result must be combined with clinical observations, patient history, and epidemiological information. The expected result is Negative.  Fact Sheet for Patients:  BloggerCourse.com  Fact Sheet for Healthcare Providers:  SeriousBroker.it  This test is no t yet approved or cleared by the [place] FDA and  has been authorized for detection and/or diagnosis of SARS-CoV-2 by FDA under an Emergency Use Authorization (EUA). This EUA will remain  in effect (meaning this test can be used) for the duration of the COVID-19 declaration under Section 564(b)(1) of the Act, 21 U.S.C.section 360bbb-3(b)(1), unless the authorization is terminated  or revoked sooner.       Influenza A by PCR NEGATIVE NEGATIVE Final   Influenza B by PCR NEGATIVE NEGATIVE Final    Comment: (NOTE) The  Xpert Xpress SARS-CoV-2/FLU/RSV plus assay is intended as an aid in the diagnosis of influenza from Nasopharyngeal swab specimens and should not be used as a sole basis for treatment. Nasal washings and aspirates are unacceptable for Xpert Xpress SARS-CoV-2/FLU/RSV testing.  Fact Sheet for Patients: BloggerCourse.com  Fact Sheet for Healthcare Providers: SeriousBroker.it  This test is not yet approved or cleared by the [place] FDA and has been authorized for detection and/or diagnosis of SARS-CoV-2 by FDA under an Emergency Use Authorization (EUA). This EUA will remain in effect (meaning this test can be used) for the duration of the COVID-19 declaration under Section 564(b)(1) of the Act, 21 U.S.C. section 360bbb-3(b)(1), unless the authorization is terminated or revoked.     Resp Syncytial Virus by PCR NEGATIVE NEGATIVE Final    Comment: (NOTE) Fact Sheet for Patients: BloggerCourse.com  Fact Sheet for Healthcare Providers: SeriousBroker.it  This test is not yet approved or cleared by the [place] FDA and has been authorized for detection and/or diagnosis of SARS-CoV-2 by FDA under an Emergency Use Authorization (EUA). This EUA will remain in effect (meaning this test can be used) for the duration of the COVID-19 declaration under Section 564(b)(1) of the Act, 21 U.S.C. section 360bbb-3(b)(1), unless the authorization is terminated or revoked.  Performed at [facility], [address], [place], [place] [address]   Culture, blood (routine x 2)     Status: None (Preliminary result)   Collection Time: 08/02/23  3:22 PM   Specimen: BLOOD  Result Value Ref Range Status   Specimen Description   Final    BLOOD BLOOD LEFT ARM Performed at [facility], [address] [place], [place] [address]    Special Requests   Final     BOTTLES DRAWN AEROBIC AND ANAEROBIC Blood Culture adequate volume Performed at [facility], [address] [place], [place] [address]    Culture   Final    NO GROWTH 2 DAYS Performed at [facility], [address] [place], [place] [address]    Report Status PENDING  Incomplete  Culture, blood (routine x 2)     Status: None (Preliminary result)  Collection Time: 08/02/23  3:22 PM   Specimen: BLOOD  Result Value Ref Range Status   Specimen Description   Final    BLOOD BLOOD LEFT HAND Performed at [facility], [address], [place], [place] [address]    Special Requests   Final    BOTTLES DRAWN AEROBIC AND ANAEROBIC Blood Culture adequate volume Performed at [facility], [address], [place], [place] [address]    Culture   Final    NO GROWTH 2 DAYS Performed at [facility], [address] [place], [place] [address]    Report Status PENDING  Incomplete  C Difficile Quick Screen w PCR reflex     Status: None   Collection Time: 08/03/23 10:07 PM   Specimen: STOOL  Result Value Ref Range Status   C Diff antigen NEGATIVE NEGATIVE Final   C Diff toxin NEGATIVE NEGATIVE Final   C Diff interpretation No C. difficile detected.  Final    Comment: Performed at [facility], [address], [place], [place] [address]    Procedures/Studies: CT Chest Wo Contrast Result Date: 08/02/2023 CLINICAL DATA:  Abnormal x-ray, lung nodule. EXAM: CT CHEST WITHOUT CONTRAST TECHNIQUE: Multidetector CT imaging of the chest was performed following the standard protocol without IV contrast. RADIATION DOSE REDUCTION: This exam was performed according to the departmental dose-optimization program which includes automated exposure control, adjustment of the mA and/or kV according to patient size and/or use of iterative reconstruction technique. COMPARISON:  07/29/2022. FINDINGS: Cardiovascular: The heart is normal in  size and there is a trace pericardial effusion. Multi-vessel coronary artery calcifications are noted. There is atherosclerotic calcification of the aorta with mild dilatation of the ascending aorta measuring 4.0 cm. There is aneurysmal dilatation of the proximal descending aorta measuring 3.8 cm. The pulmonary trunk is normal in caliber. Mediastinum/Nodes: Enlarged lymph node is noted in the precarinal space measuring 1.3 cm, may be reactive. No axillary lymphadenopathy. Evaluation of the hila is limited due to lack of IV contrast. The thyroid gland, trachea, and esophagus are within normal limits. Lungs/Pleura: Mild biapical pleural and parenchymal thickening is noted. Bronchiectasis with bronchial wall thickening is present bilaterally. Scattered ill-defined ground-glass attenuation is noted in the lungs bilaterally. A 3 mm nodule is present in the left lower lobe, axial image 85. Patchy and strandy airspace disease is present in the lower lobes bilaterally. There is a trace left pleural effusion. Upper Abdomen: Renal calculi are noted on the left. No acute abnormality. Musculoskeletal: Degenerative changes are present in the thoracic spine. Fixation hardware is noted in the right clavicle. No acute osseous abnormality is seen. IMPRESSION: 1. Scattered ground-glass opacities in the lungs bilaterally with patchy airspace disease in the lower lobes, likely infectious or inflammatory. Three-month follow-up is recommended to document resolution. 2. 3 mm left upper lobe nodule. No follow-up needed if patient is low-risk.This recommendation follows the consensus statement: Guidelines for Management of Incidental Pulmonary Nodules Detected on CT Images: From the Fleischner Society 2017; Radiology 2017; 284:228-243. 3. Coronary artery calcifications. 4. 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 Recommend annual imaging followup by CTA or MRA. This recommendation follows  2010 ACCF/AHA/AATS/ACR/ASA/SCA/SCAI/SIR/STS/SVM Guidelines for the Diagnosis and Management of Patients with Thoracic Aortic Disease. Circulation. 2010; 121: K440-N027. Aortic aneurysm NOS (ICD10-I71.9) 5. Left renal calculi. Electronically Signed   By: Thornell Sartorius M.D.   On: 08/02/2023 12:06   CT ABDOMEN PELVIS WO CONTRAST Result Date:  08/02/2023 CLINICAL DATA:  Abdominal pain, diarrhea and weakness. EXAM: CT ABDOMEN AND PELVIS WITHOUT CONTRAST TECHNIQUE: Multidetector CT imaging of the abdomen and pelvis was performed following the standard protocol without IV contrast. RADIATION DOSE REDUCTION: This exam was performed according to the departmental dose-optimization program which includes automated exposure control, adjustment of the mA and/or kV according to patient size and/or use of iterative reconstruction technique. COMPARISON:  None Available. FINDINGS: Limited examination without IV or oral contrast in this patient with very little intraperitoneal fat. Lower chest: Irregular masslike densities at the right lung base are and indeterminate finding. Possible scarring changes., infiltrate, atelectasis or neoplastic process. Left basilar scarring type changes. No pleural effusions. No pericardial effusion. Advanced aortic and coronary artery calcifications. Hepatobiliary: No obvious hepatic lesions without contrast. No intrahepatic biliary dilatation. The gallbladder is grossly normal. No common bile duct dilatation. Pancreas: No mass, inflammation or ductal dilatation. Spleen: Normal size.  No focal lesions.  Small accessory spleen. Adrenals/Urinary Tract: The adrenal glands are unremarkable. Bilateral renal calculi and mild right-sided hydronephrosis but no obvious hydroureter or obstructing ureteral. The bladder is mildly distended but no bladder calculi or mass. Stomach/Bowel: The stomach, duodenum, small bowel and colon grossly normal without oral contrast. No findings for on obstructive process or  obvious inflammatory changes. The appendix appears normal. Vascular/Lymphatic: Atherosclerotic calcifications involving the aorta and iliac arteries but no aneurysm. No mesenteric or retroperitoneal mass or adenopathy. Reproductive: No significant findings. Other: No significant abdominal or pelvic fluid collections. No abdominal wall hernia or subcutaneous lesions. Musculoskeletal: No significant bony findings. IMPRESSION: 1. Bilateral renal calculi and mild right-sided hydronephrosis but no obvious hydroureter or obstructing ureteral calculi. 2. No bowel obstructive process or obvious inflammatory changes. Normal appendix. 3. Irregular masslike densities at the right lung base are an indeterminate finding. Possible scarring changes, infiltrate, atelectasis or neoplastic process. Recommend follow-up chest CT with contrast. 4. Aortic atherosclerosis. Electronically Signed   By: Rudie Meyer M.D.   On: 08/02/2023 11:36    Labs: BNP (last 3 results) No results for input(s): "BNP" in the last 8760 hours. Basic Metabolic Panel: Recent Labs  Lab 08/02/23 1016 08/02/23 1203 08/02/23 1702 08/03/23 0346 08/04/23 0408  NA 135 142 140 141 141  K 5.1 4.5 4.1 4.3 3.7  CL 109  --  115* 117* 111  CO2 15*  --  13* 12* 21*  GLUCOSE 266*  --  174* 143* 143*  BUN 106*  --  75* 44* 20  CREATININE 2.16*  --  1.25* 0.93 0.65  CALCIUM 8.5*  --  8.1* 8.5* 9.0   Liver Function Tests: Recent Labs  Lab 08/03/23 0346  AST 44*  ALT 51*  ALKPHOS 117  BILITOT 0.7  PROT 7.0  ALBUMIN 3.0*   No results for input(s): "LIPASE", "AMYLASE" in the last 168 hours. No results for input(s): "AMMONIA" in the last 168 hours. CBC: Recent Labs  Lab 08/02/23 1016 08/02/23 1203 08/03/23 0346 08/04/23 0408  WBC 13.1*  --  18.3* 15.5*  HGB 12.8 12.9 12.2 12.3  HCT 40.4 38.0 37.5 38.7  MCV 94.8  --  94.9 94.6  PLT 261  --  212 223      Component Value Date/Time   COLORURINE YELLOW 08/02/2023 1016   APPEARANCEUR  CLEAR 08/02/2023 1016   LABSPEC 1.017 08/02/2023 1016   PHURINE 6.0 08/02/2023 1016   GLUCOSEU 250 (A) 08/02/2023 1016   HGBUR NEGATIVE 08/02/2023 1016   BILIRUBINUR NEGATIVE 08/02/2023 1016   KETONESUR  TRACE (A) 08/02/2023 1016   PROTEINUR TRACE (A) 08/02/2023 1016   NITRITE NEGATIVE 08/02/2023 1016   LEUKOCYTESUR NEGATIVE 08/02/2023 1016   Sepsis Labs Recent Labs  Lab 08/02/23 1016 08/03/23 0346 08/04/23 0408  WBC 13.1* 18.3* 15.5*   Microbiology Recent Results (from the past 240 hours)  Resp panel by RT-PCR (RSV, Flu A&B, Covid) Urine, Clean Catch     Status: None   Collection Time: 08/02/23 10:37 AM   Specimen: Urine, Clean Catch; Nasal Swab  Result Value Ref Range Status   SARS Coronavirus 2 by RT PCR NEGATIVE NEGATIVE Final    Comment: (NOTE) SARS-CoV-2 target nucleic acids are NOT DETECTED.  The SARS-CoV-2 RNA is generally detectable in upper respiratory specimens during the acute phase of infection. The lowest concentration of SARS-CoV-2 viral copies this assay can detect is 138 copies/mL. A negative result does not preclude SARS-Cov-2 infection and should not be used as the sole basis for treatment or other patient management decisions. A negative result may occur with  improper specimen collection/handling, submission of specimen other than nasopharyngeal swab, presence of viral mutation(s) within the areas targeted by this assay, and inadequate number of viral copies(<138 copies/mL). A negative result must be combined with clinical observations, patient history, and epidemiological information. The expected result is Negative.  Fact Sheet for Patients:  BloggerCourse.com  Fact Sheet for Healthcare Providers:  SeriousBroker.it  This test is no t yet approved or cleared by the [place] FDA and  has been authorized for detection and/or diagnosis of SARS-CoV-2 by FDA under an Emergency Use Authorization  (EUA). This EUA will remain  in effect (meaning this test can be used) for the duration of the COVID-19 declaration under Section 564(b)(1) of the Act, 21 U.S.C.section 360bbb-3(b)(1), unless the authorization is terminated  or revoked sooner.       Influenza A by PCR NEGATIVE NEGATIVE Final   Influenza B by PCR NEGATIVE NEGATIVE Final    Comment: (NOTE) The Xpert Xpress SARS-CoV-2/FLU/RSV plus assay is intended as an aid in the diagnosis of influenza from Nasopharyngeal swab specimens and should not be used as a sole basis for treatment. Nasal washings and aspirates are unacceptable for Xpert Xpress SARS-CoV-2/FLU/RSV testing.  Fact Sheet for Patients: BloggerCourse.com  Fact Sheet for Healthcare Providers: SeriousBroker.it  This test is not yet approved or cleared by the [place] FDA and has been authorized for detection and/or diagnosis of SARS-CoV-2 by FDA under an Emergency Use Authorization (EUA). This EUA will remain in effect (meaning this test can be used) for the duration of the COVID-19 declaration under Section 564(b)(1) of the Act, 21 U.S.C. section 360bbb-3(b)(1), unless the authorization is terminated or revoked.     Resp Syncytial Virus by PCR NEGATIVE NEGATIVE Final    Comment: (NOTE) Fact Sheet for Patients: BloggerCourse.com  Fact Sheet for Healthcare Providers: SeriousBroker.it  This test is not yet approved or cleared by the [place] FDA and has been authorized for detection and/or diagnosis of SARS-CoV-2 by FDA under an Emergency Use Authorization (EUA). This EUA will remain in effect (meaning this test can be used) for the duration of the COVID-19 declaration under Section 564(b)(1) of the Act, 21 U.S.C. section 360bbb-3(b)(1), unless the authorization is terminated or revoked.  Performed at [facility], [address] [place], [place] [address]   Culture, blood (routine x 2)     Status: None (Preliminary result)   Collection Time: 08/02/23  3:22 PM   Specimen: BLOOD  Result Value Ref Range Status   Specimen Description   Final    BLOOD BLOOD LEFT ARM Performed at [facility], [address], [place], [place] [address]    Special Requests   Final    BOTTLES DRAWN AEROBIC AND ANAEROBIC Blood Culture adequate volume Performed at [facility], [address], [place], [place] [address]    Culture   Final    NO GROWTH 2 DAYS Performed at [facility], [address], [place], [place] [address]    Report Status PENDING  Incomplete  Culture, blood (routine x 2)     Status: None (Preliminary result)   Collection Time: 08/02/23  3:22 PM   Specimen: BLOOD  Result Value Ref Range Status   Specimen Description   Final    BLOOD BLOOD LEFT HAND Performed at [facility], [address], [place], [place] [address]    Special Requests   Final    BOTTLES DRAWN AEROBIC AND ANAEROBIC Blood Culture adequate volume Performed at [facility], [address], [place], [place] [address]    Culture   Final    NO GROWTH 2 DAYS Performed at [facility], [address] [place], [place] [address]    Report Status PENDING  Incomplete  C Difficile Quick Screen w PCR reflex     Status: None   Collection Time: 08/03/23 10:07 PM   Specimen: STOOL  Result Value Ref Range Status   C Diff antigen NEGATIVE NEGATIVE Final   C Diff toxin NEGATIVE NEGATIVE Final   C Diff interpretation No C. difficile detected.  Final    Comment: Performed at [facility], [address], [place], [place] [address]   Time coordinating discharge: 25  minutes  SIGNED: Lanae Boast, MD  [facility] 08/04/2023, 1:36 PM  If 7PM-7AM, please contact night-coverage [email or web address]

## 2023-08-04 NOTE — Progress Notes (Signed)
 AVS given to patient and explained at the bedside. Medications and follow up appointments have been explained with pt verbalizing understanding.

## 2023-08-04 NOTE — Progress Notes (Signed)
 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  She has chronic sarcoidosis.   Subjective:  HCO3 21, SCr down to < 1 Good UOP  No c/o, good PO, no diarrhea Likely to DC today  04/07 0701 - 04/08 0700 In: 400 [P.O.:50; IV Piggyback:350] Out: 200 [Urine:200]  Filed Weights   08/02/23 1013 08/02/23 1432  Weight: 47 kg 41.5 kg    Scheduled Meds:  brimonidine  1 drop Left Eye BID   citalopram  20 mg Oral Daily   dorzolamide  1 drop Left Eye BID   doxylamine (Sleep)  25 mg Oral QHS   ezetimibe  10 mg Oral q1800   And   simvastatin  40 mg Oral q1800   feeding supplement  237 mL Oral BID BM   heparin  5,000 Units Subcutaneous Q8H   hydrALAZINE  25 mg Oral TID   insulin aspart  0-9 Units Subcutaneous TID WC   liver oil-zinc oxide   Topical BID   metoprolol succinate  50 mg Oral Daily   multivitamin with minerals  1 tablet Oral Daily   niacin  1,000 mg Oral Daily   omega-3 acid ethyl esters  1 g Oral Daily   ondansetron (ZOFRAN) IV  4 mg Intravenous Once   polyvinyl alcohol  4 drop Both Eyes QHS   prednisoLONE acetate  1 drop Left Eye QODAY   sodium bicarbonate  1,300 mg Oral TID   timolol  1 drop Both Eyes BID   Continuous Infusions:  azithromycin 500 mg (08/04/23 1211)   cefTRIAXone (ROCEPHIN)  IV 1 g (08/04/23 0835)   PRN Meds:.acetaminophen **OR** acetaminophen, ipratropium-albuterol, ondansetron **OR** ondansetron (ZOFRAN) IV  Current Labs: reviewed    Physical Exam:  Blood pressure 136/77, pulse 88, temperature 97.6 F (36.4 C), temperature source Oral, resp. rate 18, height 5' (1.524 m), weight 41.5 kg, SpO2 96%. 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  Baseline  creatinine around 1.1 as an outpatient consistent with CKD 3 but less than that here; outpt trending with PCP will help to clarify.  Imaging without significant findings, doubt that the mild hydronephrosis is clinically meaningful.   NAGMA: Resolved. Likely multifactorial.  Looks like she has a chronic mild metabolic acidosis with intermittent hyperkalemia.  Acute worsening likely diarrhea and normal saline.  Now on sodium bicarbonate and eating well, resolved. Cont NaHCO3 targeting serum HCO3 > 22. Nephrolithiasis noted on CT Hypertension: Remains on home metoprolol, monitor.  P OK for discharge No outpt nephrology f/u needed, PCP can manage All questions answered Will sign off for now.  Please call with any questions or concerns.   Sabra Heck MD 08/04/2023, 12:28 PM  Recent Labs  Lab 08/02/23 1702 08/03/23 0346 08/04/23 0408  NA 140 141 141  K 4.1 4.3 3.7  CL 115* 117* 111  CO2 13* 12* 21*  GLUCOSE 174* 143* 143*  BUN 75* 44* 20  CREATININE 1.25* 0.93 0.65  CALCIUM 8.1* 8.5* 9.0   Recent Labs  Lab 08/02/23 1016 08/02/23 1203 08/03/23 0346 08/04/23 0408  WBC 13.1*  --  18.3* 15.5*  HGB 12.8 12.9 12.2 12.3  HCT 40.4 38.0 37.5 38.7  MCV 94.8  --  94.9 94.6  PLT 261  --  212 223

## 2023-08-07 LAB — CULTURE, BLOOD (ROUTINE X 2)
Culture: NO GROWTH
Culture: NO GROWTH
Special Requests: ADEQUATE
Special Requests: ADEQUATE

## 2023-08-19 DIAGNOSIS — D869 Sarcoidosis, unspecified: Secondary | ICD-10-CM | POA: Diagnosis not present

## 2023-08-19 DIAGNOSIS — J3489 Other specified disorders of nose and nasal sinuses: Secondary | ICD-10-CM | POA: Diagnosis not present

## 2023-08-19 DIAGNOSIS — J320 Chronic maxillary sinusitis: Secondary | ICD-10-CM | POA: Diagnosis not present

## 2023-08-27 DIAGNOSIS — E119 Type 2 diabetes mellitus without complications: Secondary | ICD-10-CM | POA: Diagnosis not present

## 2023-09-08 ENCOUNTER — Ambulatory Visit: Admitting: Pulmonary Disease

## 2023-09-08 ENCOUNTER — Encounter: Payer: Self-pay | Admitting: Pulmonary Disease

## 2023-09-08 VITALS — BP 148/77 | HR 71 | Ht 60.0 in | Wt 97.6 lb

## 2023-09-08 DIAGNOSIS — Z87891 Personal history of nicotine dependence: Secondary | ICD-10-CM | POA: Diagnosis not present

## 2023-09-08 DIAGNOSIS — J189 Pneumonia, unspecified organism: Secondary | ICD-10-CM | POA: Diagnosis not present

## 2023-09-08 DIAGNOSIS — D869 Sarcoidosis, unspecified: Secondary | ICD-10-CM | POA: Diagnosis not present

## 2023-09-08 DIAGNOSIS — R945 Abnormal results of liver function studies: Secondary | ICD-10-CM

## 2023-09-08 LAB — COMPREHENSIVE METABOLIC PANEL WITH GFR
ALT: 33 U/L (ref 0–35)
AST: 35 U/L (ref 0–37)
Albumin: 4.4 g/dL (ref 3.5–5.2)
Alkaline Phosphatase: 171 U/L — ABNORMAL HIGH (ref 39–117)
BUN: 52 mg/dL — ABNORMAL HIGH (ref 6–23)
CO2: 21 meq/L (ref 19–32)
Calcium: 10.1 mg/dL (ref 8.4–10.5)
Chloride: 102 meq/L (ref 96–112)
Creatinine, Ser: 1.23 mg/dL — ABNORMAL HIGH (ref 0.40–1.20)
GFR: 41.54 mL/min — ABNORMAL LOW (ref >60.00)
Glucose, Bld: 95 mg/dL (ref 70–99)
Potassium: 4.6 meq/L (ref 3.5–5.1)
Sodium: 133 meq/L — ABNORMAL LOW (ref 135–145)
Total Bilirubin: 0.3 mg/dL (ref 0.2–1.2)
Total Protein: 8.2 g/dL (ref 6.0–8.3)

## 2023-09-08 NOTE — Patient Instructions (Addendum)
 VISIT SUMMARY:  You had a follow-up appointment to evaluate your pulmonary health after a recent hospitalization for pneumonia. We discussed your history of sarcoidosis, recent pneumonia treatment, and other health concerns including diabetes and glaucoma.  YOUR PLAN:  -SARCOIDOSIS OF EYE AND SINUSES: Sarcoidosis is a condition where inflammatory cells grow in different parts of your body, often affecting the eyes and sinuses in your case. You should continue using prednisone eye drops for your eyes and nasal flushes for your sinuses as these treatments are effectively managing your symptoms.  -PNEUMONIA: Pneumonia is an infection that inflames the air sacs in one or both lungs. You were recently treated for this with antibiotics, and your symptoms have resolved. We will order a follow-up chest CT in 1-2 months to ensure the pneumonia has fully cleared and consider lung function tests to check for any potential chronic lung issues due to your smoking history.  -ABNORMAL LIVER FUNCTION TESTS: Your liver function tests were abnormal during your recent hospitalization, likely due to dehydration and infection. We will repeat these tests to monitor your liver health and decide if further evaluation is needed based on the results.   INSTRUCTIONS:  Please schedule a follow-up chest CT in 1-2 months to ensure your pneumonia has resolved. Additionally, we will repeat your liver function tests to monitor for any ongoing issues. Continue with your current treatments for sarcoidosis, glaucoma, and diabetes as discussed.

## 2023-09-08 NOTE — Progress Notes (Signed)
 Sabrina Eaton    [id number]    01-Jun-1940  Primary Care Physician:Grewal, Moira Andrews, MD  Referring Physician: Thurman Flores, MD [address] [place],  [place] [address]  Chief complaint: Consult for sarcoidosis  HPI: 83 y.o. who  has a past medical history of Arthritis, Asthma due to seasonal allergies, Cancer (HCC), DM (diabetes mellitus) (HCC), Environmental allergies, History of kidney stones, Hypertension, Right clavicle fracture, and Sarcoidosis.  Discussed the use of AI scribe software for clinical note transcription with the patient, who gave verbal consent to proceed.  History of Present Illness Sabrina Eaton is an 83 year old female with sarcoidosis who presents for pulmonary evaluation following a recent hospitalization for pneumonia. She is accompanied by her son, Allayne Arabian.   She has a history of sarcoidosis diagnosed in 1998. Available records she underwent a bronchoscopy which was nondiagnostic and then underwent mediastinoscopy which confirmed sarcoidosis.  There is no other significant lung involvement.  Sarcoidosis initially affecting her left eye and later involving her sinuses.  She was briefly on prednisone after diagnosis her sinus issues are managed with a twice-daily nasal flush using a solution containing Johnson's baby shampoo. Her eye condition is managed with prednisone eye drops, used every other day.   In 2024, she was evaluated by a pulmonologist at [facility], where a CT scan did not show any cancer or significant changes compared to previous scans. Recently, she was hospitalized for dehydration and community-acquired pneumonia, treated with ceftriaxone  and azithromycin . During this hospitalization, her liver tests were abnormal but improved with hydration. She has diabetes, which she attributes to long-term steroid use for her eye condition, and glaucoma, also linked to steroid use.  She has a significant smoking history, having smoked a  pack a day since her teens until she quit after Christmas 2024 due to breathing difficulties. She reports improvement in her breathing since quitting. She works part-time at a [employer] and previously taught pre-K for 20 years. She is a widow and lives with her son, Allayne Arabian, and three dogs. No exposure to mold, hot tubs, or feather bedding at home.  There is no family history of lung disease or sarcoidosis. She is originally from [place]  but has lived in [place]  for many years.  Pets: Dogs Occupation: Worked in [employer].  Was previously a Chartered loss adjuster Exposures: No mold, hot tub, Jacuzzi.  No feather pillows or comforter No h/o chemo/XRT/amiodarone/macrodantin/MTX  No exposure to asbestos, silica or other organic allergens  Smoking history: 60-pack-year smoker.  Quit in December 2024 Travel history: Originally from [place] .  No significant recent travel Relevant family history: No family history of lung disease   Outpatient Encounter Medications as of 09/08/2023  Medication Sig   acetaminophen  (TYLENOL ) 500 MG tablet Take 500 mg by mouth every 6 (six) hours as needed for mild pain.   brimonidine  (ALPHAGAN ) 0.2 % ophthalmic solution Place 1 drop into the left eye in the morning and at bedtime.   celecoxib (CELEBREX) 200 MG capsule Take 1 capsule by mouth daily.   citalopram  (CELEXA ) 20 MG tablet Take 20 mg by mouth daily.   dorzolamide  (TRUSOPT ) 2 % ophthalmic solution Place 1 drop into the left eye 2 (two) times daily.   doxylamine , Sleep, (UNISOM ) 25 MG tablet Take 25 mg by mouth at bedtime.   ezetimibe -simvastatin  (VYTORIN ) 10-40 MG tablet Take 1 tablet by mouth daily.   metFORMIN (GLUCOPHAGE) 500 MG tablet Take 500 mg by mouth  in the morning, at noon, and at bedtime.   metoprolol  tartrate (LOPRESSOR ) 50 MG tablet Take 50 mg by mouth daily.   Multiple Vitamins-Minerals (CENTRUM VITAMINTS PO) Take 1 tablet by mouth daily.   niacin  (NIASPAN ) 1000 MG CR tablet Take  1,000 mg by mouth daily.   Omega-3 Fatty Acids (FISH OIL) 1000 MG CAPS Take 1 capsule by mouth in the morning, at noon, and at bedtime.   Polyethyl Glycol-Propyl Glycol (SYSTANE HYDRATION PF OP) Place 4 drops into both eyes every evening.   prednisoLONE  acetate (PRED FORTE ) 1 % ophthalmic suspension INSTILL 1 DROP IN THE LEFT EYE EVERY OTHER DAY   timolol  (BETIMOL ) 0.5 % ophthalmic solution Place 1 drop into both eyes 2 (two) times daily.   hydrALAZINE  (APRESOLINE ) 25 MG tablet Take 1 tablet (25 mg total) by mouth 3 (three) times daily. Hold for sbp <130   mupirocin ointment (BACTROBAN) 2 % 1 Application at bedtime. (Patient not taking: Reported on 09/08/2023)   No facility-administered encounter medications on file as of 09/08/2023.    Allergies as of 09/08/2023 - Review Complete 09/08/2023  Allergen Reaction Noted   Dorzolamide  Itching 07/01/2022    Past Medical History:  Diagnosis Date   Arthritis    Asthma due to seasonal allergies    Cancer (HCC)    skin cancer - face - basal   DM (diabetes mellitus) (HCC)    Environmental allergies    History of kidney stones    1 small in each kidney - has had for years   Hypertension    Right clavicle fracture    Sarcoidosis    effecting the eyes    Past Surgical History:  Procedure Laterality Date   BRONCHOSCOPY      x 2 to determine if she has scarcoidosis   COLONOSCOPY W/ POLYPECTOMY     ORIF CLAVICULAR FRACTURE Right 07/12/2019   Procedure: OPEN REDUCTION INTERNAL FIXATION (ORIF) CLAVICULAR FRACTURE;  Surgeon: Janeth Medicus, MD;  Location: [facility] OR;  Service: Orthopedics;  Laterality: Right;    No family history on file.  Social History   Socioeconomic History   Marital status: Widowed    Spouse name: Not on file   Number of children: Not on file   Years of education: Not on file   Highest education level: Not on file  Occupational History   Not on file  Tobacco Use   Smoking status: Former    Current packs/day:  1.00    Types: Cigarettes   Smokeless tobacco: Never   Tobacco comments:    Quit smoking in December 2024  Vaping Use   Vaping status: Never Used  Substance and Sexual Activity   Alcohol  use: Not Currently   Drug use: Not Currently   Sexual activity: Not on file  Other Topics Concern   Not on file  Social History Narrative   Not on file   Social Drivers of Health   Financial Resource Strain: Not on file  Food Insecurity: No Food Insecurity (08/02/2023)   Hunger Vital Sign    Worried About Running Out of Food in the Last Year: Never true    Ran Out of Food in the Last Year: Never true  Transportation Needs: No Transportation Needs (08/02/2023)   PRAPARE - Administrator, Civil Service (Medical): No    Lack of Transportation (Non-Medical): No  Physical Activity: Not on file  Stress: Not on file  Social Connections: Socially Isolated (08/02/2023)   Social Connection  and Isolation Panel [NHANES]    Frequency of Communication with Friends and Family: Once a week    Frequency of Social Gatherings with Friends and Family: Once a week    Attends Religious Services: Never    Database administrator or Organizations: No    Attends Banker Meetings: Never    Marital Status: Widowed  Intimate Partner Violence: Not At Risk (08/02/2023)   Humiliation, Afraid, Rape, and Kick questionnaire    Fear of Current or Ex-Partner: No    Emotionally Abused: No    Physically Abused: No    Sexually Abused: No    Review of systems: Review of Systems  Constitutional: Negative for fever and chills.  HENT: Negative.   Eyes: Negative for blurred vision.  Respiratory: as per HPI  Cardiovascular: Negative for chest pain and palpitations.  Gastrointestinal: Negative for vomiting, diarrhea, blood per rectum. Genitourinary: Negative for dysuria, urgency, frequency and hematuria.  Musculoskeletal: Negative for myalgias, back pain and joint pain.  Skin: Negative for itching and rash.   Neurological: Negative for dizziness, tremors, focal weakness, seizures and loss of consciousness.  Endo/Heme/Allergies: Negative for environmental allergies.  Psychiatric/Behavioral: Negative for depression, suicidal ideas and hallucinations.  All other systems reviewed and are negative.  Physical Exam: Blood pressure (!) 148/77, pulse 71, height 5' (1.524 m), weight 97 lb 9.6 oz (44.3 kg), SpO2 97%. Gen:      No acute distress HEENT:  EOMI, sclera anicteric Neck:     No masses; no thyromegaly Lungs:    Clear to auscultation bilaterally; normal respiratory effort CV:         Regular rate and rhythm; no murmurs Abd:      + bowel sounds; soft, non-tender; no palpable masses, no distension Ext:    No edema; adequate peripheral perfusion Skin:      Warm and dry; no rash Neuro: alert and oriented x 3 Psych: normal mood and affect  Data Reviewed: Imaging: CT chest 08/02/2023-scattered groundglass opacities bilaterally, patchy airspace disease in the lower lobes.  3 mm left upper lobe nodule.  4 cm dilatation of ascending aorta. I reviewed the images personally.  PFTs:  Labs:  Assessment & Plan Sarcoidosis of eye and sinuses Sarcoidosis initially diagnosed in 1998 in the left eye, with subsequent sinus involvement. No pulmonary involvement on recent CT. Managed with prednisone eye drops, effectively controlling ocular symptoms. Chronic sinusitis managed with nasal flushes and topical treatments. No systemic steroid use. Unlikely future lung involvement given 30-year history without pulmonary symptoms. - Continue prednisone eye drops for ocular sarcoidosis - Continue nasal flushes and topical treatments for sinus involvement - Follow-up high-res CT and PFTs to get baseline lung evaluation  Pneumonia Recent hospitalization for community-acquired pneumonia with mild presentation, treated with ceftriaxone  and azithromycin . CT scan showed mild pneumonia without cancer or significant lung  involvement. Symptoms resolved post-treatment. Future lung evaluation planned due to smoking history and potential COPD risk. - Order follow-up chest CT in 1-2 months to ensure resolution of pneumonia - Lung function tests to evaluate for potential COPD due to smoking history  Abnormal liver function tests Abnormal liver function tests during recent hospitalization, likely due to dehydration and infection. Improvement with rehydration. No active sarcoidosis liver involvement suspected. Further evaluation dependent on repeat test results. - Order repeat liver function tests to monitor improvement - Consider further liver evaluation for sarcoidosis involvement if liver function tests remain abnormal   Recommendations: High-res CT, PFTs Check liver tests to ensure improvement  Phyllis Breeze MD [facility] 09/08/2023, 11:06 AM  CC: Thurman Flores, MD

## 2023-09-17 ENCOUNTER — Inpatient Hospital Stay (HOSPITAL_BASED_OUTPATIENT_CLINIC_OR_DEPARTMENT_OTHER)
Admission: EM | Admit: 2023-09-17 | Discharge: 2023-09-20 | DRG: 392 | Disposition: A | Discharge status: Home / Self Care | Attending: Family Medicine | Admitting: Family Medicine

## 2023-09-17 ENCOUNTER — Encounter (HOSPITAL_BASED_OUTPATIENT_CLINIC_OR_DEPARTMENT_OTHER): Payer: Self-pay

## 2023-09-17 ENCOUNTER — Emergency Department (HOSPITAL_BASED_OUTPATIENT_CLINIC_OR_DEPARTMENT_OTHER)

## 2023-09-17 ENCOUNTER — Other Ambulatory Visit: Payer: Self-pay

## 2023-09-17 DIAGNOSIS — K529 Noninfective gastroenteritis and colitis, unspecified: Principal | ICD-10-CM

## 2023-09-17 DIAGNOSIS — H401111 Primary open-angle glaucoma, right eye, mild stage: Secondary | ICD-10-CM | POA: Diagnosis present

## 2023-09-17 DIAGNOSIS — Z85828 Personal history of other malignant neoplasm of skin: Secondary | ICD-10-CM

## 2023-09-17 DIAGNOSIS — J45909 Unspecified asthma, uncomplicated: Secondary | ICD-10-CM | POA: Diagnosis present

## 2023-09-17 DIAGNOSIS — N200 Calculus of kidney: Secondary | ICD-10-CM | POA: Diagnosis present

## 2023-09-17 DIAGNOSIS — N179 Acute kidney failure, unspecified: Principal | ICD-10-CM

## 2023-09-17 DIAGNOSIS — E876 Hypokalemia: Secondary | ICD-10-CM | POA: Diagnosis not present

## 2023-09-17 DIAGNOSIS — E860 Dehydration: Secondary | ICD-10-CM | POA: Diagnosis not present

## 2023-09-17 DIAGNOSIS — Z87891 Personal history of nicotine dependence: Secondary | ICD-10-CM | POA: Diagnosis not present

## 2023-09-17 DIAGNOSIS — D869 Sarcoidosis, unspecified: Secondary | ICD-10-CM | POA: Diagnosis not present

## 2023-09-17 DIAGNOSIS — Z888 Allergy status to other drugs, medicaments and biological substances status: Secondary | ICD-10-CM

## 2023-09-17 DIAGNOSIS — E785 Hyperlipidemia, unspecified: Secondary | ICD-10-CM | POA: Diagnosis not present

## 2023-09-17 DIAGNOSIS — E872 Acidosis, unspecified: Secondary | ICD-10-CM | POA: Diagnosis present

## 2023-09-17 DIAGNOSIS — Z681 Body mass index (BMI) 19 or less, adult: Secondary | ICD-10-CM

## 2023-09-17 DIAGNOSIS — I10 Essential (primary) hypertension: Secondary | ICD-10-CM | POA: Diagnosis not present

## 2023-09-17 DIAGNOSIS — Z79899 Other long term (current) drug therapy: Secondary | ICD-10-CM

## 2023-09-17 DIAGNOSIS — H401123 Primary open-angle glaucoma, left eye, severe stage: Secondary | ICD-10-CM | POA: Diagnosis present

## 2023-09-17 DIAGNOSIS — Z791 Long term (current) use of non-steroidal anti-inflammatories (NSAID): Secondary | ICD-10-CM | POA: Diagnosis not present

## 2023-09-17 DIAGNOSIS — E871 Hypo-osmolality and hyponatremia: Secondary | ICD-10-CM | POA: Diagnosis present

## 2023-09-17 DIAGNOSIS — E1165 Type 2 diabetes mellitus with hyperglycemia: Secondary | ICD-10-CM | POA: Diagnosis not present

## 2023-09-17 DIAGNOSIS — Z7984 Long term (current) use of oral hypoglycemic drugs: Secondary | ICD-10-CM | POA: Diagnosis not present

## 2023-09-17 DIAGNOSIS — R392 Extrarenal uremia: Secondary | ICD-10-CM | POA: Diagnosis not present

## 2023-09-17 DIAGNOSIS — N132 Hydronephrosis with renal and ureteral calculous obstruction: Secondary | ICD-10-CM | POA: Diagnosis not present

## 2023-09-17 DIAGNOSIS — N19 Unspecified kidney failure: Secondary | ICD-10-CM

## 2023-09-17 DIAGNOSIS — E440 Moderate protein-calorie malnutrition: Secondary | ICD-10-CM | POA: Diagnosis not present

## 2023-09-17 DIAGNOSIS — R9431 Abnormal electrocardiogram [ECG] [EKG]: Secondary | ICD-10-CM | POA: Diagnosis not present

## 2023-09-17 DIAGNOSIS — D8683 Sarcoid iridocyclitis: Secondary | ICD-10-CM | POA: Diagnosis present

## 2023-09-17 DIAGNOSIS — R6510 Systemic inflammatory response syndrome (SIRS) of non-infectious origin without acute organ dysfunction: Principal | ICD-10-CM | POA: Diagnosis present

## 2023-09-17 DIAGNOSIS — E119 Type 2 diabetes mellitus without complications: Secondary | ICD-10-CM

## 2023-09-17 DIAGNOSIS — L8990 Pressure ulcer of unspecified site, unspecified stage: Secondary | ICD-10-CM | POA: Diagnosis not present

## 2023-09-17 DIAGNOSIS — R109 Unspecified abdominal pain: Secondary | ICD-10-CM | POA: Diagnosis not present

## 2023-09-17 DIAGNOSIS — F32A Depression, unspecified: Secondary | ICD-10-CM | POA: Diagnosis present

## 2023-09-17 LAB — BASIC METABOLIC PANEL WITH GFR
Anion gap: 19 — ABNORMAL HIGH (ref 5–15)
BUN: 169 mg/dL — ABNORMAL HIGH (ref 8–23)
CO2: 14 mmol/L — ABNORMAL LOW (ref 22–32)
Calcium: 10 mg/dL (ref 8.9–10.3)
Chloride: 101 mmol/L (ref 98–111)
Creatinine, Ser: 5.02 mg/dL — ABNORMAL HIGH (ref 0.44–1.00)
GFR, Estimated: 8 mL/min — ABNORMAL LOW (ref >60)
Glucose, Bld: 235 mg/dL — ABNORMAL HIGH (ref 70–99)
Potassium: 4.3 mmol/L (ref 3.5–5.1)
Sodium: 134 mmol/L — ABNORMAL LOW (ref 135–145)

## 2023-09-17 LAB — URINALYSIS, ROUTINE W REFLEX MICROSCOPIC
Bacteria, UA: NONE SEEN
Bilirubin Urine: NEGATIVE
Glucose, UA: NEGATIVE mg/dL
Hgb urine dipstick: NEGATIVE
Ketones, ur: NEGATIVE mg/dL
Leukocytes,Ua: NEGATIVE
Nitrite: NEGATIVE
Specific Gravity, Urine: 1.019 (ref 1.005–1.030)
pH: 5.5 (ref 5.0–8.0)

## 2023-09-17 LAB — CBC
HCT: 37.7 % (ref 36.0–46.0)
Hemoglobin: 12.1 g/dL (ref 12.0–15.0)
MCH: 31.6 pg (ref 26.0–34.0)
MCHC: 32.1 g/dL (ref 30.0–36.0)
MCV: 98.4 fL (ref 80.0–100.0)
Platelets: 296 10*3/uL (ref 150–400)
RBC: 3.83 MIL/uL — ABNORMAL LOW (ref 3.87–5.11)
RDW: 15.2 % (ref 11.5–15.5)
WBC: 29 10*3/uL — ABNORMAL HIGH (ref 4.0–10.5)
nRBC: 0 % (ref 0.0–0.2)

## 2023-09-17 LAB — COMPREHENSIVE METABOLIC PANEL WITH GFR
ALT: 26 U/L (ref 0–44)
AST: 28 U/L (ref 15–41)
Albumin: 3.7 g/dL (ref 3.5–5.0)
Alkaline Phosphatase: 120 U/L (ref 38–126)
Anion gap: 23 — ABNORMAL HIGH (ref 5–15)
BUN: 178 mg/dL — ABNORMAL HIGH (ref 8–23)
CO2: 12 mmol/L — ABNORMAL LOW (ref 22–32)
Calcium: 10.3 mg/dL (ref 8.9–10.3)
Chloride: 97 mmol/L — ABNORMAL LOW (ref 98–111)
Creatinine, Ser: 5.7 mg/dL — ABNORMAL HIGH (ref 0.44–1.00)
GFR, Estimated: 7 mL/min — ABNORMAL LOW (ref >60)
Glucose, Bld: 293 mg/dL — ABNORMAL HIGH (ref 70–99)
Potassium: 4.7 mmol/L (ref 3.5–5.1)
Sodium: 131 mmol/L — ABNORMAL LOW (ref 135–145)
Total Bilirubin: <0.2 mg/dL (ref 0.0–1.2)
Total Protein: 7 g/dL (ref 6.5–8.1)

## 2023-09-17 LAB — GLUCOSE, CAPILLARY
Glucose-Capillary: 185 mg/dL — ABNORMAL HIGH (ref 70–99)
Glucose-Capillary: 235 mg/dL — ABNORMAL HIGH (ref 70–99)
Glucose-Capillary: 213 mg/dL — ABNORMAL HIGH (ref 70–99)

## 2023-09-17 LAB — LACTIC ACID, PLASMA
Lactic Acid, Venous: 2.2 mmol/L (ref 0.5–1.9)
Lactic Acid, Venous: 2.2 mmol/L (ref 0.5–1.9)
Lactic Acid, Venous: 1.5 mmol/L (ref 0.5–1.9)

## 2023-09-17 LAB — CBG MONITORING, ED
Glucose-Capillary: 296 mg/dL — ABNORMAL HIGH (ref 70–99)
Glucose-Capillary: 227 mg/dL — ABNORMAL HIGH (ref 70–99)

## 2023-09-17 LAB — LIPASE, BLOOD: Lipase: 185 U/L — ABNORMAL HIGH (ref 11–51)

## 2023-09-17 LAB — C DIFFICILE QUICK SCREEN W PCR REFLEX
C Diff antigen: NEGATIVE
C Diff interpretation: NOT DETECTED
C Diff toxin: NEGATIVE

## 2023-09-17 LAB — BETA-HYDROXYBUTYRIC ACID: Beta-Hydroxybutyric Acid: 0.28 mmol/L — ABNORMAL HIGH (ref 0.05–0.27)

## 2023-09-17 MED ORDER — HEPARIN SODIUM (PORCINE) 5000 UNIT/ML IJ SOLN
5000.0000 [IU] | Freq: Three times a day (TID) | INTRAMUSCULAR | Status: DC
  Administered 2023-09-17 – 2023-09-20 (×9): 5000 [IU] via SUBCUTANEOUS
  Filled 2023-09-17 (×9): qty 1

## 2023-09-17 MED ORDER — GLUCERNA SHAKE PO LIQD
237.0000 mL | Freq: Three times a day (TID) | ORAL | Status: DC
  Administered 2023-09-18 – 2023-09-20 (×8): 237 mL via ORAL
  Filled 2023-09-17 (×10): qty 237

## 2023-09-17 MED ORDER — METRONIDAZOLE 500 MG/100ML IV SOLN
500.0000 mg | Freq: Once | INTRAVENOUS | Status: AC
  Administered 2023-09-17: 500 mg via INTRAVENOUS
  Filled 2023-09-17: qty 100

## 2023-09-17 MED ORDER — ACETAMINOPHEN 325 MG PO TABS
650.0000 mg | ORAL_TABLET | Freq: Four times a day (QID) | ORAL | Status: DC | PRN
  Administered 2023-09-17 – 2023-09-20 (×6): 650 mg via ORAL
  Filled 2023-09-17 (×6): qty 2

## 2023-09-17 MED ORDER — LACTATED RINGERS IV BOLUS
30.0000 mL/kg | Freq: Once | INTRAVENOUS | Status: AC
  Administered 2023-09-17: 1329 mL via INTRAVENOUS

## 2023-09-17 MED ORDER — CITALOPRAM HYDROBROMIDE 20 MG PO TABS
20.0000 mg | ORAL_TABLET | Freq: Every day | ORAL | Status: DC
  Administered 2023-09-18 – 2023-09-20 (×3): 20 mg via ORAL
  Filled 2023-09-17 (×3): qty 1

## 2023-09-17 MED ORDER — METRONIDAZOLE 500 MG/100ML IV SOLN
500.0000 mg | Freq: Two times a day (BID) | INTRAVENOUS | Status: DC
  Administered 2023-09-17 – 2023-09-20 (×6): 500 mg via INTRAVENOUS
  Filled 2023-09-17 (×6): qty 100

## 2023-09-17 MED ORDER — SODIUM CHLORIDE 0.9 % IV BOLUS: 1000.0000 mL | Freq: Once | INTRAVENOUS | Status: DC

## 2023-09-17 MED ORDER — TIMOLOL MALEATE 0.5 % OP SOLN
1.0000 [drp] | Freq: Two times a day (BID) | OPHTHALMIC | Status: DC
  Administered 2023-09-17 – 2023-09-20 (×6): 1 [drp] via OPHTHALMIC
  Filled 2023-09-17: qty 5

## 2023-09-17 MED ORDER — EZETIMIBE-SIMVASTATIN 10-40 MG PO TABS: 1.0000 | ORAL_TABLET | Freq: Every day | ORAL | Status: DC

## 2023-09-17 MED ORDER — ACETAMINOPHEN 650 MG RE SUPP: 650.0000 mg | Freq: Four times a day (QID) | RECTAL | Status: DC | PRN

## 2023-09-17 MED ORDER — LACTATED RINGERS IV SOLN: INTRAVENOUS | Status: DC

## 2023-09-17 MED ORDER — EZETIMIBE 10 MG PO TABS
10.0000 mg | ORAL_TABLET | Freq: Every day | ORAL | Status: DC
  Administered 2023-09-18 – 2023-09-20 (×3): 10 mg via ORAL
  Filled 2023-09-17 (×3): qty 1

## 2023-09-17 MED ORDER — LACTATED RINGERS IV SOLN: INTRAVENOUS | Status: AC

## 2023-09-17 MED ORDER — INSULIN ASPART 100 UNIT/ML IJ SOLN
0.0000 [IU] | Freq: Three times a day (TID) | INTRAMUSCULAR | Status: DC
  Administered 2023-09-17 – 2023-09-18 (×2): 3 [IU] via SUBCUTANEOUS
  Administered 2023-09-18 (×2): 5 [IU] via SUBCUTANEOUS
  Administered 2023-09-19: 2 [IU] via SUBCUTANEOUS
  Administered 2023-09-19 (×2): 3 [IU] via SUBCUTANEOUS
  Administered 2023-09-20: 2 [IU] via SUBCUTANEOUS
  Administered 2023-09-20: 5 [IU] via SUBCUTANEOUS

## 2023-09-17 MED ORDER — PREDNISOLONE ACETATE 1 % OP SUSP
1.0000 [drp] | OPHTHALMIC | Status: DC
  Administered 2023-09-19: 1 [drp] via OPHTHALMIC
  Filled 2023-09-17: qty 5

## 2023-09-17 MED ORDER — DORZOLAMIDE HCL 2 % OP SOLN
1.0000 [drp] | Freq: Two times a day (BID) | OPHTHALMIC | Status: DC
  Administered 2023-09-17 – 2023-09-20 (×6): 1 [drp] via OPHTHALMIC
  Filled 2023-09-17: qty 10

## 2023-09-17 MED ORDER — ONDANSETRON HCL 4 MG/2ML IJ SOLN: 4.0000 mg | Freq: Four times a day (QID) | INTRAMUSCULAR | Status: DC | PRN

## 2023-09-17 MED ORDER — METOPROLOL TARTRATE 50 MG PO TABS
50.0000 mg | ORAL_TABLET | Freq: Every day | ORAL | Status: DC
Start: 2023-09-18 — End: 2023-09-20
  Administered 2023-09-18 – 2023-09-20 (×3): 50 mg via ORAL
  Filled 2023-09-17 (×3): qty 1

## 2023-09-17 MED ORDER — LACTATED RINGERS IV BOLUS
1000.0000 mL | Freq: Once | INTRAVENOUS | Status: AC
  Administered 2023-09-17: 1 mL via INTRAVENOUS

## 2023-09-17 MED ORDER — SODIUM CHLORIDE 0.9 % IV SOLN
2.0000 g | Freq: Once | INTRAVENOUS | Status: AC
  Administered 2023-09-17: 2 g via INTRAVENOUS
  Filled 2023-09-17: qty 20

## 2023-09-17 MED ORDER — SODIUM CHLORIDE 0.9 % IV SOLN
2.0000 g | INTRAVENOUS | Status: DC
  Administered 2023-09-18 – 2023-09-20 (×3): 2 g via INTRAVENOUS
  Filled 2023-09-17 (×3): qty 20

## 2023-09-17 MED ORDER — SIMVASTATIN 40 MG PO TABS
40.0000 mg | ORAL_TABLET | Freq: Every morning | ORAL | Status: DC
Start: 2023-09-18 — End: 2023-09-19
  Administered 2023-09-18 – 2023-09-19 (×2): 40 mg via ORAL
  Filled 2023-09-17 (×2): qty 1

## 2023-09-17 MED ORDER — INSULIN ASPART 100 UNIT/ML IJ SOLN: 6.0000 [IU] | Freq: Once | INTRAMUSCULAR | Status: DC

## 2023-09-17 MED ORDER — POLYVINYL ALCOHOL 1.4 % OP SOLN
4.0000 [drp] | Freq: Every day | OPHTHALMIC | Status: DC
  Administered 2023-09-17 – 2023-09-19 (×3): 4 [drp] via OPHTHALMIC
  Filled 2023-09-17: qty 15

## 2023-09-17 NOTE — Hospital Course (Addendum)
 Sabrina Eaton is a 83 year old female with extensive history of functional diarrhea, DM 2, gastritis, sarcoidosis, bilateral glaucoma HTN, HLD, seasonal allergies, asthma, chronic nonobstructive nephrolithiasis.  Presented with multiple episodes of diarrhea, generalized weaknesses, took undescribed antibiotics.  Also noted for persistent high blood sugars of 300. Presented today with worsening symptoms of diarrhea - nonbloody, multiple episodes  Recent hospitalization for same complaint and discharged on 08/04/2023. At the time C. difficile was negative,   ED evaluation/course:  Blood pressure (!) 119/59, pulse 70, temperature 97.7 F (36.5 C), RR15, height 5' (1.524 m), weight 44.3 kg, SpO2 100%.  LABs, CBC: WBC 29.0, lactic acid 2.2, CMP: Sodium 131, chloride 97, bicarb 12, BUN 178, creatinine 5.70, anion gap 23, lipase 185, Pending stool for C. difficile, blood cultures obtained, CT abdomen pelvis reviewed, nonobstructive cholelithiasis otherwise negative for any other abnormalities  CT IMPRESSION: 1. No acute inflammatory process identified within the abdomen or pelvis. 2. Multiple bilateral nonobstructing renal calculi, as described above. No hydroureteronephrosis or ureterolithiasis on either side. 3. Multiple other nonacute observations, as described above.  Requested patient to admitted for  - Metabolic acidosis  - Acute on chronic diarrhea,  - Severe dehydration, leukocytosis,  - Lactic acidosis,  - Mild hyponatremia  - Acute renal failure    Patient initially met sepsis criteria in ED, IV fluid resuscitation for sepsis and empiric antibiotics Rocephin  and azithromycin  was given.  Instructed insulin  coverage for hyperglycemia, continue IV fluid hydration, repeating labs including BMP, lactic acid, to obtaining UA  Patient will be excepted for admission either at [facility]   ______________________________________  T2DM

## 2023-09-17 NOTE — ED Notes (Signed)
 [facility] called for transport

## 2023-09-17 NOTE — Sepsis Progress Note (Signed)
 eLink is following this Code Sepsis.

## 2023-09-17 NOTE — Progress Notes (Addendum)
 PROGRESS NOTE    Patient: Sabrina Eaton                            PCP: Thurman Flores, MD                    DOB: Aug 11, 1940            DOA: 09/17/2023 ZOX:[id number]             DOS: 09/17/2023, 2:42 PM   LOS: 0 days   Date of Service: The patient was seen and examined on 09/17/2023   Brief Narrative:   Sabrina Eaton is a 83 year old female with extensive history of functional diarrhea, DM 2, gastritis, sarcoidosis, bilateral glaucoma HTN, HLD, seasonal allergies, asthma, chronic nonobstructive nephrolithiasis.  Presented with multiple episodes of diarrhea, generalized weaknesses, took undescribed antibiotics.  Also noted for persistent high blood sugars of 300. Presented today with worsening symptoms of diarrhea - nonbloody, multiple episodes  Recent hospitalization for same complaint and discharged on 08/04/2023. At the time C. difficile was negative,   ED evaluation/course:  Blood pressure (!) 119/59, pulse 70, temperature 97.7 F (36.5 C), RR15, height 5' (1.524 m), weight 44.3 kg, SpO2 100%.  LABs, CBC: WBC 29.0, lactic acid 2.2, CMP: Sodium 131, chloride 97, bicarb 12, BUN 178, creatinine 5.70, anion gap 23, lipase 185, Pending stool for C. difficile, blood cultures obtained, CT abdomen pelvis reviewed, nonobstructive cholelithiasis otherwise negative for any other abnormalities  CT IMPRESSION: 1. No acute inflammatory process identified within the abdomen or pelvis. 2. Multiple bilateral nonobstructing renal calculi, as described above. No hydroureteronephrosis or ureterolithiasis on either side. 3. Multiple other nonacute observations, as described above.  Requested patient to admitted for  - Metabolic acidosis  - Acute on chronic diarrhea,  - Severe dehydration, leukocytosis,  - Lactic acidosis,  - Mild hyponatremia  - Acute renal failure    Patient initially met sepsis criteria in ED, IV fluid resuscitation for sepsis and empiric antibiotics Rocephin  and  azithromycin  was given.  Instructed insulin  coverage for hyperglycemia, continue IV fluid hydration, repeating labs including BMP, lactic acid, to obtaining UA   Patient will be excepted for admission either at [facility]   Blood Cultures were obtained, stool pending for C. Difficile.         Procedures:   No admission procedures for hospital encounter.   Antimicrobials:  Anti-infectives (From admission, onward)    Start     Dose/Rate Route Frequency Ordered Stop   09/17/23 1300  cefTRIAXone  (ROCEPHIN ) 2 g in sodium chloride  0.9 % 100 mL IVPB        2 g 200 mL/hr over 30 Minutes Intravenous  Once 09/17/23 1258 09/17/23 1347   09/17/23 1300  metroNIDAZOLE (FLAGYL) IVPB 500 mg        500 mg 100 mL/hr over 60 Minutes Intravenous  Once 09/17/23 1258          Medication:   insulin  aspart  0-15 Units Subcutaneous TID WC       Objective:   Vitals:   09/17/23 1300 09/17/23 1355 09/17/23 1400 09/17/23 1415  BP: (!) 130/59 (!) 119/59 (!) 119/51 (!) 125/57  Pulse: 68 70 69 70  Resp: 18 15 15 16   Temp:      TempSrc:      SpO2: 100% 100% 100% 100%  Weight:      Height:        Intake/Output  Summary (Last 24 hours) at 09/17/2023 1442 Last data filed at 09/17/2023 1347 Gross per 24 hour  Intake 100.75 ml  Output --  Net 100.75 ml   Filed Weights   09/17/23 1145  Weight: 44.3 kg      LABs:     Latest Ref Rng & Units 09/17/2023   11:52 AM 08/04/2023    4:08 AM 08/03/2023    3:46 AM  CBC  WBC 4.0 - 10.5 K/uL 29.0  15.5  18.3   Hemoglobin 12.0 - 15.0 g/dL 16.1  09.6  04.5   Hematocrit 36.0 - 46.0 % 37.7  38.7  37.5   Platelets 150 - 400 K/uL 296  223  212       Latest Ref Rng & Units 09/17/2023   11:52 AM 09/08/2023   11:20 AM 08/04/2023    4:08 AM  CMP  Glucose 70 - 99 mg/dL 409  95  811   BUN 8 - 23 mg/dL 914  52  20   Creatinine 0.44 - 1.00 mg/dL 7.82  9.56  2.13   Sodium 135 - 145 mmol/L 131  133  141   Potassium 3.5 - 5.1 mmol/L 4.7  4.6  3.7   Chloride  98 - 111 mmol/L 97  102  111   CO2 22 - 32 mmol/L 12  21  21    Calcium 8.9 - 10.3 mg/dL 08.6  57.8  9.0   Total Protein 6.5 - 8.1 g/dL 7.0  8.2    Total Bilirubin 0.0 - 1.2 mg/dL <4.6  0.3    Alkaline Phos 38 - 126 U/L 120  171    AST 15 - 41 U/L 28  35    ALT 0 - 44 U/L 26  33         Micro Results No results found for this or any previous visit (from the past 240 hours).  Radiology Reports CT ABDOMEN PELVIS WO CONTRAST Result Date: 09/17/2023 CLINICAL DATA:  Abdominal pain, acute, nonlocalized. EXAM: CT ABDOMEN AND PELVIS WITHOUT CONTRAST TECHNIQUE: Multidetector CT imaging of the abdomen and pelvis was performed following the standard protocol without IV contrast. RADIATION DOSE REDUCTION: This exam was performed according to the departmental dose-optimization program which includes automated exposure control, adjustment of the mA and/or kV according to patient size and/or use of iterative reconstruction technique. COMPARISON:  CT scan abdomen and pelvis from 08/02/2023. FINDINGS: Lower chest: There are areas of atelectasis/scarring and random nodules in the right lung lower lobe, which are nonspecific and may represent sequela of prior infection or inflammation. The lung bases are otherwise clear. No pleural effusion. The heart is normal in size. No pericardial effusion. There are coronary artery atherosclerotic calcifications, in keeping with coronary artery disease. Hepatobiliary: The liver is normal in size. Non-cirrhotic configuration. No suspicious mass. No intrahepatic or extrahepatic bile duct dilation. No calcified gallstones. Normal gallbladder wall thickness. No pericholecystic inflammatory changes. Pancreas: Evaluation of pancreas is markedly limited due to lack of oral and intravenous contrast and paucity of visceral fat. Spleen: Within normal limits. No focal lesion. Adrenals/Urinary Tract: Redemonstration of low-attenuation thickening of bilateral adrenal glands without  significant interval change. No suspicious nodule. Note is made of malascended right kidney. No suspicious renal mass within the limitations of this unenhanced exam. There are at least five, 4 mm or smaller nonobstructing calculi in the left kidney and at least six, 3 mm or smaller nonobstructing calculi in the right kidney. No hydroureteronephrosis or ureterolithiasis on either  side. Urinary bladder is under distended, precluding optimal assessment. However, no large mass or stones identified. No perivesical fat stranding. Stomach/Bowel: No disproportionate dilation of the small or large bowel loops. No evidence of abnormal bowel wall thickening or inflammatory changes. The appendix is unremarkable. Vascular/Lymphatic: No ascites or pneumoperitoneum. No abdominal or pelvic lymphadenopathy, by size criteria. No aneurysmal dilation of the major abdominal arteries. There are mild peripheral atherosclerotic vascular calcifications of the aorta and its major branches. Reproductive: Not well evaluated on this exam. Normal uterus is not distinctly visualized. Other: The visualized soft tissues and abdominal wall are unremarkable. Musculoskeletal: No suspicious osseous lesions. There are mild multilevel degenerative changes in the visualized spine. IMPRESSION: 1. No acute inflammatory process identified within the abdomen or pelvis. 2. Multiple bilateral nonobstructing renal calculi, as described above. No hydroureteronephrosis or ureterolithiasis on either side. 3. Multiple other nonacute observations, as described above. Aortic Atherosclerosis (ICD10-I70.0). Electronically Signed   By: Beula Brunswick M.D.   On: 09/17/2023 13:30    SIGNED: Bobbetta Burnet, MD, FHM. FAAFP. Arlin Benes - [facility] Time spent - 35 min.  In seeing, evaluating and examining the patient. Reviewing medical records, labs, drawn plan of care. [facility],  Pager (please use [email or web address] to page/ text) Please use Epic Secure  Chat for non-urgent communication (7AM-7PM)  If 7PM-7AM, please contact night-coverage [email or web address], 09/17/2023, 2:42 PM

## 2023-09-17 NOTE — ED Provider Notes (Signed)
 [place] EMERGENCY DEPARTMENT AT [facility] Provider Note   CSN: [id number] Arrival date & time: 09/17/23  1131     History  Chief Complaint  Patient presents with   Diarrhea   Hypotension   Hyperglycemia    Sabrina Eaton is a 83 y.o. female with past medical history significant for diabetes, gastroenteritis, sarcoidosis who presents with concern for acute on chronic diarrhea, elevated blood sugar, minimal p.o. intake, lethargy.  Due to her persistent diarrhea patient took a few doses of Bactrim which she had sitting on a shelf at home.  She was not prescribed this medication.  She reports diarrhea worse since then.  Blood sugar elevated around 300 per daughter.  Recent admission for similar symptoms, AKI.  No previous history of C. difficile.  She denies any fever, just feels generally weak.  She has had an episode of vomiting, denies significant abdominal pain at time my evaluation.   Diarrhea Hyperglycemia      Home Medications Prior to Admission medications   Medication Sig Start Date End Date Taking? Authorizing Provider  acetaminophen  (TYLENOL ) 500 MG tablet Take 500 mg by mouth every 6 (six) hours as needed for mild pain.   Yes [provider]  brimonidine  (ALPHAGAN ) 0.2 % ophthalmic solution Place 1 drop into the left eye in the morning and at bedtime.   Yes [provider]  celecoxib (CELEBREX) 200 MG capsule Take 1 capsule by mouth daily. 02/03/23  Yes [provider]  citalopram  (CELEXA ) 20 MG tablet Take 20 mg by mouth daily.   Yes [provider]  dorzolamide  (TRUSOPT ) 2 % ophthalmic solution Place 1 drop into the left eye 2 (two) times daily.   Yes [provider]  doxylamine , Sleep, (UNISOM ) 25 MG tablet Take 25 mg by mouth at bedtime.   Yes [provider]  ezetimibe -simvastatin  (VYTORIN ) 10-40 MG tablet Take 1 tablet by mouth daily.   Yes [provider]  metFORMIN (GLUCOPHAGE) 500 MG tablet  Take 500 mg by mouth in the morning, at noon, and at bedtime.   Yes [provider]  metoprolol  tartrate (LOPRESSOR ) 50 MG tablet Take 50 mg by mouth daily.   Yes [provider]  mupirocin ointment (BACTROBAN) 2 % 1 Application at bedtime.   Yes [provider]  niacin  (NIASPAN ) 1000 MG CR tablet Take 1,000 mg by mouth daily.   Yes [provider]  Omega-3 Fatty Acids (FISH OIL) 1000 MG CAPS Take 1 capsule by mouth in the morning, at noon, and at bedtime.   Yes [provider]  Polyethyl Glycol-Propyl Glycol (SYSTANE HYDRATION PF OP) Place 4 drops into both eyes every evening.   Yes [provider]  prednisoLONE  acetate (PRED FORTE ) 1 % ophthalmic suspension INSTILL 1 DROP IN THE LEFT EYE EVERY OTHER DAY 08/25/22  Yes Ronelle Coffee, MD  timolol  (BETIMOL ) 0.5 % ophthalmic solution Place 1 drop into both eyes 2 (two) times daily.   Yes [provider]      Allergies    Dorzolamide , Brimonidine , and Timolol     Review of Systems   Review of Systems  Gastrointestinal:  Positive for diarrhea.  All other systems reviewed and are negative.   Physical Exam Updated Vital Signs BP (!) 125/57   Pulse 70   Temp 97.7 F (36.5 C) (Oral)   Resp 16   Ht 5' (1.524 m)   Wt 44.3 kg   SpO2 100%   BMI 19.07 kg/m  Physical  Exam Vitals and nursing note reviewed.  Constitutional:      General: She is not in acute distress.    Appearance: Normal appearance. She is ill-appearing.  HENT:     Head: Normocephalic and atraumatic.  Eyes:     General:        Right eye: No discharge.        Left eye: No discharge.  Cardiovascular:     Rate and Rhythm: Normal rate and regular rhythm.     Heart sounds: No murmur heard.    No friction rub. No gallop.  Pulmonary:     Effort: Pulmonary effort is normal.     Breath sounds: Normal breath sounds.  Abdominal:     General: Bowel sounds are normal.     Palpations: Abdomen is soft.     Comments: No  significant tenderness to palpation throughout the abdomen, no rebound, rigidity, guarding  Skin:    General: Skin is warm and dry.     Capillary Refill: Capillary refill takes less than 2 seconds.  Neurological:     Mental Status: She is alert and oriented to person, place, and time.  Psychiatric:        Mood and Affect: Mood normal.        Behavior: Behavior normal.     ED Results / Procedures / Treatments   Labs (all labs ordered are listed, but only abnormal results are displayed) Labs Reviewed  LIPASE, BLOOD - Abnormal; Notable for the following components:      Result Value   Lipase 185 (*)    All other components within normal limits  COMPREHENSIVE METABOLIC PANEL WITH GFR - Abnormal; Notable for the following components:   Sodium 131 (*)    Chloride 97 (*)    CO2 12 (*)    Glucose, Bld 293 (*)    BUN 178 (*)    Creatinine, Ser 5.70 (*)    GFR, Estimated 7 (*)    Anion gap 23 (*)    All other components within normal limits  CBC - Abnormal; Notable for the following components:   WBC 29.0 (*)    RBC 3.83 (*)    All other components within normal limits  LACTIC ACID, PLASMA - Abnormal; Notable for the following components:   Lactic Acid, Venous 2.2 (*)    All other components within normal limits  CBG MONITORING, ED - Abnormal; Notable for the following components:   Glucose-Capillary 296 (*)    All other components within normal limits  CBG MONITORING, ED - Abnormal; Notable for the following components:   Glucose-Capillary 227 (*)    All other components within normal limits  C DIFFICILE QUICK SCREEN W PCR REFLEX    CULTURE, BLOOD (ROUTINE X 2)  CULTURE, BLOOD (ROUTINE X 2)  GASTROINTESTINAL PANEL BY PCR, STOOL (REPLACES STOOL CULTURE)  URINALYSIS, ROUTINE W REFLEX MICROSCOPIC  LACTIC ACID, PLASMA  BASIC METABOLIC PANEL WITH GFR  BETA-HYDROXYBUTYRIC ACID    EKG EKG Interpretation Date/Time:  Thursday Sep 17 2023 11:41:08 EDT Ventricular Rate:  71 PR  Interval:  126 QRS Duration:  78 QT Interval:  386 QTC Calculation: 420 R Axis:   38  Text Interpretation: Sinus rhythm Abnormal R-wave progression, early transition No acute changes No significant change since last tracing Confirmed by Deatra Face [id number]) on 09/17/2023 12:21:33 PM  Radiology CT ABDOMEN PELVIS WO CONTRAST Result Date: 09/17/2023 CLINICAL DATA:  Abdominal pain, acute, nonlocalized. EXAM: CT ABDOMEN AND PELVIS WITHOUT CONTRAST TECHNIQUE:  Multidetector CT imaging of the abdomen and pelvis was performed following the standard protocol without IV contrast. RADIATION DOSE REDUCTION: This exam was performed according to the departmental dose-optimization program which includes automated exposure control, adjustment of the mA and/or kV according to patient size and/or use of iterative reconstruction technique. COMPARISON:  CT scan abdomen and pelvis from 08/02/2023. FINDINGS: Lower chest: There are areas of atelectasis/scarring and random nodules in the right lung lower lobe, which are nonspecific and may represent sequela of prior infection or inflammation. The lung bases are otherwise clear. No pleural effusion. The heart is normal in size. No pericardial effusion. There are coronary artery atherosclerotic calcifications, in keeping with coronary artery disease. Hepatobiliary: The liver is normal in size. Non-cirrhotic configuration. No suspicious mass. No intrahepatic or extrahepatic bile duct dilation. No calcified gallstones. Normal gallbladder wall thickness. No pericholecystic inflammatory changes. Pancreas: Evaluation of pancreas is markedly limited due to lack of oral and intravenous contrast and paucity of visceral fat. Spleen: Within normal limits. No focal lesion. Adrenals/Urinary Tract: Redemonstration of low-attenuation thickening of bilateral adrenal glands without significant interval change. No suspicious nodule. Note is made of malascended right kidney. No suspicious renal  mass within the limitations of this unenhanced exam. There are at least five, 4 mm or smaller nonobstructing calculi in the left kidney and at least six, 3 mm or smaller nonobstructing calculi in the right kidney. No hydroureteronephrosis or ureterolithiasis on either side. Urinary bladder is under distended, precluding optimal assessment. However, no large mass or stones identified. No perivesical fat stranding. Stomach/Bowel: No disproportionate dilation of the small or large bowel loops. No evidence of abnormal bowel wall thickening or inflammatory changes. The appendix is unremarkable. Vascular/Lymphatic: No ascites or pneumoperitoneum. No abdominal or pelvic lymphadenopathy, by size criteria. No aneurysmal dilation of the major abdominal arteries. There are mild peripheral atherosclerotic vascular calcifications of the aorta and its major branches. Reproductive: Not well evaluated on this exam. Normal uterus is not distinctly visualized. Other: The visualized soft tissues and abdominal wall are unremarkable. Musculoskeletal: No suspicious osseous lesions. There are mild multilevel degenerative changes in the visualized spine. IMPRESSION: 1. No acute inflammatory process identified within the abdomen or pelvis. 2. Multiple bilateral nonobstructing renal calculi, as described above. No hydroureteronephrosis or ureterolithiasis on either side. 3. Multiple other nonacute observations, as described above. Aortic Atherosclerosis (ICD10-I70.0). Electronically Signed   By: Beula Brunswick M.D.   On: 09/17/2023 13:30    Procedures .Critical Care  Performed by: Nelly Banco, PA-C Authorized by: Nelly Banco, PA-C   Critical care provider statement:    Critical care time (minutes):  35   Critical care was necessary to treat or prevent imminent or life-threatening deterioration of the following conditions:  Renal failure   Critical care was time spent personally by me on the following  activities:  Development of treatment plan with patient or surrogate, discussions with consultants, evaluation of patient's response to treatment, examination of patient, ordering and review of laboratory studies, ordering and review of radiographic studies, ordering and performing treatments and interventions, pulse oximetry, re-evaluation of patient's condition and review of old charts   Care discussed with: admitting provider       Medications Ordered in ED Medications  lactated ringers  infusion (has no administration in time range)  insulin  aspart (novoLOG ) injection 0-15 Units (has no administration in time range)  cefTRIAXone  (ROCEPHIN ) 2 g in sodium chloride  0.9 % 100 mL IVPB (0 g Intravenous Stopped 09/17/23 1347)  metroNIDAZOLE (  FLAGYL) IVPB 500 mg (500 mg Intravenous New Bag/Given 09/17/23 1346)  lactated ringers  bolus 1,329 mL (1,329 mLs Intravenous New Bag/Given 09/17/23 1309)    ED Course/ Medical Decision Making/ A&P                                  Medical Decision Making Amount and/or Complexity of Data Reviewed Labs: ordered. Radiology: ordered.  Risk Prescription drug management. Decision regarding hospitalization.   This patient is a 83 y.o. female  who presents to the ED for concern of diarrhea, weakness, lethargy.   Differential diagnoses prior to evaluation: The emergent differential diagnosis includes, but is not limited to,  CVA, spinal cord injury, ACS, arrhythmia, syncope, orthostatic hypotension, sepsis, hypoglycemia, hypoxia, electrolyte disturbance, endocrine disorder, anemia, environmental exposure, polypharmacy . This is not an exhaustive differential.   Past Medical History / Co-morbidities / Social History: diabetes, gastroenteritis, sarcoidosis   Additional history: Chart reviewed. Pertinent results include: Reviewed lab work, imaging from recent previous hospitalization just around 1 month ago.  Physical Exam: Physical exam performed. The  pertinent findings include: Somewhat soft diastolic pressure around the 50s, but maps have been above 65, vital signs otherwise stable throughout emergency department evaluation, no fever.  Lab Tests/Imaging studies: I personally interpreted labs/imaging and the pertinent results include:  Severe AKI, creatinine 5.7 from baseline around 1, BUN significantly elevated at 178, suspect prerenal cause, dehydration, she is hyponatremia, sodium 131, anion gap 23, suspect uremia/lactic acidosis, her bicarb is 12.  She has severe leukocytosis, blood cells 29, her lipase is also elevated at 185 although I do not think that pancreatitis is her primary etiology of her symptoms.  I independently interpreted CT without contrast which shows no evidence of acute localized cyst to explain patient's symptoms.  I agree with the radiologist interpretation.  Cardiac monitoring: EKG obtained and interpreted by myself and attending physician which shows: Sinus rhythm no significant change from recent previous   Medications: I ordered medication including Rocephin , Flagyl, 30 cc/kg fluid bolus for suspected developing sepsis, severe dehydration, acute renal failure.  I have reviewed the patients home medicines and have made adjustments as needed.  Consults: I spoke with the hospitalist, Dr. Marden Shaggy who agrees to admission for persistent diarrhea, kidney failure   Disposition: After consideration of the diagnostic results and the patients response to treatment, I feel that patient would benefit from admission as discussed above.   I discussed this case with my attending physician who cosigned this note including patient's presenting symptoms, physical exam, and planned diagnostics and interventions. Attending physician stated agreement with plan or made changes to plan which were implemented.   Attending physician assessed patient at bedside.  Final Clinical Impression(s) / ED Diagnoses Final diagnoses:  None     Rx / DC Orders ED Discharge Orders     None         Stefan Edge 09/17/23 1452    Deatra Face, MD 09/19/23 1353

## 2023-09-17 NOTE — ED Notes (Signed)
 X2 sets of blood cultures obtained before any abx started

## 2023-09-17 NOTE — ED Notes (Signed)
 Patient transported to CT

## 2023-09-17 NOTE — H&P (Signed)
 History and Physical    Patient: Sabrina Eaton ZOX:[id number] DOB: 12-12-1940 DOA: 09/17/2023 DOS: the patient was seen and examined on 09/17/2023 PCP: Pcp, No  Patient coming from: [facility] > [facility] > [facility]  Chief Complaint: Diarrhea  HPI: Sabrina Eaton is a 83 y.o. female with medical history significant of functional diarrhea, type 2 diabetes, gastritis, sarcoidosis primarily affecting the eyes, bilateral glaucoma, hypertension, hyperlipidemia, asthma, malnutrition presenting to the ED with diarrhea for the past week.  Patient reports that she began experiencing watery diarrhea about 1 week ago and it has been persistent since that time.  States that she has been having about 6-7 loose watery stools each day.  Denies any melena or hematochezia.  She does note associated nausea and nonbilious nonbloody emesis a couple of times a day.  She also has associated generalized stomach pain that has been intermittent.  Family does report decreased oral intake chronically but this has worsened over the past week.  They have been attempting to keep her hydrated but her diarrhea has been incessant.  She denies any fevers, chills, chest pain, palpitations, shortness of breath, constipation, urinary changes.  Patient had a similar presentation about a month and a half ago where she was hospitalized with cramping stomach pain, diarrhea, poor oral intake.  CT imaging at that time did not reveal any acute intra-abdominal process.  She also had an AKI due to GI losses.  She was ultimately admitted for IV fluid hydration and her diarrhea was thought to be secondary to acute gastroenteritis.  This did ultimately resolve and she was discharged in good health.  Family does note that patient did well following discharge and it was only until about a week ago where her symptoms returned that she began to clinically deteriorate.  ED course: Vital signs stable.  CBC with leukocytosis to 29, otherwise unremarkable.  CMP  with mild hyponatremia, bicarb 12, glucose 293, creatinine 5.7 (baseline creatinine appears to be normal), BUN 178, anion gap 23.  Lipase 185.  Urinalysis negative for infection and without any ketonuria.  Lactic acid 2.2 x 2.  C. difficile quick screen negative.  Blood cultures ordered and collected.  GI panel pending.  Beta hydroxybutyric acid mildly elevated at 0.28.  Repeat BMP in the afternoon with sodium 134, bicarb 14, glucose 235, creatinine 5.02, BUN 169, anion gap 19.  CT abdomen pelvis without contrast without any acute intraabdominal abnormalities noted.  Patient given 1.3 L IV fluid bolus and started on IV fluid infusion.  Also given a dose of ceftriaxone  and metronidazole in the ED.  Transferred [facility] for further evaluation and care.  Review of Systems: As mentioned in the history of present illness. All other systems reviewed and are negative. Past Medical History:  Diagnosis Date   Arthritis    Asthma due to seasonal allergies    Cancer (HCC)    skin cancer - face - basal   DM (diabetes mellitus) (HCC)    Environmental allergies    History of kidney stones    1 small in each kidney - has had for years   Hypertension    Right clavicle fracture    Sarcoidosis    effecting the eyes   Past Surgical History:  Procedure Laterality Date   BRONCHOSCOPY      x 2 to determine if she has scarcoidosis   COLONOSCOPY W/ POLYPECTOMY     ORIF CLAVICULAR FRACTURE Right 07/12/2019   Procedure: OPEN  REDUCTION INTERNAL FIXATION (ORIF) CLAVICULAR FRACTURE;  Surgeon: Janeth Medicus, MD;  Location: [facility] OR;  Service: Orthopedics;  Laterality: Right;   Social History:  reports that she has quit smoking. Her smoking use included cigarettes. She has never used smokeless tobacco. She reports that she does not currently use alcohol . She reports that she does not currently use drugs.  Allergies  Allergen Reactions   Dorzolamide  Itching   Brimonidine  Other (See  Comments)   Timolol  Other (See Comments)    History reviewed. No pertinent family history.  Prior to Admission medications   Medication Sig Start Date End Date Taking? Authorizing Provider  acetaminophen  (TYLENOL ) 500 MG tablet Take 500 mg by mouth every 6 (six) hours as needed for mild pain.   Yes [provider]  brimonidine  (ALPHAGAN ) 0.2 % ophthalmic solution Place 1 drop into the left eye in the morning and at bedtime.   Yes [provider]  celecoxib (CELEBREX) 200 MG capsule Take 1 capsule by mouth daily. 02/03/23  Yes [provider]  citalopram  (CELEXA ) 20 MG tablet Take 20 mg by mouth daily.   Yes [provider]  dorzolamide  (TRUSOPT ) 2 % ophthalmic solution Place 1 drop into the left eye 2 (two) times daily.   Yes [provider]  doxylamine , Sleep, (UNISOM ) 25 MG tablet Take 25 mg by mouth at bedtime.   Yes [provider]  ezetimibe -simvastatin  (VYTORIN ) 10-40 MG tablet Take 1 tablet by mouth daily.   Yes [provider]  metFORMIN (GLUCOPHAGE) 500 MG tablet Take 500 mg by mouth in the morning, at noon, and at bedtime.   Yes [provider]  metoprolol  tartrate (LOPRESSOR ) 50 MG tablet Take 50 mg by mouth daily.   Yes [provider]  mupirocin ointment (BACTROBAN) 2 % 1 Application at bedtime.   Yes [provider]  niacin  (NIASPAN ) 1000 MG CR tablet Take 1,000 mg by mouth daily.   Yes [provider]  Omega-3 Fatty Acids (FISH OIL) 1000 MG CAPS Take 1 capsule by mouth in the morning, at noon, and at bedtime.   Yes [provider]  Polyethyl Glycol-Propyl Glycol (SYSTANE HYDRATION PF OP) Place 4 drops into both eyes every evening.   Yes [provider]  prednisoLONE  acetate (PRED FORTE ) 1 % ophthalmic suspension INSTILL 1 DROP IN THE LEFT EYE EVERY OTHER DAY 08/25/22  Yes Ronelle Coffee, MD  timolol  (BETIMOL ) 0.5 % ophthalmic solution Place 1 drop into both eyes 2  (two) times daily.   Yes [provider]    Physical Exam: Vitals:   09/17/23 1400 09/17/23 1415 09/17/23 1500 09/17/23 1702  BP: (!) 119/51 (!) 125/57 (!) 143/63 (!) 198/84  Pulse: 69 70 69 77  Resp: 15 16 15 19   Temp:    97.6 F (36.4 C)  TempSrc:    Oral  SpO2: 100% 100% 100% 100%  Weight:      Height:       Physical Exam Constitutional:      Comments: Chronically ill-appearing elderly female, laying in bed, NAD.  HENT:     Head: Normocephalic and atraumatic.     Mouth/Throat:     Mouth: Mucous membranes are dry.     Pharynx: Oropharynx is clear. No oropharyngeal exudate.  Eyes:     General: No scleral icterus.    Extraocular Movements: Extraocular movements intact.     Conjunctiva/sclera: Conjunctivae normal.     Pupils: Pupils are equal, round, and reactive to light.  Cardiovascular:     Rate and Rhythm: Normal rate and regular rhythm.     Heart sounds: Normal heart sounds. No murmur heard.    No friction rub. No gallop.  Pulmonary:     Effort: Pulmonary effort is normal. No respiratory distress.     Breath sounds: Normal breath sounds. No wheezing, rhonchi or rales.  Abdominal:     General: There is no distension.     Palpations: Abdomen is soft.     Tenderness: There is no abdominal tenderness. There is no guarding or rebound.     Comments: Hyperactive bowel sounds.  No abdominal tenderness to palpation on exam.  Musculoskeletal:        General: No swelling. Normal range of motion.     Cervical back: Normal range of motion.  Skin:    General: Skin is warm and dry.  Neurological:     General: No focal deficit present.     Mental Status: She is alert and oriented to person, place, and time.  Psychiatric:        Mood and Affect: Mood normal.        Behavior: Behavior normal.     Data Reviewed:  There are no new results to review at this time.    Latest Ref Rng & Units 09/17/2023   11:52 AM 08/04/2023    4:08 AM 08/03/2023    3:46 AM  CBC  WBC  4.0 - 10.5 K/uL 29.0  15.5  18.3   Hemoglobin 12.0 - 15.0 g/dL 95.6  21.3  08.6   Hematocrit 36.0 - 46.0 % 37.7  38.7  37.5   Platelets 150 - 400 K/uL 296  223  212       Latest Ref Rng & Units 09/17/2023    2:30 PM 09/17/2023   11:52 AM 09/08/2023   11:20 AM  CMP  Glucose 70 - 99 mg/dL 578  469  95   BUN 8 - 23 mg/dL 629  528  52   Creatinine 0.44 - 1.00 mg/dL 4.13  2.44  0.10   Sodium 135 - 145 mmol/L 134  131  133   Potassium 3.5 - 5.1 mmol/L 4.3  4.7  4.6   Chloride 98 - 111 mmol/L 101  97  102   CO2 22 - 32 mmol/L 14  12  21    Calcium 8.9 - 10.3 mg/dL 27.2  53.6  64.4   Total Protein 6.5 - 8.1 g/dL  7.0  8.2   Total Bilirubin 0.0 - 1.2 mg/dL  <0.3  0.3   Alkaline Phos 38 - 126 U/L  120  171   AST 15 - 41 U/L  28  35   ALT 0 - 44 U/L  26  33    Lactic Acid, Venous    Component Value Date/Time   LATICACIDVEN 2.2 (HH) 09/17/2023 1408   Urinalysis    Component Value Date/Time   COLORURINE YELLOW 09/17/2023 1152   APPEARANCEUR CLEAR 09/17/2023 1152   LABSPEC 1.019 09/17/2023 1152   PHURINE 5.5 09/17/2023 1152   GLUCOSEU NEGATIVE 09/17/2023 1152   HGBUR NEGATIVE 09/17/2023 1152   BILIRUBINUR NEGATIVE 09/17/2023 1152   KETONESUR NEGATIVE 09/17/2023 1152   PROTEINUR TRACE (A) 09/17/2023 1152   NITRITE NEGATIVE 09/17/2023 1152   LEUKOCYTESUR NEGATIVE 09/17/2023 1152   Lipase     Component Value Date/Time   LIPASE 185 (H) 09/17/2023 1152   CT ABDOMEN PELVIS WO CONTRAST Result Date: 09/17/2023 CLINICAL DATA:  Abdominal  pain, acute, nonlocalized. EXAM: CT ABDOMEN AND PELVIS WITHOUT CONTRAST TECHNIQUE: Multidetector CT imaging of the abdomen and pelvis was performed following the standard protocol without IV contrast. RADIATION DOSE REDUCTION: This exam was performed according to the departmental dose-optimization program which includes automated exposure control, adjustment of the mA and/or kV according to patient size and/or use of iterative reconstruction technique.  COMPARISON:  CT scan abdomen and pelvis from 08/02/2023. FINDINGS: Lower chest: There are areas of atelectasis/scarring and random nodules in the right lung lower lobe, which are nonspecific and may represent sequela of prior infection or inflammation. The lung bases are otherwise clear. No pleural effusion. The heart is normal in size. No pericardial effusion. There are coronary artery atherosclerotic calcifications, in keeping with coronary artery disease. Hepatobiliary: The liver is normal in size. Non-cirrhotic configuration. No suspicious mass. No intrahepatic or extrahepatic bile duct dilation. No calcified gallstones. Normal gallbladder wall thickness. No pericholecystic inflammatory changes. Pancreas: Evaluation of pancreas is markedly limited due to lack of oral and intravenous contrast and paucity of visceral fat. Spleen: Within normal limits. No focal lesion. Adrenals/Urinary Tract: Redemonstration of low-attenuation thickening of bilateral adrenal glands without significant interval change. No suspicious nodule. Note is made of malascended right kidney. No suspicious renal mass within the limitations of this unenhanced exam. There are at least five, 4 mm or smaller nonobstructing calculi in the left kidney and at least six, 3 mm or smaller nonobstructing calculi in the right kidney. No hydroureteronephrosis or ureterolithiasis on either side. Urinary bladder is under distended, precluding optimal assessment. However, no large mass or stones identified. No perivesical fat stranding. Stomach/Bowel: No disproportionate dilation of the small or large bowel loops. No evidence of abnormal bowel wall thickening or inflammatory changes. The appendix is unremarkable. Vascular/Lymphatic: No ascites or pneumoperitoneum. No abdominal or pelvic lymphadenopathy, by size criteria. No aneurysmal dilation of the major abdominal arteries. There are mild peripheral atherosclerotic vascular calcifications of the aorta and  its major branches. Reproductive: Not well evaluated on this exam. Normal uterus is not distinctly visualized. Other: The visualized soft tissues and abdominal wall are unremarkable. Musculoskeletal: No suspicious osseous lesions. There are mild multilevel degenerative changes in the visualized spine. IMPRESSION: 1. No acute inflammatory process identified within the abdomen or pelvis. 2. Multiple bilateral nonobstructing renal calculi, as described above. No hydroureteronephrosis or ureterolithiasis on either side. 3. Multiple other nonacute observations, as described above. Aortic Atherosclerosis (ICD10-I70.0). Electronically Signed   By: Beula Brunswick M.D.   On: 09/17/2023 13:30      Assessment and Plan: No notes have been filed under this hospital service. Service: Hospitalist  Acute gastroenteritis Hx of functional diarrhea Patient presenting with 1 week history of multiple loose watery stools, concerning for acute gastroenteritis.  She had a very similar presentation about a month and a half ago.  She does have a leukocytosis and mildly elevated lactic acid on lab work, though otherwise has remained afebrile and is hemodynamically stable.  She does not appear to be septic.  Her lactic acid has normalized.  C. difficile quick screen negative.  GI panel is pending as are blood cultures.  CT abdomen pelvis without any acute intra-abdominal abnormalities noted.  She received a dose of Rocephin  and metronidazole in the ED, will continue on admission for now until GI panel results. - Continue Rocephin  and metronidazole -Status post 2.3 liters IV fluid bolus, now on LR infusion at 150 cc an hour for 12 more hours - Follow-up GI panel, enteric precautions  until this results - Follow-up blood cultures - Trend WBC and fever curves - Zofran  every 6 hours as needed for nausea - Carb modified diet - Admit to inpatient/telemetry  Acute renal failure AGMA Baseline creatinine appears to be normal.  On  admission, creatinine 5.7 with BUN 178.  Repeat BMP this afternoon showing mild improvement with creatinine 5.02 and BUN 169.  She does have an anion gap metabolic acidosis likely due to her acute renal failure.  Renal failure most likely secondary to significant dehydration from GI losses.  She has received 2.3 L IV fluid boluses and is currently on LR infusion at 150 cc an hour for 12 more hours.  CT abdomen and pelvis showing multiple bilateral nonobstructing renal calculi with no signs of hydroureteronephrosis or ureterolithiasis on either side.  Will obtain a renal ultrasound for closer evaluation to rule out obstructive uropathy. - Continue IV fluid rehydration - Follow-up renal ultrasound - Trend kidney function closely - Strict I/O's, monitor urine output - Avoid nephrotoxic medications as able - Be wary of possible ATN (though no granular casts noted on UA) - Consider nephrology consult tomorrow morning if no improvement in kidney function  Type 2 diabetes with hyperglycemia A1c 6.3% on 08/03/2023.  Patient is on metformin 500 mg 3 times daily at home.  Blood glucose 293 on CMP today.  CBG with glucose 227 and repeat at 185.  Initially there was concern for possible DKA.  She does have a very mildly elevated beta hydroxybutyric acid but otherwise urinalysis negative for ketones and glucose.  Her acidosis is more likely due to her acute renal failure. - Moderate SSI - Trend CBGs, goal 140-180 - Holding home metformin  Hypertension Blood pressures ranging in the 120s-150s/60s.  Patient is on metoprolol  tartrate 50 mg daily at home. - Resume home metoprolol  tartrate  Hyperlipidemia - Resume home Zetia  10 mg daily and simvastatin  40 mg daily  Moderate malnutrition -RD consulted -PT/OT eval -glucerna TID between meals  Depression - Resume home Celexa  20 mg daily  Uveitis Bilateral glaucoma - Resume home prednisolone , Liquifilm, dorzolamide , timolol  eyedrops - Continue following  ophthalmology in the outpatient setting  Asthma -Chronic and stable, no wheezing on exam, saturating well on room air - Not on any inhalers for this at home  Sarcoidosis - Chronic and stable - Not on any medications for this in the outpatient setting   Advance Care Planning:   Code Status: Full Code   Consults: none  Family Communication: updated family at bedside  Severity of Illness: The appropriate patient status for this patient is INPATIENT. Inpatient status is judged to be reasonable and necessary in order to provide the required intensity of service to ensure the patient's safety. The patient's presenting symptoms, physical exam findings, and initial radiographic and laboratory data in the context of their chronic comorbidities is felt to place them at high risk for further clinical deterioration. Furthermore, it is not anticipated that the patient will be medically stable for discharge from the hospital within 2 midnights of admission.   * I certify that at the point of admission it is my clinical judgment that the patient will require inpatient hospital care spanning beyond 2 midnights from the point of admission due to high intensity of service, high risk for further deterioration and high frequency of surveillance required.*  Portions of this note were generated with Dragon dictation software. Dictation errors may occur despite best attempts at proofreading.  Author: Mandy Second, MD 09/17/2023 6:08  PM  For on call review [email or web address].

## 2023-09-17 NOTE — ED Triage Notes (Signed)
 Patient arrives POV with complaints of diarrhea, elevated blood sugar (300's), minimal PO intake, and lethargy x1 week. Patient also took Bactrim which may have caused the diarrhea.  Accompanied by her daughter. Patient also had a recent hospital admission for similar symptoms.

## 2023-09-18 ENCOUNTER — Inpatient Hospital Stay (HOSPITAL_COMMUNITY)

## 2023-09-18 DIAGNOSIS — H401123 Primary open-angle glaucoma, left eye, severe stage: Secondary | ICD-10-CM

## 2023-09-18 DIAGNOSIS — E440 Moderate protein-calorie malnutrition: Secondary | ICD-10-CM | POA: Diagnosis not present

## 2023-09-18 DIAGNOSIS — N179 Acute kidney failure, unspecified: Secondary | ICD-10-CM | POA: Diagnosis not present

## 2023-09-18 DIAGNOSIS — N132 Hydronephrosis with renal and ureteral calculous obstruction: Secondary | ICD-10-CM | POA: Diagnosis not present

## 2023-09-18 DIAGNOSIS — R6510 Systemic inflammatory response syndrome (SIRS) of non-infectious origin without acute organ dysfunction: Secondary | ICD-10-CM | POA: Diagnosis not present

## 2023-09-18 DIAGNOSIS — K529 Noninfective gastroenteritis and colitis, unspecified: Secondary | ICD-10-CM | POA: Diagnosis not present

## 2023-09-18 DIAGNOSIS — D869 Sarcoidosis, unspecified: Secondary | ICD-10-CM

## 2023-09-18 LAB — RENAL FUNCTION PANEL
Albumin: 3 g/dL — ABNORMAL LOW (ref 3.5–5.0)
Anion gap: 10 (ref 5–15)
BUN: 114 mg/dL — ABNORMAL HIGH (ref 8–23)
CO2: 14 mmol/L — ABNORMAL LOW (ref 22–32)
Calcium: 8.9 mg/dL (ref 8.9–10.3)
Chloride: 113 mmol/L — ABNORMAL HIGH (ref 98–111)
Creatinine, Ser: 2.68 mg/dL — ABNORMAL HIGH (ref 0.44–1.00)
GFR, Estimated: 17 mL/min — ABNORMAL LOW (ref >60)
Glucose, Bld: 190 mg/dL — ABNORMAL HIGH (ref 70–99)
Phosphorus: 2.8 mg/dL (ref 2.5–4.6)
Potassium: 3.6 mmol/L (ref 3.5–5.1)
Sodium: 137 mmol/L (ref 135–145)

## 2023-09-18 LAB — GASTROINTESTINAL PANEL BY PCR, STOOL (REPLACES STOOL CULTURE)

## 2023-09-18 LAB — GLUCOSE, CAPILLARY
Glucose-Capillary: 187 mg/dL — ABNORMAL HIGH (ref 70–99)
Glucose-Capillary: 212 mg/dL — ABNORMAL HIGH (ref 70–99)
Glucose-Capillary: 189 mg/dL — ABNORMAL HIGH (ref 70–99)
Glucose-Capillary: 221 mg/dL — ABNORMAL HIGH (ref 70–99)
Glucose-Capillary: 201 mg/dL — ABNORMAL HIGH (ref 70–99)

## 2023-09-18 LAB — CBC
HCT: 36.3 % (ref 36.0–46.0)
Hemoglobin: 11.2 g/dL — ABNORMAL LOW (ref 12.0–15.0)
MCH: 31 pg (ref 26.0–34.0)
MCHC: 30.9 g/dL (ref 30.0–36.0)
MCV: 100.6 fL — ABNORMAL HIGH (ref 80.0–100.0)
Platelets: 244 10*3/uL (ref 150–400)
RBC: 3.61 MIL/uL — ABNORMAL LOW (ref 3.87–5.11)
RDW: 15.1 % (ref 11.5–15.5)
WBC: 24.6 10*3/uL — ABNORMAL HIGH (ref 4.0–10.5)
nRBC: 0 % (ref 0.0–0.2)

## 2023-09-18 MED ORDER — HYDRALAZINE HCL 20 MG/ML IJ SOLN
5.0000 mg | Freq: Four times a day (QID) | INTRAMUSCULAR | Status: AC | PRN
  Administered 2023-09-18 – 2023-09-19 (×2): 5 mg via INTRAVENOUS
  Filled 2023-09-18 (×2): qty 1

## 2023-09-18 MED ORDER — LACTATED RINGERS IV SOLN: INTRAVENOUS | Status: AC

## 2023-09-18 MED ORDER — SODIUM BICARBONATE 650 MG PO TABS
650.0000 mg | ORAL_TABLET | Freq: Two times a day (BID) | ORAL | Status: DC
  Administered 2023-09-18 – 2023-09-20 (×5): 650 mg via ORAL
  Filled 2023-09-18 (×5): qty 1

## 2023-09-18 MED ORDER — BANATROL TF EN LIQD
60.0000 mL | Freq: Two times a day (BID) | ENTERAL | Status: DC
  Administered 2023-09-18 – 2023-09-20 (×3): 60 mL via ORAL
  Filled 2023-09-18 (×4): qty 60

## 2023-09-18 MED ORDER — ADULT MULTIVITAMIN W/MINERALS CH
1.0000 | ORAL_TABLET | Freq: Every day | ORAL | Status: DC
  Administered 2023-09-18 – 2023-09-19 (×2): 1 via ORAL
  Filled 2023-09-18 (×2): qty 1

## 2023-09-18 MED ORDER — LOPERAMIDE HCL 2 MG PO CAPS
2.0000 mg | ORAL_CAPSULE | ORAL | Status: DC | PRN
  Administered 2023-09-18 – 2023-09-19 (×2): 2 mg via ORAL
  Filled 2023-09-18 (×3): qty 1

## 2023-09-18 MED ORDER — TRAZODONE HCL 50 MG PO TABS
50.0000 mg | ORAL_TABLET | Freq: Every evening | ORAL | Status: DC | PRN
  Administered 2023-09-18 – 2023-09-19 (×2): 50 mg via ORAL
  Filled 2023-09-18 (×2): qty 1

## 2023-09-18 NOTE — Progress Notes (Signed)
   09/18/23 1501  TOC Brief Assessment  Insurance and Status Reviewed  Patient has primary care physician Yes  Home environment has been reviewed Single family home  Prior level of function: Independent  Prior/Current Home Services No current home services  Social Drivers of Health Review SDOH reviewed no interventions necessary  Readmission risk has been reviewed Yes  Transition of care needs transition of care needs identified, TOC will continue to follow

## 2023-09-18 NOTE — Plan of Care (Addendum)
 VSS. Patient given PRN Tylenol  for pain. Patient ambulating to bathroom with standby assist throughout shift. BG 180-200s this shift. LBM 5/23. LR infusing at 150ml/hr. No acute events overnight.  Problem: Education: Goal: Knowledge of General Education information will improve Description: Including pain rating scale, medication(s)/side effects and non-pharmacologic comfort measures Outcome: Progressing   Problem: Health Behavior/Discharge Planning: Goal: Ability to manage health-related needs will improve Outcome: Progressing   Problem: Clinical Measurements: Goal: Ability to maintain clinical measurements within normal limits will improve Outcome: Progressing Goal: Will remain free from infection Outcome: Progressing   Problem: Nutrition: Goal: Adequate nutrition will be maintained Outcome: Progressing   Problem: Pain Managment: Goal: General experience of comfort will improve and/or be controlled Outcome: Progressing   Problem: Safety: Goal: Ability to remain free from injury will improve Outcome: Progressing   Problem: Skin Integrity: Goal: Risk for impaired skin integrity will decrease Outcome: Progressing

## 2023-09-18 NOTE — Progress Notes (Signed)
 PT Cancellation Note  Patient Details Name: Sabrina Eaton MRN: [id number] DOB: 02/23/1941   Cancelled Treatment:    Reason Eval/Treat Not Completed: Patient declined, no reason specified Pt politely declined due to fatigue and lack of sleep.  RN reports pt has been up to bathroom numerous times.  Will check back as schedule permits.   Myna Asal Payson 09/18/2023, 2:56 PM Blanch Bunde, DPT Physical Therapist Acute Rehabilitation Services Office: [phone]

## 2023-09-18 NOTE — Progress Notes (Signed)
 Initial Nutrition Assessment  INTERVENTION:   -Glucerna Shake po TID, each supplement provides 220 kcal and 10 grams of protein   -Multivitamin with minerals daily  -Banatrol fiber supplement BID, each provides 45 kcals, 2g protein and 5g soluble fiber to aid diarrhea   NUTRITION DIAGNOSIS:   Increased nutrient needs related to acute illness as evidenced by estimated needs.  GOAL:   Patient will meet greater than or equal to 90% of their needs  MONITOR:   PO intake, Supplement acceptance  REASON FOR ASSESSMENT:   Consult Assessment of nutrition requirement/status, Poor PO  ASSESSMENT:   83 y.o. female with medical history significant of functional diarrhea, type 2 diabetes, gastritis, sarcoidosis primarily affecting the eyes, bilateral glaucoma, hypertension, hyperlipidemia, asthma, malnutrition presenting to the ED with diarrhea for the past week.  Unable to speak with patient at time of visit, having ultrasound and then requesting patient care from RN following. Will attempt to speak with patient at later time. Per chart review, pt with diarrhea over the past week. Family had reported pt has had decrease PO for some time but this worsened over the past week with current symptoms. C.diff negative.  Currently consuming 100% of meals. Will order Banatrol to help with diarrhea and MVI given losses. Glucerna shakes ordered, will continue.  Per weight records, no weight loss noted. Need to monitor.  Medications: Imodium  Labs reviewed: CBGs: 187-212    NUTRITION - FOCUSED PHYSICAL EXAM:  Unable to complete  Diet Order:   Diet Order             Diet Carb Modified Fluid consistency: Thin; Room service appropriate? Yes  Diet effective now                   EDUCATION NEEDS:   Not appropriate for education at this time  Skin:  Skin Assessment: Reviewed RN Assessment  Last BM:  5/23  Height:   Ht Readings from Last 1 Encounters:  09/17/23 5' (1.524 m)     Weight:   Wt Readings from Last 1 Encounters:  09/17/23 44.3 kg    BMI:  Body mass index is 19.07 kg/m.  Estimated Nutritional Needs:   Kcal:  1400-1600  Protein:  65-75g  Fluid:  1.8L/day  Arna Better, MS, RD, LDN Inpatient Clinical Dietitian Contact via Secure chat

## 2023-09-18 NOTE — Evaluation (Signed)
 Occupational Therapy Evaluation Patient Details Name: Sabrina Eaton MRN: [id number] DOB: 1940/10/07 Today's Date: 09/18/2023   History of Present Illness   Sabrina Eaton is a 83 y.o. female presenting to the ED with diarrhea for the past week admitted to [facility] with dx of acute gastroenteritis.  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 Reports occasionally using rollator as needed on outings requiring longer distances. Currently, patient presents with deficits outlined below (see OT Problem List for details) most significantly generalized muscle weakness and decreased activity tolerance impacting BADL's and mobility. Son bedside for OT initial education re: use of RW and keeping BSC closer to bed as needed with bowel urgency only. Patient preferring no RW and may be open to commode suggestion. OT will continue to follow on acute unit but anticipate with family support and assistance will no require f/u OT post discharge.      If plan is discharge home, recommend the following:   A little help with walking and/or transfers;A little help with bathing/dressing/bathroom;Assistance with cooking/housework;Assist for transportation;Help with stairs or ramp for entrance     Functional Status Assessment   Patient has had a recent decline in their functional status and demonstrates the ability to make significant improvements in function in a reasonable and predictable amount of time.     Equipment Recommendations   None recommended by OT      Precautions/Restrictions   Precautions Precautions: Fall Restrictions Weight Bearing Restrictions Per Provider Order: No     Mobility  Bed Mobility Overal bed mobility: Modified Independent             General bed mobility comments: HOB elevated; sleeps in a recliner at home    Transfers Overall transfer level: Needs assistance Equipment used: None Transfers: Sit to/from Stand, Bed to chair/wheelchair/BSC Sit to Stand: Supervision     Step pivot transfers: Supervision     General transfer comment: amb to and from bathroom with IV pole      Balance Overall balance assessment: No apparent balance deficits (not formally assessed)                                         ADL either performed or assessed with clinical judgement   ADL Overall ADL's : Needs assistance/impaired Eating/Feeding: Independent;Sitting   Grooming: Standing;Supervision/safety;Wash/dry hands;Wash/dry face   Upper Body Bathing: Supervision/ safety;Sitting   Lower Body Bathing: Contact guard assist;Sit to/from stand   Upper Body Dressing : Sitting;Set up   Lower Body Dressing: Contact guard assist;Sit to/from stand   Toilet Transfer: Supervision/safety;Ambulation;Regular Social worker and Hygiene: Set up   Tub/ Shower Transfer: Supervision/safety   Functional mobility during ADLs: Supervision/safety General ADL Comments: defers suggestion to keep RW by bedside in case of unsteadiness as well as BSC in case of urgency but agreeable to listen to suggestions     Vision Baseline Vision/History: 0 No visual deficits;1 Wears glasses Ability to See in Adequate Light: 0 Adequate Patient Visual Report: No change from baseline Vision Assessment?: No apparent visual deficits;Wears glasses for reading     Perception  Praxis         Pertinent Vitals/Pain Pain Assessment Pain Assessment: No/denies pain     Extremity/Trunk Assessment Upper Extremity Assessment Upper Extremity Assessment: Right hand dominant;Generalized weakness   Lower Extremity Assessment Lower Extremity  Assessment: Generalized weakness   Cervical / Trunk Assessment Cervical / Trunk Assessment: Kyphotic   Communication Communication Communication: No apparent difficulties   Cognition Arousal: Alert Behavior During Therapy: WFL for tasks assessed/performed Cognition: No apparent impairments                               Following commands: Intact       Cueing  General Comments   Cueing Techniques: Verbal cues  low BMI           Home Living Family/patient expects to be discharged to:: Private residence Living Arrangements: Children Available Help at Discharge: Family;Available 24 hours/day Type of Home: House Home Access: Level entry     Home Layout: Two level;Able to live on main level with bedroom/bathroom Alternate Level Stairs-Number of Steps: FF Alternate Level Stairs-Rails: Can reach both;Right;Left Bathroom Shower/Tub: Walk-in shower;Door   [employer] Toilet: Handicapped height Bathroom Accessibility: Yes How Accessible: Accessible via wheelchair;Accessible via walker Home Equipment: Shower seat - built in;Wheelchair - manual;Rollator (4 wheels);Grab bars - tub/shower;Hand held shower head          Prior Functioning/Environment Prior Level of Function : Independent/Modified Independent;Driving;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)             Mobility Comments: independent without AD except long distances may use rollator from time to time, works at Child [employer] and mobilizes indep ADLs Comments: independent A/BADL's except son assists with some meals and groceries    OT Problem List: Decreased strength   OT Treatment/Interventions: Self-care/ADL training;Therapeutic exercise;Energy conservation;DME and/or AE instruction;Therapeutic activities;Patient/family education      OT Goals(Current goals can be found in the care plan section)   Acute Rehab OT Goals Patient Stated Goal: to get some sleep and get stronger OT Goal  Formulation: With patient/family Time For Goal Achievement: 10/02/23 Potential to Achieve Goals: Good ADL Goals Pt Will Perform Lower Body Bathing: with modified independence;sit to/from stand Pt Will Perform Lower Body Dressing: sit to/from stand;with modified independence Pt Will Transfer to Toilet: with modified independence;ambulating Pt/caregiver will Perform Home Exercise Program: Increased strength;Independently;With written HEP provided;Both right and left upper extremity Additional ADL Goal #1: Patient will teach back 4/4 ECT's for A/IADL's and mobility indepdenently   OT Frequency:  Min 2X/week    AM-PAC OT "6 Clicks" Daily Activity     Outcome Measure Help from another person eating meals?: None Help from another person taking care of personal grooming?: None Help from another person toileting, which includes using toliet, bedpan, or urinal?: A Little Help from another person bathing (including washing, rinsing, drying)?: A Little Help from another person to put on and taking off regular upper body clothing?: None Help from another person to put on and taking off regular lower body clothing?: A Little 6 Click Score: 21   End of Session Equipment Utilized During Treatment: Gait belt Nurse Communication: Mobility status;Other (comment) (voiding data in flowsheets)  Activity Tolerance: Patient limited by fatigue Patient left: in bed;with call bell/phone within reach;with family/visitor present  OT Visit Diagnosis: Muscle weakness (generalized) (M62.81)                Time: 1255-1330 OT Time Calculation (min): 35  min Charges:  OT General Charges $OT Visit: 1 Visit OT Evaluation $OT Eval Low Complexity: 1 Low OT Treatments $Self Care/Home Management : 8-22 mins  Tondra Reierson OT/L Acute Rehabilitation Department  [phone]  09/18/2023, 1:50 PM

## 2023-09-18 NOTE — Progress Notes (Signed)
 [facility]  PROGRESS NOTE  Sabrina Eaton ZOX:[id number] DOB: Sep 25, 1940 DOA: 09/17/2023 PCP: Pcp, No   Brief HPI:    83 y.o. female with medical history significant of functional diarrhea, type 2 diabetes, gastritis, sarcoidosis primarily affecting the eyes, bilateral glaucoma, hypertension, hyperlipidemia, asthma, malnutrition presenting to the ED with diarrhea for the past week. , bicarb 12, glucose 293, creatinine 5.7 (baseline creatinine appears to be normal), BUN 178, anion gap 23.  Lipase 185.  Urinalysis negative for infection and without any ketonuria.  Lactic acid 2.2 x 2.  C. difficile quick screen negative.  Blood cultures ordered and collected.  GI panel pending.  Beta hydroxybutyric acid mildly elevated at 0.28.  Repeat BMP in the afternoon with sodium 134, bicarb 14, glucose 235, creatinine 5.02, BUN 169, anion gap 19.  CT abdomen pelvis without contrast without any acute intraabdominal abnormalities noted.       Assessment/Plan:   Acute gastroenteritis Hx of functional diarrhea Patient presenting with 1 week history of multiple loose watery stools, concerning for acute gastroenteritis.  She had a very similar presentation about a month and a half ago. - C. difficile quick screen negative.  GI pathogen panel is negative.   -CT abdomen pelvis without any acute intra-abdominal abnormalities noted.  She received a dose of Rocephin  and metronidazole in the ED, will continue on admission for now until GI panel results. -Will discontinue IV antibiotics    Acute renal failure AGMA Baseline creatinine appears to be normal.  On admission, creatinine 5.7 with BUN 178.  Repeat BMP this afternoon showing mild improvement with creatinine 5.02 and BUN 169.  She does have an anion gap metabolic acidosis likely due to her acute renal failure.  Renal failure most likely secondary to significant dehydration from GI losses.  - Continue IV fluid rehydration -BUN/creatinine has improved. -  Follow-up renal ultrasound - Trend kidney function closely - Strict I/O's, monitor urine output - Avoid nephrotoxic medications as able    Type 2 diabetes with hyperglycemia A1c 6.3% on 08/03/2023.  Patient is on metformin 500 mg 3 times daily at home.   - Moderate SSI - Trend CBGs, goal 140-180 - Holding home metformin   Hypertension Blood pressures ranging in the 120s-150s/60s.  Patient is on metoprolol  tartrate 50 mg daily at home. - Resume home metoprolol  tartrate   Hyperlipidemia - Resume home Zetia  10 mg daily and simvastatin  40 mg daily   Moderate malnutrition -RD consulted -PT/OT eval -glucerna TID between meals   Depression - Resume home Celexa  20 mg daily   Uveitis Bilateral glaucoma - Resume home prednisolone , Liquifilm, dorzolamide , timolol  eyedrops - Continue following ophthalmology in the outpatient setting   Asthma -Chronic and stable, no wheezing on exam, saturating well on room air - Not on any inhalers for this at home   Sarcoidosis - Chronic and stable - Not on any medications for this in the outpatient setting      Medications     citalopram   20 mg Oral Daily   dorzolamide   1 drop Left Eye BID   ezetimibe   10 mg Oral Daily   And   simvastatin   40 mg Oral q morning   feeding supplement (GLUCERNA SHAKE)  237 mL Oral TID BM   fiber supplement (BANATROL TF)  60 mL Oral BID   heparin   5,000 Units Subcutaneous Q8H   insulin  aspart  0-15 Units Subcutaneous TID WC   metoprolol  tartrate  50 mg Oral Daily   multivitamin with  minerals  1 tablet Oral Daily   polyvinyl alcohol   4 drop Both Eyes QHS   [START ON 09/19/2023] prednisoLONE  acetate  1 drop Both Eyes QODAY   sodium bicarbonate   650 mg Oral BID   timolol   1 drop Both Eyes BID     Data Reviewed:   CBG:  Recent Labs  Lab 09/17/23 2029 09/17/23 2327 09/18/23 0540 09/18/23 0830 09/18/23 1308  GLUCAP 235* 213* 187* 212* 189*    SpO2: 98 %    Vitals:   09/17/23 1947 09/17/23 2325  09/18/23 0541 09/18/23 0901  BP: (!) 152/73 138/66 (!) 150/77 131/69  Pulse: 92 86 (!) 104 (!) 102  Resp:  18 16   Temp:   97.9 F (36.6 C)   TempSrc:   Oral   SpO2: 100% 99% 100% 98%  Weight:      Height:          Data Reviewed:  Basic Metabolic Panel: Recent Labs  Lab 09/17/23 1152 09/17/23 1430 09/18/23 0528  NA 131* 134* 137  K 4.7 4.3 3.6  CL 97* 101 113*  CO2 12* 14* 14*  GLUCOSE 293* 235* 190*  BUN 178* 169* 114*  CREATININE 5.70* 5.02* 2.68*  CALCIUM 10.3 10.0 8.9  PHOS  --   --  2.8    CBC: Recent Labs  Lab 09/17/23 1152 09/18/23 0528  WBC 29.0* 24.6*  HGB 12.1 11.2*  HCT 37.7 36.3  MCV 98.4 100.6*  PLT 296 244    LFT Recent Labs  Lab 09/17/23 1152 09/18/23 0528  AST 28  --   ALT 26  --   ALKPHOS 120  --   BILITOT <0.2  --   PROT 7.0  --   ALBUMIN 3.7 3.0*     Antibiotics: Anti-infectives (From admission, onward)    Start     Dose/Rate Route Frequency Ordered Stop   09/18/23 1000  cefTRIAXone  (ROCEPHIN ) 2 g in sodium chloride  0.9 % 100 mL IVPB        2 g 200 mL/hr over 30 Minutes Intravenous Every 24 hours 09/17/23 2007     09/17/23 2200  metroNIDAZOLE (FLAGYL) IVPB 500 mg        500 mg 100 mL/hr over 60 Minutes Intravenous Every 12 hours 09/17/23 2007     09/17/23 1300  cefTRIAXone  (ROCEPHIN ) 2 g in sodium chloride  0.9 % 100 mL IVPB        2 g 200 mL/hr over 30 Minutes Intravenous  Once 09/17/23 1258 09/17/23 1347   09/17/23 1300  metroNIDAZOLE (FLAGYL) IVPB 500 mg        500 mg 100 mL/hr over 60 Minutes Intravenous  Once 09/17/23 1258 09/17/23 1505        DVT prophylaxis: Heparin   Code Status: Full code  Family Communication: No family at bedside   CONSULTS    Subjective   Continues to have diarrhea   Objective    Physical Examination:   General-appears in no acute distress Heart-S1-S2, regular, no murmur auscultated Lungs-clear to auscultation bilaterally, no wheezing or crackles  auscultated Abdomen-soft, nontender, no organomegaly Extremities-no edema in the lower extremities Neuro-alert, oriented x3, no focal deficit noted   Status is: Inpatient:             Ozell Blunt   [facility] If 7PM-7AM, please contact night-coverage at [email or web address], Office  [phone]   09/18/2023, 3:56 PM  LOS: 1 day

## 2023-09-19 DIAGNOSIS — K529 Noninfective gastroenteritis and colitis, unspecified: Secondary | ICD-10-CM | POA: Diagnosis not present

## 2023-09-19 DIAGNOSIS — E440 Moderate protein-calorie malnutrition: Secondary | ICD-10-CM | POA: Diagnosis not present

## 2023-09-19 DIAGNOSIS — D869 Sarcoidosis, unspecified: Secondary | ICD-10-CM | POA: Diagnosis not present

## 2023-09-19 DIAGNOSIS — I10 Essential (primary) hypertension: Secondary | ICD-10-CM | POA: Diagnosis not present

## 2023-09-19 DIAGNOSIS — R6510 Systemic inflammatory response syndrome (SIRS) of non-infectious origin without acute organ dysfunction: Secondary | ICD-10-CM | POA: Diagnosis not present

## 2023-09-19 LAB — CBC
HCT: 36.6 % (ref 36.0–46.0)
Hemoglobin: 11.7 g/dL — ABNORMAL LOW (ref 12.0–15.0)
MCH: 31 pg (ref 26.0–34.0)
MCHC: 32 g/dL (ref 30.0–36.0)
MCV: 97.1 fL (ref 80.0–100.0)
Platelets: 225 10*3/uL (ref 150–400)
RBC: 3.77 MIL/uL — ABNORMAL LOW (ref 3.87–5.11)
RDW: 14.8 % (ref 11.5–15.5)
WBC: 26.2 10*3/uL — ABNORMAL HIGH (ref 4.0–10.5)
nRBC: 0 % (ref 0.0–0.2)

## 2023-09-19 LAB — GLUCOSE, CAPILLARY
Glucose-Capillary: 141 mg/dL — ABNORMAL HIGH (ref 70–99)
Glucose-Capillary: 152 mg/dL — ABNORMAL HIGH (ref 70–99)
Glucose-Capillary: 157 mg/dL — ABNORMAL HIGH (ref 70–99)
Glucose-Capillary: 168 mg/dL — ABNORMAL HIGH (ref 70–99)
Glucose-Capillary: 192 mg/dL — ABNORMAL HIGH (ref 70–99)
Glucose-Capillary: 214 mg/dL — ABNORMAL HIGH (ref 70–99)

## 2023-09-19 LAB — COMPREHENSIVE METABOLIC PANEL WITH GFR
ALT: 25 U/L (ref 0–44)
AST: 24 U/L (ref 15–41)
Albumin: 3 g/dL — ABNORMAL LOW (ref 3.5–5.0)
Alkaline Phosphatase: 84 U/L (ref 38–126)
Anion gap: 10 (ref 5–15)
BUN: 49 mg/dL — ABNORMAL HIGH (ref 8–23)
CO2: 19 mmol/L — ABNORMAL LOW (ref 22–32)
Calcium: 8.8 mg/dL — ABNORMAL LOW (ref 8.9–10.3)
Chloride: 111 mmol/L (ref 98–111)
Creatinine, Ser: 1.28 mg/dL — ABNORMAL HIGH (ref 0.44–1.00)
GFR, Estimated: 42 mL/min — ABNORMAL LOW (ref >60)
Glucose, Bld: 171 mg/dL — ABNORMAL HIGH (ref 70–99)
Potassium: 3 mmol/L — ABNORMAL LOW (ref 3.5–5.1)
Sodium: 140 mmol/L (ref 135–145)
Total Bilirubin: 0.5 mg/dL (ref 0.0–1.2)
Total Protein: 6.9 g/dL (ref 6.5–8.1)

## 2023-09-19 LAB — MAGNESIUM: Magnesium: 1.8 mg/dL (ref 1.7–2.4)

## 2023-09-19 MED ORDER — ATORVASTATIN CALCIUM 10 MG PO TABS
20.0000 mg | ORAL_TABLET | Freq: Every day | ORAL | Status: DC
  Administered 2023-09-20: 20 mg via ORAL
  Filled 2023-09-19: qty 2

## 2023-09-19 MED ORDER — POTASSIUM CHLORIDE 10 MEQ/100ML IV SOLN
10.0000 meq | INTRAVENOUS | Status: AC
  Administered 2023-09-19 (×3): 10 meq via INTRAVENOUS
  Filled 2023-09-19 (×3): qty 100

## 2023-09-19 MED ORDER — AMLODIPINE BESYLATE 5 MG PO TABS
5.0000 mg | ORAL_TABLET | Freq: Every day | ORAL | Status: DC
  Administered 2023-09-19 – 2023-09-20 (×2): 5 mg via ORAL
  Filled 2023-09-19 (×2): qty 1

## 2023-09-19 NOTE — Plan of Care (Addendum)
 SBP 160-170s, given PRN Hydralazine . All other VSS. Patient given PRN Tylenol  for pain. BG 150-200s this shift. LBM 5/23. No acute events overnight.  Problem: Education: Goal: Ability to describe self-care measures that may prevent or decrease complications (Diabetes Survival Skills Education) will improve Outcome: Progressing   Problem: Fluid Volume: Goal: Ability to maintain a balanced intake and output will improve Outcome: Progressing   Problem: Metabolic: Goal: Ability to maintain appropriate glucose levels will improve Outcome: Progressing   Problem: Nutritional: Goal: Maintenance of adequate nutrition will improve Outcome: Progressing Goal: Progress toward achieving an optimal weight will improve Outcome: Progressing   Problem: Education: Goal: Knowledge of General Education information will improve Description: Including pain rating scale, medication(s)/side effects and non-pharmacologic comfort measures Outcome: Progressing   Problem: Health Behavior/Discharge Planning: Goal: Ability to manage health-related needs will improve Outcome: Progressing   Problem: Clinical Measurements: Goal: Ability to maintain clinical measurements within normal limits will improve Outcome: Progressing   Problem: Pain Managment: Goal: General experience of comfort will improve and/or be controlled Outcome: Progressing   Problem: Safety: Goal: Ability to remain free from injury will improve Outcome: Progressing

## 2023-09-19 NOTE — Evaluation (Signed)
 Physical Therapy One Time Evaluation Patient Details Name: Sabrina Eaton MRN: [id number] DOB: 05/30/1940 Today's Date: 09/19/2023  History of Present Illness  Sabrina Eaton is a 83 y.o. female presenting to the ED with diarrhea for the past week admitted to [facility] with dx of acute gastroenteritis.  PMH:  functional diarrhea, type 2 diabetes, gastritis, sarcoidosis primarily affecting the eyes, bilateral glaucoma, hypertension, hyperlipidemia, asthma, malnutrition  Clinical Impression  Patient evaluated by Physical Therapy with no further acute PT needs identified. All education has been completed and the patient has no further questions.  Pt mobilizing independently in room and able to ambulate 120 ft in hallway without assistive device.  Pt anticipates d/c home tomorrow and agreeable to no further PT needs at this time.  PT is signing off. Thank you for this referral.         If plan is discharge home, recommend the following:     Can travel by private vehicle        Equipment Recommendations None recommended by PT  Recommendations for Other Services       Functional Status Assessment Patient has not had a recent decline in their functional status     Precautions / Restrictions Precautions Precautions: Fall      Mobility  Bed Mobility Overal bed mobility: Modified Independent                  Transfers Overall transfer level: Modified independent                      Ambulation/Gait Ambulation/Gait assistance: Supervision, Modified independent (Device/Increase time) Gait Distance (Feet): 120 Feet Assistive device: None Gait Pattern/deviations: WFL(Within Functional Limits)       General Gait Details: no unsteadiness or LOB observed, pt denies any symptoms, HR 117 bpm upon return to room  Stairs            Wheelchair Mobility     Tilt Bed    Modified Rankin (Stroke Patients Only)       Balance Overall balance assessment: No apparent  balance deficits (not formally assessed)                                           Pertinent Vitals/Pain Pain Assessment Pain Assessment: No/denies pain    Home Living Family/patient expects to be discharged to:: Private residence Living Arrangements: Children Available Help at Discharge: Family;Available 24 hours/day Type of Home: House Home Access: Level entry     Alternate Level Stairs-Number of Steps: flight Home Layout: Two level;Able to live on main level with bedroom/bathroom Home Equipment: Shower seat - built in;Wheelchair - manual;Rollator (4 wheels);Grab bars - tub/shower;Hand held shower head      Prior Function Prior Level of Function : Independent/Modified Independent;Driving;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)             Mobility Comments: independent without AD except long distances may use rollator from time to time, works at [employer] and mobilizes indep ADLs Comments: independent A/BADL's except son assists with some meals and groceries     Extremity/Trunk Assessment        Lower Extremity Assessment Lower Extremity Assessment: Overall WFL for tasks assessed    Cervical / Trunk Assessment Cervical / Trunk Assessment: Kyphotic  Communication   Communication Communication: No apparent difficulties    Cognition Arousal: Alert  Behavior During Therapy: WFL for tasks assessed/performed   PT - Cognitive impairments: No apparent impairments                         Following commands: Intact       Cueing       General Comments      Exercises     Assessment/Plan    PT Assessment Patient does not need any further PT services  PT Problem List         PT Treatment Interventions      PT Goals (Current goals can be found in the Care Plan section)  Acute Rehab PT Goals PT Goal Formulation: All assessment and education complete, DC therapy    Frequency       Co-evaluation                AM-PAC PT "6 Clicks" Mobility  Outcome Measure Help needed turning from your back to your side while in a flat bed without using bedrails?: None Help needed moving from lying on your back to sitting on the side of a flat bed without using bedrails?: None Help needed moving to and from a bed to a chair (including a wheelchair)?: None Help needed standing up from a chair using your arms (e.g., wheelchair or bedside chair)?: None Help needed to walk in hospital room?: None Help needed climbing 3-5 steps with a railing? : A Little 6 Click Score: 23    End of Session Equipment Utilized During Treatment: Gait belt Activity Tolerance: Patient tolerated treatment well Patient left: with call bell/phone within reach;in bed Nurse Communication: Mobility status PT Visit Diagnosis: Difficulty in walking, not elsewhere classified (R26.2)    Time: 6578-4696 PT Time Calculation (min) (ACUTE ONLY): 8 min   Charges:   PT Evaluation $PT Eval Low Complexity: 1 Low   PT General Charges $$ ACUTE PT VISIT: 1 Visit       Henretta Lodge PT, DPT Physical Therapist Acute Rehabilitation Services Office: [phone]   Myna Asal Payson 09/19/2023, 2:54 PM

## 2023-09-19 NOTE — Progress Notes (Signed)
 [facility]  PROGRESS NOTE  Sabrina Eaton XBJ:[id number] DOB: 1940/05/02 DOA: 09/17/2023 PCP: Pcp, No   Brief HPI:    83 y.o. female with medical history significant of functional diarrhea, type 2 diabetes, gastritis, sarcoidosis primarily affecting the eyes, bilateral glaucoma, hypertension, hyperlipidemia, asthma, malnutrition presenting to the ED with diarrhea for the past week. , bicarb 12, glucose 293, creatinine 5.7 (baseline creatinine appears to be normal), BUN 178, anion gap 23.  Lipase 185.  Urinalysis negative for infection and without any ketonuria.  Lactic acid 2.2 x 2.  C. difficile quick screen negative.  Blood cultures ordered and collected.  GI panel pending.  Beta hydroxybutyric acid mildly elevated at 0.28.  Repeat BMP in the afternoon with sodium 134, bicarb 14, glucose 235, creatinine 5.02, BUN 169, anion gap 19.  CT abdomen pelvis without contrast without any acute intraabdominal abnormalities noted.       Assessment/Plan:   Acute gastroenteritis Hx of functional diarrhea -Diarrhea improved with Imodium  and fiber supplement Banatrol Patient presenting with 1 week history of multiple loose watery stools, concerning for acute gastroenteritis.  She had a very similar presentation about a month and a half ago. - C. difficile quick screen negative.  GI pathogen panel is negative.   -CT abdomen pelvis without any acute intra-abdominal abnormalities noted.  She received a dose of Rocephin  and metronidazole  in the ED, will continue on admission for now until GI panel results. - IV antibiotics discontinued.     Acute renal failure AGMA -Renal function improving, creatinine down to 1.28 -CO2 19 Baseline creatinine appears to be normal.  On admission, creatinine 5.7 with BUN 178.  Repeat BMP this afternoon showing mild improvement with creatinine 5.02 and BUN 169.  She does have an anion gap metabolic acidosis likely due to her acute renal failure.  Renal failure most  likely secondary to significant dehydration from GI losses.  - Continue IV fluid rehydration -BUN/creatinine has improved. - Follow-up renal ultrasound - Trend kidney function closely - Strict I/O's, monitor urine output - Avoid nephrotoxic medications as able  Hypokalemia - Potassium is 3.0 - Replace potassium and follow BMP in a.m.    Type 2 diabetes with hyperglycemia A1c 6.3% on 08/03/2023.  Patient is on metformin 500 mg 3 times daily at home.   - Moderate SSI - Trend CBGs, goal 140-180 - Holding home metformin   Hypertension Blood pressures ranging in the 120s-150s/60s.  Patient is on metoprolol  tartrate 50 mg daily at home. - Resume home metoprolol  tartrate -Will add amlodipine  5 mg daily   Hyperlipidemia - Resume home Zetia  10 mg daily and simvastatin  40 mg daily   Moderate malnutrition -RD consulted -PT/OT eval -glucerna TID between meals   Depression - Resume home Celexa  20 mg daily   Uveitis Bilateral glaucoma - Resume home prednisolone , Liquifilm, dorzolamide , timolol  eyedrops - Continue following ophthalmology in the outpatient setting   Asthma -Chronic and stable, no wheezing on exam, saturating well on room air - Not on any inhalers for this at home   Sarcoidosis - Chronic and stable - Not on any medications for this in the outpatient setting      Medications     citalopram   20 mg Oral Daily   dorzolamide   1 drop Left Eye BID   ezetimibe   10 mg Oral Daily   And   simvastatin   40 mg Oral q morning   feeding supplement (GLUCERNA SHAKE)  237 mL Oral TID BM   fiber supplement (  BANATROL TF)  60 mL Oral BID   heparin   5,000 Units Subcutaneous Q8H   insulin  aspart  0-15 Units Subcutaneous TID WC   metoprolol  tartrate  50 mg Oral Daily   multivitamin with minerals  1 tablet Oral Daily   polyvinyl alcohol   4 drop Both Eyes QHS   prednisoLONE  acetate  1 drop Both Eyes QODAY   sodium bicarbonate   650 mg Oral BID   timolol   1 drop Both Eyes BID      Data Reviewed:   CBG:  Recent Labs  Lab 09/19/23 0003 09/19/23 0429 09/19/23 0751 09/19/23 1220 09/19/23 1620  GLUCAP 214* 152* 168* 157* 141*    SpO2: 100 %    Vitals:   09/18/23 2132 09/19/23 0007 09/19/23 0434 09/19/23 1222  BP: (!) 173/86 132/66 (!) 167/77 (!) 170/84  Pulse:  96 100 90  Resp:   18 18  Temp:   98.9 F (37.2 C) 98 F (36.7 C)  TempSrc:   Oral   SpO2:   97% 100%  Weight:      Height:          Data Reviewed:  Basic Metabolic Panel: Recent Labs  Lab 09/17/23 1152 09/17/23 1430 09/18/23 0528 09/19/23 0652  NA 131* 134* 137 140  K 4.7 4.3 3.6 3.0*  CL 97* 101 113* 111  CO2 12* 14* 14* 19*  GLUCOSE 293* 235* 190* 171*  BUN 178* 169* 114* 49*  CREATININE 5.70* 5.02* 2.68* 1.28*  CALCIUM 10.3 10.0 8.9 8.8*  MG  --   --   --  1.8  PHOS  --   --  2.8  --     CBC: Recent Labs  Lab 09/17/23 1152 09/18/23 0528 09/19/23 0652  WBC 29.0* 24.6* 26.2*  HGB 12.1 11.2* 11.7*  HCT 37.7 36.3 36.6  MCV 98.4 100.6* 97.1  PLT 296 244 225    LFT Recent Labs  Lab 09/17/23 1152 09/18/23 0528 09/19/23 0652  AST 28  --  24  ALT 26  --  25  ALKPHOS 120  --  84  BILITOT <0.2  --  0.5  PROT 7.0  --  6.9  ALBUMIN 3.7 3.0* 3.0*     Antibiotics: Anti-infectives (From admission, onward)    Start     Dose/Rate Route Frequency Ordered Stop   09/18/23 1000  cefTRIAXone  (ROCEPHIN ) 2 g in sodium chloride  0.9 % 100 mL IVPB        2 g 200 mL/hr over 30 Minutes Intravenous Every 24 hours 09/17/23 2007     09/17/23 2200  metroNIDAZOLE (FLAGYL) IVPB 500 mg        500 mg 100 mL/hr over 60 Minutes Intravenous Every 12 hours 09/17/23 2007     09/17/23 1300  cefTRIAXone  (ROCEPHIN ) 2 g in sodium chloride  0.9 % 100 mL IVPB        2 g 200 mL/hr over 30 Minutes Intravenous  Once 09/17/23 1258 09/17/23 1347   09/17/23 1300  metroNIDAZOLE (FLAGYL) IVPB 500 mg        500 mg 100 mL/hr over 60 Minutes Intravenous  Once 09/17/23 1258 09/17/23 1505         DVT prophylaxis: Heparin   Code Status: Full code  Family Communication: No family at bedside   CONSULTS    Subjective   Diarrhea has improved.   Objective    Physical Examination:   General-appears in no acute distress Heart-S1-S2, regular, no murmur auscultated Lungs-clear to auscultation bilaterally,  no wheezing or crackles auscultated Abdomen-soft, nontender, no organomegaly Extremities-no edema in the lower extremities Neuro-alert, oriented x3, no focal deficit noted   Status is: Inpatient:             Ozell Blunt   [facility] If 7PM-7AM, please contact night-coverage at [email or web address], Office  [phone]   09/19/2023, 4:31 PM  LOS: 2 days

## 2023-09-19 NOTE — Plan of Care (Signed)

## 2023-09-20 DIAGNOSIS — N179 Acute kidney failure, unspecified: Secondary | ICD-10-CM | POA: Diagnosis not present

## 2023-09-20 DIAGNOSIS — E440 Moderate protein-calorie malnutrition: Secondary | ICD-10-CM | POA: Diagnosis not present

## 2023-09-20 DIAGNOSIS — R6510 Systemic inflammatory response syndrome (SIRS) of non-infectious origin without acute organ dysfunction: Secondary | ICD-10-CM | POA: Diagnosis not present

## 2023-09-20 DIAGNOSIS — K529 Noninfective gastroenteritis and colitis, unspecified: Secondary | ICD-10-CM | POA: Diagnosis not present

## 2023-09-20 LAB — BASIC METABOLIC PANEL WITH GFR
Anion gap: 11 (ref 5–15)
BUN: 29 mg/dL — ABNORMAL HIGH (ref 8–23)
CO2: 20 mmol/L — ABNORMAL LOW (ref 22–32)
Calcium: 8.8 mg/dL — ABNORMAL LOW (ref 8.9–10.3)
Chloride: 107 mmol/L (ref 98–111)
Creatinine, Ser: 0.9 mg/dL (ref 0.44–1.00)
GFR, Estimated: >60 mL/min (ref >60)
Glucose, Bld: 148 mg/dL — ABNORMAL HIGH (ref 70–99)
Potassium: 3.1 mmol/L — ABNORMAL LOW (ref 3.5–5.1)
Sodium: 138 mmol/L (ref 135–145)

## 2023-09-20 LAB — GLUCOSE, CAPILLARY
Glucose-Capillary: 128 mg/dL — ABNORMAL HIGH (ref 70–99)
Glucose-Capillary: 212 mg/dL — ABNORMAL HIGH (ref 70–99)
Glucose-Capillary: 151 mg/dL — ABNORMAL HIGH (ref 70–99)

## 2023-09-20 LAB — POTASSIUM: Potassium: 3.7 mmol/L (ref 3.5–5.1)

## 2023-09-20 MED ORDER — MAGNESIUM SULFATE IN D5W 1-5 GM/100ML-% IV SOLN
1.0000 g | Freq: Once | INTRAVENOUS | Status: AC
  Administered 2023-09-20: 1 g via INTRAVENOUS
  Filled 2023-09-20: qty 100

## 2023-09-20 MED ORDER — BANATROL TF EN LIQD: 60.0000 mL | Freq: Two times a day (BID) | ENTERAL | 0 refills | Status: AC

## 2023-09-20 MED ORDER — POTASSIUM CHLORIDE 10 MEQ/100ML IV SOLN
10.0000 meq | INTRAVENOUS | Status: AC
  Administered 2023-09-20 (×3): 10 meq via INTRAVENOUS
  Filled 2023-09-20 (×3): qty 100

## 2023-09-20 MED ORDER — AMLODIPINE BESYLATE 5 MG PO TABS: 5.0000 mg | ORAL_TABLET | Freq: Every day | ORAL | 2 refills | Status: DC

## 2023-09-20 MED ORDER — LOPERAMIDE HCL 2 MG PO CAPS: 2.0000 mg | ORAL_CAPSULE | ORAL | 0 refills | Status: AC | PRN

## 2023-09-20 MED ORDER — METOPROLOL TARTRATE 25 MG PO TABS: 25.0000 mg | ORAL_TABLET | Freq: Two times a day (BID) | ORAL | 2 refills | Status: DC

## 2023-09-20 NOTE — Plan of Care (Addendum)
 VSS. Patient given PRN Tylenol  for pain. LBM 5/24. No acute events overnight.  Problem: Fluid Volume: Goal: Ability to maintain a balanced intake and output will improve Outcome: Progressing   Problem: Metabolic: Goal: Ability to maintain appropriate glucose levels will improve Outcome: Progressing   Problem: Nutritional: Goal: Maintenance of adequate nutrition will improve Outcome: Progressing   Problem: Education: Goal: Knowledge of General Education information will improve Description: Including pain rating scale, medication(s)/side effects and non-pharmacologic comfort measures Outcome: Progressing   Problem: Health Behavior/Discharge Planning: Goal: Ability to manage health-related needs will improve Outcome: Progressing   Problem: Clinical Measurements: Goal: Ability to maintain clinical measurements within normal limits will improve Outcome: Progressing Goal: Will remain free from infection Outcome: Progressing   Problem: Nutrition: Goal: Adequate nutrition will be maintained Outcome: Progressing   Problem: Pain Managment: Goal: General experience of comfort will improve and/or be controlled Outcome: Progressing   Problem: Safety: Goal: Ability to remain free from injury will improve Outcome: Progressing

## 2023-09-20 NOTE — Plan of Care (Signed)

## 2023-09-20 NOTE — Discharge Summary (Signed)
 Physician Discharge Summary   Patient: Sabrina Eaton MRN: [id number] DOB: [birth year]  Admit date:     09/17/2023  Discharge date: 09/20/23  Discharge Physician: Ozell Blunt   PCP: Pcp, No   Recommendations at discharge:   Follow-up PCP in 1 week  Discharge Diagnoses: Principal Problem:   SIRS (systemic inflammatory response syndrome) (HCC) Active Problems:   AKI (acute kidney injury) (HCC)   Acute gastroenteritis   Sarcoidosis   Malnutrition of moderate degree   Essential hypertension   Hyperlipidemia   Primary open angle glaucoma of left eye, severe stage   Primary open angle glaucoma of right eye, mild stage   Type 2 diabetes mellitus (HCC)   Sarcoid uveitis of left eye   Pressure injury of skin  Resolved Problems:   * No resolved hospital problems. *  Hospital Course:  83 y.o. female with medical history significant of functional diarrhea, type 2 diabetes, gastritis, sarcoidosis primarily affecting the eyes, bilateral glaucoma, hypertension, hyperlipidemia, asthma, malnutrition presenting to the ED with diarrhea for the past week. , bicarb 12, glucose 293, creatinine 5.7 (baseline creatinine appears to be normal), BUN 178, anion gap 23.  Lipase 185.  Urinalysis negative for infection and without any ketonuria.  Lactic acid 2.2 x 2.  C. difficile quick screen negative.  Blood cultures ordered and collected.  GI panel pending.  Beta hydroxybutyric acid mildly elevated at 0.28.  Repeat BMP in the afternoon with sodium 134, bicarb 14, glucose 235, creatinine 5.02, BUN 169, anion gap 19.  CT abdomen pelvis without contrast without any acute intraabdominal abnormalities noted.     Assessment and Plan:  Acute gastroenteritis Hx of functional diarrhea -Diarrhea improved with Imodium and fiber supplement Banatrol Patient presenting with 1 week history of multiple loose watery stools, concerning for acute gastroenteritis.  She had a very similar presentation about a month and a  half ago. - C. difficile quick screen negative.  GI pathogen panel is negative.   -CT abdomen pelvis without any acute intra-abdominal abnormalities noted.  She received a dose of Rocephin  and metronidazole in the ED, will continue on admission for now until GI panel results. - IV antibiotics discontinued. - Diarrhea improved with liquid fiber and Imodium       Acute renal failure AGMA -Renal function improving, creatinine down to 0.9 -CO2 20 Baseline creatinine appears to be normal.  On admission, creatinine 5.7 with BUN 178.  Repeat BMP this afternoon showing mild improvement with creatinine 5.02 and BUN 169.  She does have an anion gap metabolic acidosis likely due to her acute renal failure.  Renal failure most likely secondary to significant dehydration from GI losses.  - Continue IV fluid rehydration -BUN/creatinine has improved. - Renal ultrasound showed bilateral kidney stone 3 mm and 4 mm which are nonobstructing. - Discussed with patient and her son.  They are aware of kidney stones and will follow-up with urology as outpatient.   Hypokalemia - Replete   Type 2 diabetes with hyperglycemia A1c 6.3% on 08/03/2023.     Continue metformin If diarrhea returns, she should stop metformin and try some other medication.  Discussed with patient's son. They will follow-up with PCP as outpatient   Hypertension Blood pressure has been elevated despite being on metoprolol  Added amlodipine 5 mg daily Continue metoprolol  25 mg twice a day  Hyperlipidemia - Resume home Zetia  10 mg daily and simvastatin  40 mg daily       Depression - Resume home Celexa  20 mg  daily   Uveitis Bilateral glaucoma - Resume home prednisolone , Liquifilm, dorzolamide , timolol  eyedrops - Continue following ophthalmology in the outpatient setting   Asthma -Chronic and stable, no wheezing on exam, saturating well on room air - Not on any inhalers for this at home   Sarcoidosis - Chronic and stable - Not  on any medications for this in the outpatient setting          Consultants:  Procedures performed:  Disposition: Home Diet recommendation:  Discharge Diet Orders (From admission, onward)     Start     Ordered   09/20/23 0000  Diet - low sodium heart healthy        09/20/23 1616           Regular diet DISCHARGE MEDICATION: Allergies as of 09/20/2023       Reactions   Dorzolamide  Itching   Brimonidine  Other (See Comments)   Timolol  Other (See Comments)        Medication List     TAKE these medications    acetaminophen  500 MG tablet Commonly known as: TYLENOL  Take 500 mg by mouth every 6 (six) hours as needed for mild pain.   amLODipine 5 MG tablet Commonly known as: NORVASC Take 1 tablet (5 mg total) by mouth daily. Start taking on: Sep 21, 2023   brimonidine  0.2 % ophthalmic solution Commonly known as: ALPHAGAN  Place 1 drop into the left eye in the morning and at bedtime.   celecoxib 200 MG capsule Commonly known as: CELEBREX Take 1 capsule by mouth daily.   citalopram  20 MG tablet Commonly known as: CELEXA  Take 20 mg by mouth daily.   dorzolamide  2 % ophthalmic solution Commonly known as: TRUSOPT  Place 1 drop into the left eye 2 (two) times daily.   doxylamine  (Sleep) 25 MG tablet Commonly known as: UNISOM  Take 25 mg by mouth at bedtime.   ezetimibe -simvastatin  10-40 MG tablet Commonly known as: VYTORIN  Take 1 tablet by mouth daily.   fiber supplement (BANATROL TF) liquid Take 60 mLs by mouth 2 (two) times daily.   Fish Oil 1000 MG Caps Take 1 capsule by mouth in the morning, at noon, and at bedtime.   loperamide 2 MG capsule Commonly known as: IMODIUM Take 1 capsule (2 mg total) by mouth as needed for diarrhea or loose stools.   metFORMIN 500 MG tablet Commonly known as: GLUCOPHAGE Take 500 mg by mouth in the morning, at noon, and at bedtime.   metoprolol  tartrate 25 MG tablet Commonly known as: LOPRESSOR  Take 1 tablet (25 mg  total) by mouth 2 (two) times daily. What changed:  medication strength how much to take when to take this   mupirocin ointment 2 % Commonly known as: BACTROBAN 1 Application at bedtime.   niacin  1000 MG CR tablet Commonly known as: NIASPAN  Take 1,000 mg by mouth daily.   prednisoLONE  acetate 1 % ophthalmic suspension Commonly known as: PRED FORTE  INSTILL 1 DROP IN THE LEFT EYE EVERY OTHER DAY   SYSTANE HYDRATION PF OP Place 4 drops into both eyes every evening.   timolol  0.5 % ophthalmic solution Commonly known as: BETIMOL  Place 1 drop into both eyes 2 (two) times daily.        Discharge Exam: Filed Weights   09/17/23 1145  Weight: 44.3 kg   General-appears in no acute distress Heart-S1-S2, regular, no murmur auscultated Lungs-clear to auscultation bilaterally, no wheezing or crackles auscultated Abdomen-soft, nontender, no organomegaly Extremities-no edema in the lower extremities Neuro-alert,  oriented x3, no focal deficit noted  Condition at discharge: good  The results of significant diagnostics from this hospitalization (including imaging, microbiology, ancillary and laboratory) are listed below for reference.   Imaging Studies: US  RENAL Result Date: 09/18/2023 EXAM: RETROPERITONEAL ULTRASOUND OF THE KIDNEYS AND URINARY BLADDER TECHNIQUE: Real-time ultrasonography of the retroperitoneum including the kidneys and urinary bladder was performed. COMPARISON: None CLINICAL HISTORY: [id number] AKI (acute kidney injury) (HCC) [id number]. AKI (acute kidney injury) (HCC) FINDINGS: RIGHT KIDNEY: The right kidney measures 9.5 x 3.4 x 4.6 cm. The volume is 76.0 cc. The renal parenchyma is somewhat echogenic, isoechoic to the index organ, the liver. Mild hydronephrosis is present. A 3 mm nonobstructing stone is present. LEFT KIDNEY: The left kidney measures 8.8 x 4.9 x 4.5 cm. Volume is 99.4 ml. Mild left-sided hydronephrosis is present. A 4 mm nonobstructing stone is present at the  lower pole of the left kidney. The renal parenchyma is mildly echogenic. BLADDER: Unremarkable appearance of the bladder. IMPRESSION: 1. Mild bilateral hydronephrosis. 2. Nonobstructing stones: 3 mm in the right kidney and 4 mm in the left kidney. 3. The kidneys are somewhat hyperechoic bilaterally. This is nonspecific, but can be seen in the setting of medical renal disease insufficiency. Electronically signed by: Audree Leas MD 09/18/2023 04:55 PM EDT RP Workstation: [id number]   CT ABDOMEN PELVIS WO CONTRAST Result Date: 09/17/2023 CLINICAL DATA:  Abdominal pain, acute, nonlocalized. EXAM: CT ABDOMEN AND PELVIS WITHOUT CONTRAST TECHNIQUE: Multidetector CT imaging of the abdomen and pelvis was performed following the standard protocol without IV contrast. RADIATION DOSE REDUCTION: This exam was performed according to the departmental dose-optimization program which includes automated exposure control, adjustment of the mA and/or kV according to patient size and/or use of iterative reconstruction technique. COMPARISON:  CT scan abdomen and pelvis from 08/02/2023. FINDINGS: Lower chest: There are areas of atelectasis/scarring and random nodules in the right lung lower lobe, which are nonspecific and may represent sequela of prior infection or inflammation. The lung bases are otherwise clear. No pleural effusion. The heart is normal in size. No pericardial effusion. There are coronary artery atherosclerotic calcifications, in keeping with coronary artery disease. Hepatobiliary: The liver is normal in size. Non-cirrhotic configuration. No suspicious mass. No intrahepatic or extrahepatic bile duct dilation. No calcified gallstones. Normal gallbladder wall thickness. No pericholecystic inflammatory changes. Pancreas: Evaluation of pancreas is markedly limited due to lack of oral and intravenous contrast and paucity of visceral fat. Spleen: Within normal limits. No focal lesion. Adrenals/Urinary Tract:  Redemonstration of low-attenuation thickening of bilateral adrenal glands without significant interval change. No suspicious nodule. Note is made of malascended right kidney. No suspicious renal mass within the limitations of this unenhanced exam. There are at least five, 4 mm or smaller nonobstructing calculi in the left kidney and at least six, 3 mm or smaller nonobstructing calculi in the right kidney. No hydroureteronephrosis or ureterolithiasis on either side. Urinary bladder is under distended, precluding optimal assessment. However, no large mass or stones identified. No perivesical fat stranding. Stomach/Bowel: No disproportionate dilation of the small or large bowel loops. No evidence of abnormal bowel wall thickening or inflammatory changes. The appendix is unremarkable. Vascular/Lymphatic: No ascites or pneumoperitoneum. No abdominal or pelvic lymphadenopathy, by size criteria. No aneurysmal dilation of the major abdominal arteries. There are mild peripheral atherosclerotic vascular calcifications of the aorta and its major branches. Reproductive: Not well evaluated on this exam. Normal uterus is not distinctly visualized. Other: The visualized soft tissues and abdominal wall are  unremarkable. Musculoskeletal: No suspicious osseous lesions. There are mild multilevel degenerative changes in the visualized spine. IMPRESSION: 1. No acute inflammatory process identified within the abdomen or pelvis. 2. Multiple bilateral nonobstructing renal calculi, as described above. No hydroureteronephrosis or ureterolithiasis on either side. 3. Multiple other nonacute observations, as described above. Aortic Atherosclerosis (ICD10-I70.0). Electronically Signed   By: Beula Brunswick M.D.   On: 09/17/2023 13:30    Microbiology: Results for orders placed or performed during the hospital encounter of 09/17/23  Blood culture (routine x 2)     Status: None (Preliminary result)   Collection Time: 09/17/23 12:09 PM    Specimen: BLOOD  Result Value Ref Range Status   Specimen Description   Final    BLOOD RIGHT ANTECUBITAL Performed at [facility], [address], [place], [place] [address]    Special Requests   Final    BOTTLES DRAWN AEROBIC AND ANAEROBIC Blood Culture adequate volume Performed at [facility], [address], [place], [place] [address]    Culture   Final    NO GROWTH 3 DAYS Performed at [facility], [address] [place], [place] [address]    Report Status PENDING  Incomplete  Blood culture (routine x 2)     Status: None (Preliminary result)   Collection Time: 09/17/23 12:14 PM   Specimen: BLOOD RIGHT WRIST  Result Value Ref Range Status   Specimen Description   Final    BLOOD RIGHT WRIST Performed at [facility], [address], [place], [place] [address]    Special Requests   Final    BOTTLES DRAWN AEROBIC AND ANAEROBIC Blood Culture adequate volume Performed at [facility], [address] [place], [place] [address]    Culture   Final    NO GROWTH 3 DAYS Performed at [facility], [address], [place], [place] [address]    Report Status PENDING  Incomplete  Gastrointestinal Panel by PCR , Stool     Status: None   Collection Time: 09/17/23 12:36 PM   Specimen: Stool  Result Value Ref Range Status   Campylobacter species NOT DETECTED NOT DETECTED Final   Plesimonas shigelloides NOT DETECTED NOT DETECTED Final   Salmonella species NOT DETECTED NOT DETECTED Final   Yersinia enterocolitica NOT DETECTED NOT DETECTED Final   Vibrio species NOT DETECTED NOT DETECTED Final   Vibrio cholerae NOT DETECTED NOT DETECTED Final   Enteroaggregative E coli (EAEC) NOT DETECTED NOT DETECTED Final   Enteropathogenic E coli (EPEC) NOT DETECTED NOT DETECTED Final   Enterotoxigenic E coli (ETEC) NOT DETECTED NOT DETECTED Final   Shiga like toxin producing E coli (STEC) NOT DETECTED NOT DETECTED Final    Shigella/Enteroinvasive E coli (EIEC) NOT DETECTED NOT DETECTED Final   Cryptosporidium NOT DETECTED NOT DETECTED Final   Cyclospora cayetanensis NOT DETECTED NOT DETECTED Final   Entamoeba histolytica NOT DETECTED NOT DETECTED Final   Giardia lamblia NOT DETECTED NOT DETECTED Final   Adenovirus F40/41 NOT DETECTED NOT DETECTED Final   Astrovirus NOT DETECTED NOT DETECTED Final   Norovirus GI/GII NOT DETECTED NOT DETECTED Final   Rotavirus A NOT DETECTED NOT DETECTED Final   Sapovirus (I, II, IV, and V) NOT DETECTED NOT DETECTED Final    Comment: Performed at [facility], [address] [place], [place] [address]  C Difficile Quick Screen w PCR reflex     Status: None   Collection Time: 09/17/23 12:36 PM  Specimen: Stool  Result Value Ref Range Status   C Diff antigen NEGATIVE NEGATIVE Final   C Diff toxin NEGATIVE NEGATIVE Final   C Diff interpretation No C. difficile detected.  Final    Comment: Performed at [facility], [address], [place], [place] [address]    Labs: CBC: Recent Labs  Lab 09/17/23 1152 09/18/23 0528 09/19/23 0652  WBC 29.0* 24.6* 26.2*  HGB 12.1 11.2* 11.7*  HCT 37.7 36.3 36.6  MCV 98.4 100.6* 97.1  PLT 296 244 225   Basic Metabolic Panel: Recent Labs  Lab 09/17/23 1152 09/17/23 1430 09/18/23 0528 09/19/23 0652 09/20/23 0630 09/20/23 1407  NA 131* 134* 137 140 138  --   K 4.7 4.3 3.6 3.0* 3.1* 3.7  CL 97* 101 113* 111 107  --   CO2 12* 14* 14* 19* 20*  --   GLUCOSE 293* 235* 190* 171* 148*  --   BUN 178* 169* 114* 49* 29*  --   CREATININE 5.70* 5.02* 2.68* 1.28* 0.90  --   CALCIUM 10.3 10.0 8.9 8.8* 8.8*  --   MG  --   --   --  1.8  --   --   PHOS  --   --  2.8  --   --   --    Liver Function Tests: Recent Labs  Lab 09/17/23 1152 09/18/23 0528 09/19/23 0652  AST 28  --  24  ALT 26  --  25  ALKPHOS 120  --  84  BILITOT <0.2  --  0.5  PROT 7.0  --  6.9  ALBUMIN 3.7 3.0* 3.0*   CBG: Recent Labs  Lab  09/19/23 1620 09/19/23 2041 09/20/23 0528 09/20/23 0722 09/20/23 1206  GLUCAP 141* 192* 128* 212* 151*    Discharge time spent: greater than 30 minutes.  Signed: Ozell Blunt, MD [facility] 09/20/2023

## 2023-09-20 NOTE — Progress Notes (Incomplete)
 [facility]  PROGRESS NOTE  Sabrina Eaton RUE:[id number] DOB: Feb 23, 1941 DOA: 09/17/2023 PCP: Pcp, No   Brief HPI:    83 y.o. female with medical history significant of functional diarrhea, type 2 diabetes, gastritis, sarcoidosis primarily affecting the eyes, bilateral glaucoma, hypertension, hyperlipidemia, asthma, malnutrition presenting to the ED with diarrhea for the past week. , bicarb 12, glucose 293, creatinine 5.7 (baseline creatinine appears to be normal), BUN 178, anion gap 23.  Lipase 185.  Urinalysis negative for infection and without any ketonuria.  Lactic acid 2.2 x 2.  C. difficile quick screen negative.  Blood cultures ordered and collected.  GI panel pending.  Beta hydroxybutyric acid mildly elevated at 0.28.  Repeat BMP in the afternoon with sodium 134, bicarb 14, glucose 235, creatinine 5.02, BUN 169, anion gap 19.  CT abdomen pelvis without contrast without any acute intraabdominal abnormalities noted.       Assessment/Plan:   Acute gastroenteritis Hx of functional diarrhea -Diarrhea improved with Imodium and fiber supplement Banatrol Patient presenting with 1 week history of multiple loose watery stools, concerning for acute gastroenteritis.  She had a very similar presentation about a month and a half ago. - C. difficile quick screen negative.  GI pathogen panel is negative.   -CT abdomen pelvis without any acute intra-abdominal abnormalities noted.  She received a dose of Rocephin  and metronidazole in the ED, will continue on admission for now until GI panel results. - IV antibiotics discontinued.     Acute renal failure AGMA -Renal function improving, creatinine down to 1.28 -CO2 19 Baseline creatinine appears to be normal.  On admission, creatinine 5.7 with BUN 178.  Repeat BMP this afternoon showing mild improvement with creatinine 5.02 and BUN 169.  She does have an anion gap metabolic acidosis likely due to her acute renal failure.  Renal failure most  likely secondary to significant dehydration from GI losses.  - Continue IV fluid rehydration -BUN/creatinine has improved. - Follow-up renal ultrasound - Trend kidney function closely - Strict I/O's, monitor urine output - Avoid nephrotoxic medications as able  Hypokalemia - Potassium is 3.0 - Replace potassium and follow BMP in a.m.    Type 2 diabetes with hyperglycemia A1c 6.3% on 08/03/2023.  Patient is on metformin 500 mg 3 times daily at home.   - Moderate SSI - Trend CBGs, goal 140-180 - Holding home metformin   Hypertension Blood pressures ranging in the 120s-150s/60s.  Patient is on metoprolol  tartrate 50 mg daily at home. - Resume home metoprolol  tartrate -Will add amlodipine 5 mg daily   Hyperlipidemia - Resume home Zetia  10 mg daily and simvastatin  40 mg daily   Moderate malnutrition -RD consulted -PT/OT eval -glucerna TID between meals   Depression - Resume home Celexa  20 mg daily   Uveitis Bilateral glaucoma - Resume home prednisolone , Liquifilm, dorzolamide , timolol  eyedrops - Continue following ophthalmology in the outpatient setting   Asthma -Chronic and stable, no wheezing on exam, saturating well on room air - Not on any inhalers for this at home   Sarcoidosis - Chronic and stable - Not on any medications for this in the outpatient setting      Medications     amLODipine  5 mg Oral Daily   atorvastatin  20 mg Oral Daily   citalopram   20 mg Oral Daily   dorzolamide   1 drop Left Eye BID   ezetimibe   10 mg Oral Daily   feeding supplement (GLUCERNA SHAKE)  237 mL Oral TID BM  fiber supplement (BANATROL TF)  60 mL Oral BID   heparin   5,000 Units Subcutaneous Q8H   insulin  aspart  0-15 Units Subcutaneous TID WC   metoprolol  tartrate  50 mg Oral Daily   multivitamin with minerals  1 tablet Oral Daily   polyvinyl alcohol   4 drop Both Eyes QHS   prednisoLONE  acetate  1 drop Both Eyes QODAY   sodium bicarbonate   650 mg Oral BID   timolol   1  drop Both Eyes BID     Data Reviewed:   CBG:  Recent Labs  Lab 09/19/23 1220 09/19/23 1620 09/19/23 2041 09/20/23 0528 09/20/23 0722  GLUCAP 157* 141* 192* 128* 212*    SpO2: 97 %    Vitals:   09/19/23 0434 09/19/23 1222 09/19/23 1913 09/20/23 0520  BP: (!) 167/77 (!) 170/84 (!) 165/72 (!) 155/82  Pulse: 100 90 95 94  Resp: 18 18 15 18   Temp: 98.9 F (37.2 C) 98 F (36.7 C) 98.4 F (36.9 C) 98.6 F (37 C)  TempSrc: Oral   Oral  SpO2: 97% 100% 97% 97%  Weight:      Height:          Data Reviewed:  Basic Metabolic Panel: Recent Labs  Lab 09/17/23 1152 09/17/23 1430 09/18/23 0528 09/19/23 0652 09/20/23 0630  NA 131* 134* 137 140 138  K 4.7 4.3 3.6 3.0* 3.1*  CL 97* 101 113* 111 107  CO2 12* 14* 14* 19* 20*  GLUCOSE 293* 235* 190* 171* 148*  BUN 178* 169* 114* 49* 29*  CREATININE 5.70* 5.02* 2.68* 1.28* 0.90  CALCIUM 10.3 10.0 8.9 8.8* 8.8*  MG  --   --   --  1.8  --   PHOS  --   --  2.8  --   --     CBC: Recent Labs  Lab 09/17/23 1152 09/18/23 0528 09/19/23 0652  WBC 29.0* 24.6* 26.2*  HGB 12.1 11.2* 11.7*  HCT 37.7 36.3 36.6  MCV 98.4 100.6* 97.1  PLT 296 244 225    LFT Recent Labs  Lab 09/17/23 1152 09/18/23 0528 09/19/23 0652  AST 28  --  24  ALT 26  --  25  ALKPHOS 120  --  84  BILITOT <0.2  --  0.5  PROT 7.0  --  6.9  ALBUMIN 3.7 3.0* 3.0*     Antibiotics: Anti-infectives (From admission, onward)    Start     Dose/Rate Route Frequency Ordered Stop   09/18/23 1000  cefTRIAXone  (ROCEPHIN ) 2 g in sodium chloride  0.9 % 100 mL IVPB        2 g 200 mL/hr over 30 Minutes Intravenous Every 24 hours 09/17/23 2007     09/17/23 2200  metroNIDAZOLE (FLAGYL) IVPB 500 mg        500 mg 100 mL/hr over 60 Minutes Intravenous Every 12 hours 09/17/23 2007     09/17/23 1300  cefTRIAXone  (ROCEPHIN ) 2 g in sodium chloride  0.9 % 100 mL IVPB        2 g 200 mL/hr over 30 Minutes Intravenous  Once 09/17/23 1258 09/17/23 1347   09/17/23 1300   metroNIDAZOLE (FLAGYL) IVPB 500 mg        500 mg 100 mL/hr over 60 Minutes Intravenous  Once 09/17/23 1258 09/17/23 1505        DVT prophylaxis: Heparin   Code Status: Full code  Family Communication: No family at bedside   CONSULTS    Subjective  Objective    Physical Examination:     Status is: Inpatient:             Ozell Blunt   [facility] If 7PM-7AM, please contact night-coverage at [email or web address], Office  [phone]   09/20/2023, 8:02 AM  LOS: 3 days

## 2023-09-22 LAB — CULTURE, BLOOD (ROUTINE X 2)
Culture: NO GROWTH
Culture: NO GROWTH
Special Requests: ADEQUATE
Special Requests: ADEQUATE

## 2023-09-27 DIAGNOSIS — E119 Type 2 diabetes mellitus without complications: Secondary | ICD-10-CM | POA: Diagnosis not present

## 2023-09-30 NOTE — Progress Notes (Signed)
 [facility] - Clinic Note  10/14/2023    CHIEF COMPLAINT Patient presents for Retina Follow Up   HISTORY OF PRESENT ILLNESS: NADA Sabrina Eaton is a 83 y.o. female who presents to the clinic today for:   HPI     Retina Follow Up   Patient presents with  Other.  In left eye.  This started 6 months ago.  I, the attending physician,  performed the HPI with the patient and updated documentation appropriately.        Comments   Patient here for 6 months retina follow up for sarcoid uveitis OS. Patient states vision not good. Had been in the hospital 2 times and was given drops once a day. It is suppose to be BID OS. No eye pain. Uses 4 different drops. Systane one of them. Allergy to Brimonidine .      Last edited by Ronelle Coffee, MD on 10/14/2023  8:37 AM.    Pt states her vision is worse, she has been hospitalized twice and hasn't been able to take care of her sarcoidosis like she normally does, she is seeing a pulmonologist now and has a breathing test in August   Referring physician: Thurman Flores, MD [address] [place],  [place] [address]  HISTORICAL INFORMATION:   Selected notes from the MEDICAL RECORD NUMBER Self referral -- transferring care from Dr. Seward Dao LEE:  Ocular Hx- PMH-    CURRENT MEDICATIONS: Current Outpatient Medications (Ophthalmic Drugs)  Medication Sig   dorzolamide  (TRUSOPT ) 2 % ophthalmic solution Place 1 drop into the left eye 2 (two) times daily.   Polyethyl Glycol-Propyl Glycol (SYSTANE HYDRATION PF OP) Place 4 drops into both eyes every evening.   prednisoLONE  acetate (PRED FORTE ) 1 % ophthalmic suspension INSTILL 1 DROP IN THE LEFT EYE EVERY OTHER DAY   timolol  (BETIMOL ) 0.5 % ophthalmic solution Place 1 drop into both eyes 2 (two) times daily.   brimonidine  (ALPHAGAN ) 0.2 % ophthalmic solution Place 1 drop into the left eye in the morning and at bedtime. (Patient not taking: Reported on 10/14/2023)   No  current facility-administered medications for this visit. (Ophthalmic Drugs)   Current Outpatient Medications (Other)  Medication Sig   acetaminophen  (TYLENOL ) 500 MG tablet Take 500 mg by mouth every 6 (six) hours as needed for mild pain.   amLODipine  (NORVASC ) 5 MG tablet Take 1 tablet (5 mg total) by mouth daily.   celecoxib (CELEBREX) 200 MG capsule Take 1 capsule by mouth daily.   citalopram  (CELEXA ) 20 MG tablet Take 20 mg by mouth daily.   doxylamine , Sleep, (UNISOM ) 25 MG tablet Take 25 mg by mouth at bedtime.   ezetimibe -simvastatin  (VYTORIN ) 10-40 MG tablet Take 1 tablet by mouth daily.   fiber supplement, BANATROL TF, liquid Take 60 mLs by mouth 2 (two) times daily.   loperamide  (IMODIUM ) 2 MG capsule Take 1 capsule (2 mg total) by mouth as needed for diarrhea or loose stools.   metFORMIN (GLUCOPHAGE) 500 MG tablet Take 500 mg by mouth in the morning, at noon, and at bedtime.   metoprolol  tartrate (LOPRESSOR ) 25 MG tablet Take 1 tablet (25 mg total) by mouth 2 (two) times daily.   niacin  (NIASPAN ) 1000 MG CR tablet Take 1,000 mg by mouth daily.   Omega-3 Fatty Acids (FISH OIL) 1000 MG CAPS Take 1 capsule by mouth in the morning, at noon, and at bedtime.   mupirocin ointment (BACTROBAN) 2 % 1 Application at bedtime. (Patient  not taking: Reported on 10/14/2023)   No current facility-administered medications for this visit. (Other)   REVIEW OF SYSTEMS: ROS   Positive for: Endocrine, Eyes Last edited by Sylvan Evener, COA on 10/14/2023  7:50 AM.     ALLERGIES Allergies  Allergen Reactions   Dorzolamide  Itching   Brimonidine  Other (See Comments)   Sulfa Antibiotics Diarrhea and Nausea And Vomiting   Timolol  Other (See Comments)   PAST MEDICAL HISTORY Past Medical History:  Diagnosis Date   Allergy    Anxiety    Arthritis    Asthma due to seasonal allergies    Cancer (HCC)    skin cancer - face - basal   Depression    DM (diabetes mellitus) (HCC)    Environmental  allergies    Glaucoma    History of kidney stones    1 small in each kidney - has had for years   Hyperlipidemia    Hypertension    Right clavicle fracture    Sarcoidosis    effecting the eyes   Past Surgical History:  Procedure Laterality Date   BRONCHOSCOPY      x 2 to determine if she has scarcoidosis   COLONOSCOPY W/ POLYPECTOMY     EYE SURGERY     iol implants   FRACTURE SURGERY     Right clavicle repair   ORIF CLAVICULAR FRACTURE Right 07/12/2019   Procedure: OPEN REDUCTION INTERNAL FIXATION (ORIF) CLAVICULAR FRACTURE;  Surgeon: Janeth Medicus, MD;  Location: [facility] OR;  Service: Orthopedics;  Laterality: Right;   FAMILY HISTORY History reviewed. No pertinent family history.  SOCIAL HISTORY Social History   Tobacco Use   Smoking status: Former    Current packs/day: 0.00    Types: Cigarettes    Quit date: 04/02/2023    Years since quitting: 0.5    Passive exposure: Past   Smokeless tobacco: Never   Tobacco comments:    Quit smoking in December 2024  Vaping Use   Vaping status: Never Used  Substance Use Topics   Alcohol  use: Never   Drug use: Not Currently       OPHTHALMIC EXAM:  Base Eye Exam     Visual Acuity (Snellen - Linear)       Right Left   Dist cc 20/20 -1 20/200   Dist ph cc  NI    Correction: Glasses         Tonometry (Tonopen, 7:47 AM)       Right Left   Pressure 11 07         Pupils       Dark Light Shape React APD   Right 3 2 Round Slow None   Left 3 2 Round Slow None         Visual Fields (Counting fingers)       Left Right    Full Full         Extraocular Movement       Right Left    Full, Ortho Full, Ortho         Neuro/Psych     Oriented x3: Yes   Mood/Affect: Normal         Dilation     Both eyes: 1.0% Mydriacyl, 2.5% Phenylephrine  @ 7:46 AM           Slit Lamp and Fundus Exam     External Exam       Right Left   External Normal trace periorbital edema  Slit Lamp  Exam       Right Left   Lids/Lashes Dermatochalasis - upper lid, mild MGD Dermatochalasis - upper lid, Ptosis   Conjunctiva/Sclera White and quiet White and quiet   Cornea arcus, well healed cataract wound, 2+ Punctate epithelial erosions, tear film debris arcus, early band K, 3+ fine Punctate epithelial erosions, irregular epi   Anterior Chamber deep, clear, narrow temporal angle deep and clear, no cell or flare   Iris Round and dilated Round and dilated   Lens Posterior chamber intraocular lens, 1-2+ Posterior capsular opacification non-central Posterior chamber intraocular lens   Anterior Vitreous Vitreous syneresis Vitreous syneresis, no cell         Fundus Exam       Right Left   Posterior Vitreous Posterior vitreous detachment Posterior vitreous detachment   Disc mild Pallor, Sharp rim, mild PPA 3+pallor, +cupping, very thin inferior rim, +PPA   C/D Ratio 0.6 0.95   Macula Flat, Blunted foveal reflex, Drusen, RPE mottling, rare MA, no edema Flat, Blunted foveal reflex, mild RPE mottling, No heme or edema   Vessels attenuated, mild tortuosity Severe attenuation, peripheral sclerosis, mild tortuosity   Periphery Attached, rare MA Attached, scattered, punched out atrophic lesions, No heme           Refraction     Wearing Rx       Sphere Cylinder Axis Add   Right +0.50 Sphere  +2.50   Left +1.00 +0.50 001 +2.50           IMAGING AND PROCEDURES  Imaging and Procedures for 10/14/2023  OCT, Retina - OU - Both Eyes       Right Eye Quality was good. Scan locations included subfoveal. Central Foveal Thickness: 218. Progression has been stable. Findings include normal foveal contour, no IRF, no SRF, retinal drusen (Rare drusen).   Left Eye Quality was poor. Scan locations included subfoveal. Central Foveal Thickness: 149. Progression has been stable. Findings include normal foveal contour, no IRF, no SRF, inner retinal atrophy, outer retinal atrophy (Diffuse atrophy  greatest non-centrally, patchy ellipsoid signal. ).   Notes *Images captured and stored on drive  Diagnosis / Impression:  NFP, no IRF/SRF OU OD: rare drusen OS: Diffuse atrophy greatest non-centrally, patchy ellipsoid signal.   Clinical management:  See below  Abbreviations: NFP - Normal foveal profile. CME - cystoid macular edema. PED - pigment epithelial detachment. IRF - intraretinal fluid. SRF - subretinal fluid. EZ - ellipsoid zone. ERM - epiretinal membrane. ORA - outer retinal atrophy. ORT - outer retinal tubulation. SRHM - subretinal hyper-reflective material. IRHM - intraretinal hyper-reflective material              ASSESSMENT/PLAN:    ICD-10-CM   1. 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 Essential hypertension  I10     3. Hypertensive retinopathy of both eyes  H35.033     4. Moderate nonproliferative diabetic retinopathy of both eyes without macular edema associated with type 2 diabetes mellitus (HCC)  E33.2951     5. Long term (current) use of oral hypoglycemic drugs  Z79.84     6. Secondary open-angle glaucoma  H40.50X0     7. Primary open angle glaucoma (POAG) of right eye, mild stage  H40.1111     8. Pseudophakia of both eyes  Z96.1      1. History of sarcoid uveitis, left eye  - per pt report, onset ~late  1990s  - history of lung biopsy proven sarcoid in 1998 -- but pt has never been managed or treated systemically  - uveitis diagnosed and managed by Dr. Seward Dao  - has been inactive for quite some time  - currently on predForte 1 drop, every other day, for maintenance  - pt reports recurrence of inflammation when she last tried to stop PredForte  - no active inflammation on exam or OCT today  - FA 12.15.23 without inflammation or leakage  - repeat FA 07.17.24 showed OD: Scattered peripheral MA -- mild, No leakage, no NV, OS: Severe vascular perfusion defect 360 periphery extending posteriorly to arcades. No leakage or  vasculitis OU  - continue PredForte 1 drop every other day OS for maintenance  - f/u 6-9 mos, sooner prn - DFE/OCT  - pt had appt with Dr. Gina Lagos (pulmonologist) on September 18th, but he did not recommend steroids at that time due to her age  - now established w/ [facility] -- checking high res CT and PFTs  2,3. Hypertensive retinopathy OU - discussed importance of tight BP control - monitor  4,5. Mild nonproliferative diabetic retinopathy w/o DME, both eyes - The incidence, risk factors for progression, natural history and treatment options for diabetic retinopathy were discussed with patient.   - The need for close monitoring of blood glucose, blood pressure, and serum lipids, avoiding cigarette or any type of tobacco, and the need for long term follow up was also discussed with patient. - exam with rare MA OU  - FA (12.12.23) shows OD: Scattered peripheral MA -- mild, No leakage, no NV; OS: No leakage -- no active inflammation or vasculitis - repeat FA 07.17.24 showed OD: Scattered peripheral MA -- mild, No leakage, no NV, OS: Severe vascular perfusion defect 360 periphery extending posteriorly to arcades. No leakage or vasculitis OU - OCT without diabetic macular edema, both eyes  - f/u in 6-9 mos -- DFE/OCT  6,7. Secondary open angle glaucoma OS, POAG OD  - under the expert care of Dr. Leanor Proper  - Pt presented on 7.17.24 acutely for increased blurriness and decreased superior visual field in OS. HVF 24-2 was performed showing severe superior altitudinal defect  - BCVA OS 20/200 from 20/150  - IOP 11,07  - review of notes from Dr. Leanor Proper indicate presence of superior altitudinal defect on multiple HVFs - Timolol  BID OU - Dorzolamide  BID OS  - Brimonidine  BID OS  - discussed findings and possibility of glaucoma progression driving visual symptoms - recommend f/u with Dr. Leanor Proper for further eval  8. Pseudophakia OU  - s/p CE/IOL OU  - IOL in good position, doing well  -  monitor  Ophthalmic Meds Ordered this visit:  No orders of the defined types were placed in this encounter.    Return for f/u 6-9 months, sarcoid uveitis OS, DFE, OCT.  There are no Patient Instructions on file for this visit.   Explained the diagnoses, plan, and follow up with the patient and they expressed understanding.  Patient expressed understanding of the importance of proper follow up care.    This document serves as a record of services personally performed by Jeanice Millard, MD, PhD. It was created on their behalf by Angelia Kelp, an ophthalmic technician. The creation of this record is the provider's dictation and/or activities during the visit.    Electronically signed by: Angelia Kelp, OA, 10/14/23  8:37 AM  This document serves as a record of services personally performed by  Jeanice Millard, MD, PhD. It was created on their behalf by Morley Arabia. Bevin Bucks, OA an ophthalmic technician. The creation of this record is the provider's dictation and/or activities during the visit.    Electronically signed by: Morley Arabia. Bevin Bucks, OA 10/14/23 8:37 AM   Jeanice Millard, M.D., Ph.D. Diseases & Surgery of the Retina and Vitreous [facility]  I have reviewed the above documentation for accuracy and completeness, and I agree with the above. Jeanice Millard, M.D., Ph.D. 10/14/23 8:41 AM   Abbreviations: M myopia (nearsighted); A astigmatism; H hyperopia (farsighted); P presbyopia; Mrx spectacle prescription;  CTL contact lenses; OD right eye; OS left eye; OU both eyes  XT exotropia; ET esotropia; PEK punctate epithelial keratitis; PEE punctate epithelial erosions; DES dry eye syndrome; MGD meibomian gland dysfunction; ATs artificial tears; PFAT's preservative free artificial tears; NSC nuclear sclerotic cataract; PSC posterior subcapsular cataract; ERM epi-retinal membrane; PVD posterior vitreous detachment; RD retinal detachment; DM diabetes mellitus; DR diabetic  retinopathy; NPDR non-proliferative diabetic retinopathy; PDR proliferative diabetic retinopathy; CSME clinically significant macular edema; DME diabetic macular edema; dbh dot blot hemorrhages; CWS cotton wool spot; POAG primary open angle glaucoma; C/D cup-to-disc ratio; HVF humphrey visual field; GVF goldmann visual field; OCT optical coherence tomography; IOP intraocular pressure; BRVO Branch retinal vein occlusion; CRVO central retinal vein occlusion; CRAO central retinal artery occlusion; BRAO branch retinal artery occlusion; RT retinal tear; SB scleral buckle; PPV pars plana vitrectomy; VH Vitreous hemorrhage; PRP panretinal laser photocoagulation; IVK intravitreal kenalog; VMT vitreomacular traction; MH Macular hole;  NVD neovascularization of the disc; NVE neovascularization elsewhere; AREDS age related eye disease study; ARMD age related macular degeneration; POAG primary open angle glaucoma; EBMD epithelial/anterior basement membrane dystrophy; ACIOL anterior chamber intraocular lens; IOL intraocular lens; PCIOL posterior chamber intraocular lens; Phaco/IOL phacoemulsification with intraocular lens placement; PRK photorefractive keratectomy; LASIK laser assisted in situ keratomileusis; HTN hypertension; DM diabetes mellitus; COPD chronic obstructive pulmonary disease

## 2023-10-02 ENCOUNTER — Ambulatory Visit (INDEPENDENT_AMBULATORY_CARE_PROVIDER_SITE_OTHER): Admitting: Family Medicine

## 2023-10-02 ENCOUNTER — Encounter (HOSPITAL_BASED_OUTPATIENT_CLINIC_OR_DEPARTMENT_OTHER): Payer: Self-pay | Admitting: *Deleted

## 2023-10-02 ENCOUNTER — Encounter (HOSPITAL_BASED_OUTPATIENT_CLINIC_OR_DEPARTMENT_OTHER): Payer: Self-pay | Admitting: Family Medicine

## 2023-10-02 VITALS — BP 124/57 | HR 73 | Ht 60.0 in | Wt 95.5 lb

## 2023-10-02 DIAGNOSIS — D869 Sarcoidosis, unspecified: Secondary | ICD-10-CM | POA: Diagnosis not present

## 2023-10-02 DIAGNOSIS — D649 Anemia, unspecified: Secondary | ICD-10-CM | POA: Insufficient documentation

## 2023-10-02 DIAGNOSIS — E1165 Type 2 diabetes mellitus with hyperglycemia: Secondary | ICD-10-CM | POA: Diagnosis not present

## 2023-10-02 DIAGNOSIS — E785 Hyperlipidemia, unspecified: Secondary | ICD-10-CM | POA: Diagnosis not present

## 2023-10-02 DIAGNOSIS — I10 Essential (primary) hypertension: Secondary | ICD-10-CM | POA: Diagnosis not present

## 2023-10-02 NOTE — Assessment & Plan Note (Signed)
 Recent A1c shows good control.  Can continue with current regimen, reviewed lifestyle modifications.  She does have glucometer at home, advised on checking as needed, they do not need to be checking blood sugar regularly if they would prefer to avoid regular checks

## 2023-10-02 NOTE — Assessment & Plan Note (Signed)
 Noted during recent hospitalization, was mild, previously hemoglobin was at baseline We can plan to recheck labs today for monitoring

## 2023-10-02 NOTE — Assessment & Plan Note (Signed)
 Primarily ocular involvement, recommend continued follow-up with specialist

## 2023-10-02 NOTE — Assessment & Plan Note (Signed)
 Currently tolerating ezetimibe -simvastatin , can continue with medication.  Will plan to monitor lipid panel in the future

## 2023-10-02 NOTE — Assessment & Plan Note (Signed)
 Blood pressure appropriate in office today, currently doing well with current medication regimen, can continue at this time.  Recommend intermittent monitoring of blood pressure at home, DASH diet

## 2023-10-02 NOTE — Patient Instructions (Signed)
  Medication Instructions:  Your physician recommends that you continue on your current medications as directed. Please refer to the Current Medication list given to you today. --If you need a refill on any your medications before your next appointment, please call your pharmacy first. If no refills are authorized on file call the office.-- Lab Work: Your physician has recommended that you have lab work today: today  If you have labs (blood work) drawn today and your tests are completely normal, you will receive your results via MyChart message OR a phone call from our staff.  Please ensure you check your voicemail in the event that you authorized detailed messages to be left on a delegated number. If you have any lab test that is abnormal or we need to change your treatment, we will call you to review the results.    Follow-Up: Your next appointment:   Your physician recommends that you schedule a follow-up appointment in: 3-4 months follow up with Dr. de [place]  You will receive a text message or e-mail with a link to a survey about your care and experience with [place] today! We would greatly appreciate your feedback!   Thanks for letting us be apart of your health journey!!  Primary Care and Sports Medicine   Dr. Ceasar Mons [place]   We encourage you to activate your patient portal called "MyChart".  Sign up information is provided on this After Visit Summary.  MyChart is used to connect with patients for Virtual Visits (Telemedicine).  Patients are able to view lab/test results, encounter notes, upcoming appointments, etc.  Non-urgent messages can be sent to your provider as well. To learn more about what you can do with MyChart, please visit --  [email or web address].

## 2023-10-02 NOTE — Progress Notes (Signed)
 New Patient Office Visit  Subjective   Patient ID: Sabrina Eaton, female    DOB: 09/01/1940  Age: 83 y.o. MRN: [id number]  CC:  Chief Complaint  Patient presents with   New Patient (Initial Visit)    New Patient was seeing [facility] was discharged from hospital 5/25 was in for diarrhea and no appetite     HPI Sabrina Eaton presents to establish care Last PCP - Birt Bulla  HTN: Currently taking amlodipine  and metoprolol , denies any issues with medication.  Does check blood pressure at home with assistance of his son.  Blood pressure has been appropriate recently  HLD: Manages with ezetimibe -simvastatin .  Denies any issues with medication.  No reported side effects, no myalgias.  DM: taking metformin, has been on this for a number of years.  Most recent A1c has been controlled.  She has also been focusing on lifestyle modifications, son has assisted with this.  Was able to start back on metformin after recent hospitalization, denies any significant GI symptoms currently.  Patient was recently admitted for acute gastroenteritis, notable dehydration, acute kidney injury.  She was discharged about 2 weeks ago.  Has been doing well at home since then.  This was her second hospitalization recently for similar issue.  No current GI symptoms.  Patient is originally from [place] . Has lived here since she was 4. She works for an [employer] - works half a day. She enjoys gardening, active in her school, does class reunions.  Outpatient Encounter Medications as of 10/02/2023  Medication Sig   acetaminophen  (TYLENOL ) 500 MG tablet Take 500 mg by mouth every 6 (six) hours as needed for mild pain.   amLODipine  (NORVASC ) 5 MG tablet Take 1 tablet (5 mg total) by mouth daily.   brimonidine  (ALPHAGAN ) 0.2 % ophthalmic solution Place 1 drop into the left eye in the morning and at bedtime.   celecoxib (CELEBREX) 200 MG capsule Take 1 capsule by mouth daily.    citalopram  (CELEXA ) 20 MG tablet Take 20 mg by mouth daily.   dorzolamide  (TRUSOPT ) 2 % ophthalmic solution Place 1 drop into the left eye 2 (two) times daily.   doxylamine , Sleep, (UNISOM ) 25 MG tablet Take 25 mg by mouth at bedtime.   ezetimibe -simvastatin  (VYTORIN ) 10-40 MG tablet Take 1 tablet by mouth daily.   fiber supplement, BANATROL TF, liquid Take 60 mLs by mouth 2 (two) times daily.   loperamide  (IMODIUM ) 2 MG capsule Take 1 capsule (2 mg total) by mouth as needed for diarrhea or loose stools.   metFORMIN (GLUCOPHAGE) 500 MG tablet Take 500 mg by mouth in the morning, at noon, and at bedtime.   metoprolol  tartrate (LOPRESSOR ) 25 MG tablet Take 1 tablet (25 mg total) by mouth 2 (two) times daily.   niacin  (NIASPAN ) 1000 MG CR tablet Take 1,000 mg by mouth daily.   Omega-3 Fatty Acids (FISH OIL) 1000 MG CAPS Take 1 capsule by mouth in the morning, at noon, and at bedtime.   Polyethyl Glycol-Propyl Glycol (SYSTANE HYDRATION PF OP) Place 4 drops into both eyes every evening.   prednisoLONE  acetate (PRED FORTE ) 1 % ophthalmic suspension INSTILL 1 DROP IN THE LEFT EYE EVERY OTHER DAY   timolol  (BETIMOL ) 0.5 % ophthalmic solution Place 1 drop into both eyes 2 (two) times daily.   mupirocin ointment (BACTROBAN) 2 % 1 Application at bedtime. (Patient not taking: Reported on 10/02/2023)   No facility-administered encounter medications on file as  of 10/02/2023.    Past Medical History:  Diagnosis Date   Allergy    Anxiety    Arthritis    Asthma due to seasonal allergies    Cancer (HCC)    skin cancer - face - basal   Depression    DM (diabetes mellitus) (HCC)    Environmental allergies    Glaucoma    History of kidney stones    1 small in each kidney - has had for years   Hyperlipidemia    Hypertension    Right clavicle fracture    Sarcoidosis    effecting the eyes    Past Surgical History:  Procedure Laterality Date   BRONCHOSCOPY      x 2 to determine if she has  scarcoidosis   COLONOSCOPY W/ POLYPECTOMY     EYE SURGERY     iol implants   FRACTURE SURGERY     Right clavicle repair   ORIF CLAVICULAR FRACTURE Right 07/12/2019   Procedure: OPEN REDUCTION INTERNAL FIXATION (ORIF) CLAVICULAR FRACTURE;  Surgeon: Janeth Medicus, MD;  Location: [facility] OR;  Service: Orthopedics;  Laterality: Right;    History reviewed. No pertinent family history.  Social History   Socioeconomic History   Marital status: Widowed    Spouse name: Not on file   Number of children: Not on file   Years of education: Not on file   Highest education level: Associate degree: occupational, Scientist, product/process development, or vocational program  Occupational History   Not on file  Tobacco Use   Smoking status: Former    Current packs/day: 0.00    Types: Cigarettes    Quit date: 04/02/2023    Years since quitting: 0.5    Passive exposure: Past   Smokeless tobacco: Never   Tobacco comments:    Quit smoking in December 2024  Vaping Use   Vaping status: Never Used  Substance and Sexual Activity   Alcohol  use: Never   Drug use: Not Currently   Sexual activity: Not Currently    Birth control/protection: None  Other Topics Concern   Not on file  Social History Narrative   Not on file   Social Drivers of Health   Financial Resource Strain: Patient Declined (10/01/2023)   Overall Financial Resource Strain (CARDIA)    Difficulty of Paying Living Expenses: Patient declined  Food Insecurity: No Food Insecurity (10/01/2023)   Hunger Vital Sign    Worried About Running Out of Food in the Last Year: Never true    Ran Out of Food in the Last Year: Never true  Transportation Needs: No Transportation Needs (10/01/2023)   PRAPARE - Administrator, Civil Service (Medical): No    Lack of Transportation (Non-Medical): No  Physical Activity: Insufficiently Active (10/01/2023)   Exercise Vital Sign    Days of Exercise per Week: 2 days    Minutes of Exercise per Session: 10 min  Stress:  Stress Concern Present (10/01/2023)   [employer] of Occupational Health - Occupational Stress Questionnaire    Feeling of Stress : Rather much  Social Connections: Unknown (10/01/2023)   Social Connection and Isolation Panel [NHANES]    Frequency of Communication with Friends and Family: Three times a week    Frequency of Social Gatherings with Friends and Family: More than three times a week    Attends Religious Services: Patient declined    Active Member of Clubs or Organizations: Yes    Attends Banker Meetings: 1 to 4 times  per year    Marital Status: Widowed  Recent Concern: Social Connections - Socially Isolated (09/17/2023)   Social Connection and Isolation Panel [NHANES]    Frequency of Communication with Friends and Family: Twice a week    Frequency of Social Gatherings with Friends and Family: Once a week    Attends Religious Services: Never    Database administrator or Organizations: No    Attends Banker Meetings: Never    Marital Status: Widowed  Intimate Partner Violence: Not At Risk (09/17/2023)   Humiliation, Afraid, Rape, and Kick questionnaire    Fear of Current or Ex-Partner: No    Emotionally Abused: No    Physically Abused: No    Sexually Abused: No    Objective   BP (!) 124/57 (BP Location: Left Arm, Patient Position: Sitting, Cuff Size: Small)   Pulse 73   Ht 5' (1.524 m)   Wt 95 lb 8 oz (43.3 kg)   SpO2 98%   BMI 18.65 kg/m   Physical Exam  83 year old female in no acute distress Cardiovascular exam with regular rate and rhythm, faint systolic murmur appreciated Lungs clear to auscultation bilaterally Abdomen with normal bowel sounds, soft, nontender, nondistended.  Assessment & Plan:   Type 2 diabetes mellitus with hyperglycemia, without long-term current use of insulin  (HCC) Assessment & Plan: Recent A1c shows good control.  Can continue with current regimen, reviewed lifestyle modifications.  She does have  glucometer at home, advised on checking as needed, they do not need to be checking blood sugar regularly if they would prefer to avoid regular checks  Orders: -     Microalbumin / creatinine urine ratio  Essential hypertension Assessment & Plan: Blood pressure appropriate in office today, currently doing well with current medication regimen, can continue at this time.  Recommend intermittent monitoring of blood pressure at home, DASH diet  Orders: -     CBC with Differential/Platelet -     Basic metabolic panel with GFR  Hyperlipidemia, unspecified hyperlipidemia type Assessment & Plan: Currently tolerating ezetimibe -simvastatin , can continue with medication.  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   Spent 51 minutes on this patient encounter, including preparation, chart review, face-to-face counseling with patient and coordination of care, and documentation of encounter   ___________________________________________ Samiyyah Moffa de [place], MD, ABFM, [facility]

## 2023-10-03 LAB — CBC WITH DIFFERENTIAL/PLATELET
Basophils Absolute: 0.1 10*3/uL (ref 0.0–0.2)
Basos: 1 %
EOS (ABSOLUTE): 0.2 10*3/uL (ref 0.0–0.4)
Eos: 2 %
Hematocrit: 32.9 % — ABNORMAL LOW (ref 34.0–46.6)
Hemoglobin: 10.4 g/dL — ABNORMAL LOW (ref 11.1–15.9)
Immature Grans (Abs): 0.1 10*3/uL (ref 0.0–0.1)
Immature Granulocytes: 1 %
Lymphocytes Absolute: 1.9 10*3/uL (ref 0.7–3.1)
Lymphs: 17 %
MCH: 31.6 pg (ref 26.6–33.0)
MCHC: 31.6 g/dL (ref 31.5–35.7)
MCV: 100 fL — ABNORMAL HIGH (ref 79–97)
Monocytes Absolute: 0.8 10*3/uL (ref 0.1–0.9)
Monocytes: 7 %
Neutrophils Absolute: 8.1 10*3/uL — ABNORMAL HIGH (ref 1.4–7.0)
Neutrophils: 72 %
Platelets: 473 10*3/uL — ABNORMAL HIGH (ref 150–450)
RBC: 3.29 x10E6/uL — ABNORMAL LOW (ref 3.77–5.28)
RDW: 13.2 % (ref 11.7–15.4)
WBC: 11.3 10*3/uL — ABNORMAL HIGH (ref 3.4–10.8)

## 2023-10-03 LAB — BASIC METABOLIC PANEL WITH GFR
BUN/Creatinine Ratio: 22 (ref 12–28)
BUN: 25 mg/dL (ref 8–27)
CO2: 22 mmol/L (ref 20–29)
Calcium: 9.8 mg/dL (ref 8.7–10.3)
Chloride: 98 mmol/L (ref 96–106)
Creatinine, Ser: 1.12 mg/dL — ABNORMAL HIGH (ref 0.57–1.00)
Glucose: 130 mg/dL — ABNORMAL HIGH (ref 70–99)
Potassium: 4.6 mmol/L (ref 3.5–5.2)
Sodium: 139 mmol/L (ref 134–144)
eGFR: 50 mL/min/{1.73_m2} — ABNORMAL LOW (ref >59)

## 2023-10-03 LAB — MICROALBUMIN / CREATININE URINE RATIO
Creatinine, Urine: 151.8 mg/dL
Microalb/Creat Ratio: 204 mg/g{creat} — ABNORMAL HIGH (ref 0–29)
Microalbumin, Urine: 309.4 ug/mL

## 2023-10-13 ENCOUNTER — Ambulatory Visit (HOSPITAL_BASED_OUTPATIENT_CLINIC_OR_DEPARTMENT_OTHER): Payer: Self-pay | Admitting: Family Medicine

## 2023-10-14 ENCOUNTER — Ambulatory Visit (INDEPENDENT_AMBULATORY_CARE_PROVIDER_SITE_OTHER): Payer: Medicare Other | Admitting: Ophthalmology

## 2023-10-14 ENCOUNTER — Encounter (INDEPENDENT_AMBULATORY_CARE_PROVIDER_SITE_OTHER): Payer: Self-pay | Admitting: Ophthalmology

## 2023-10-14 DIAGNOSIS — I10 Essential (primary) hypertension: Secondary | ICD-10-CM

## 2023-10-14 DIAGNOSIS — H401111 Primary open-angle glaucoma, right eye, mild stage: Secondary | ICD-10-CM

## 2023-10-14 DIAGNOSIS — H35033 Hypertensive retinopathy, bilateral: Secondary | ICD-10-CM | POA: Diagnosis not present

## 2023-10-14 DIAGNOSIS — D8683 Sarcoid iridocyclitis: Secondary | ICD-10-CM | POA: Diagnosis not present

## 2023-10-14 DIAGNOSIS — H4050X0 Glaucoma secondary to other eye disorders, unspecified eye, stage unspecified: Secondary | ICD-10-CM

## 2023-10-14 DIAGNOSIS — Z7984 Long term (current) use of oral hypoglycemic drugs: Secondary | ICD-10-CM

## 2023-10-14 DIAGNOSIS — Z961 Presence of intraocular lens: Secondary | ICD-10-CM

## 2023-10-14 DIAGNOSIS — E113393 Type 2 diabetes mellitus with moderate nonproliferative diabetic retinopathy without macular edema, bilateral: Secondary | ICD-10-CM | POA: Diagnosis not present

## 2023-10-22 ENCOUNTER — Encounter (HOSPITAL_BASED_OUTPATIENT_CLINIC_OR_DEPARTMENT_OTHER): Payer: Self-pay | Admitting: *Deleted

## 2023-10-27 DIAGNOSIS — E119 Type 2 diabetes mellitus without complications: Secondary | ICD-10-CM | POA: Diagnosis not present

## 2023-11-17 ENCOUNTER — Encounter (HOSPITAL_BASED_OUTPATIENT_CLINIC_OR_DEPARTMENT_OTHER): Payer: Self-pay

## 2023-11-17 ENCOUNTER — Ambulatory Visit (INDEPENDENT_AMBULATORY_CARE_PROVIDER_SITE_OTHER): Admitting: *Deleted

## 2023-11-17 DIAGNOSIS — Z0000 Encounter for general adult medical examination without abnormal findings: Secondary | ICD-10-CM | POA: Diagnosis not present

## 2023-11-17 NOTE — Patient Instructions (Signed)
 Sabrina Eaton , Thank you for taking time to come for your Medicare Wellness Visit. I appreciate your ongoing commitment to your health goals. Please review the following plan we discussed and let me know if I can assist you in the future.   Screening recommendations/referrals: Mammogram: Requested from [facility] [place] Bone Density: patient declined Recommended yearly ophthalmology/optometry visit for glaucoma screening and checkup Recommended yearly dental visit for hygiene and checkup  Vaccinations: Influenza vaccine: patient declined Pneumococcal vaccine: information provided Tdap vaccine: information provided Shingles vaccine: will discuss with Dr everitt [place]    Advanced directives: copy requested  Conditions/risks identified: falls  Next appointment: 1 year    Preventive Care 65 Years and Older, Female Preventive care refers to lifestyle choices and visits with your health care provider that can promote health and wellness. What does preventive care include? A yearly physical exam. This is also called an annual well check. Dental exams once or twice a year. Routine eye exams. Ask your health care provider how often you should have your eyes checked. Personal lifestyle choices, including: Daily care of your teeth and gums. Regular physical activity. Eating a healthy diet. Avoiding tobacco and drug use. Limiting alcohol  use. Practicing safe sex. Taking low-dose aspirin every day. Taking vitamin and mineral supplements as recommended by your health care provider. What happens during an annual well check? The services and screenings done by your health care provider during your annual well check will depend on your age, overall health, lifestyle risk factors, and family history of disease. Counseling  Your health care provider may ask you questions about your: Alcohol  use. Tobacco use. Drug use. Emotional well-being. Home and relationship well-being. Sexual  activity. Eating habits. History of falls. Memory and ability to understand (cognition). Work and work Astronomer. Reproductive health. Screening  You may have the following tests or measurements: Height, weight, and BMI. Blood pressure. Lipid and cholesterol levels. These may be checked every 5 years, or more frequently if you are over 14 years old. Skin check. Lung cancer screening. You may have this screening every year starting at age 61 if you have a 30-pack-year history of smoking and currently smoke or have quit within the past 15 years. Fecal occult blood test (FOBT) of the stool. You may have this test every year starting at age 31. Flexible sigmoidoscopy or colonoscopy. You may have a sigmoidoscopy every 5 years or a colonoscopy every 10 years starting at age 77. Hepatitis C blood test. Hepatitis B blood test. Sexually transmitted disease (STD) testing. Diabetes screening. This is done by checking your blood sugar (glucose) after you have not eaten for a while (fasting). You may have this done every 1-3 years. Bone density scan. This is done to screen for osteoporosis. You may have this done starting at age 19. Mammogram. This may be done every 1-2 years. Talk to your health care provider about how often you should have regular mammograms. Talk with your health care provider about your test results, treatment options, and if necessary, the need for more tests. Vaccines  Your health care provider may recommend certain vaccines, such as: Influenza vaccine. This is recommended every year. Tetanus, diphtheria, and acellular pertussis (Tdap, Td) vaccine. You may need a Td booster every 10 years. Zoster vaccine. You may need this after age 78. Pneumococcal 13-valent conjugate (PCV13) vaccine. One dose is recommended after age 16. Pneumococcal polysaccharide (PPSV23) vaccine. One dose is recommended after age 64. Talk to your health care provider about which  screenings and vaccines  you need and how often you need them. This information is not intended to replace advice given to you by your health care provider. Make sure you discuss any questions you have with your health care provider. Document Released: 05/11/2015 Document Revised: 01/02/2016 Document Reviewed: 02/13/2015 Elsevier Interactive Patient Education  2017 [employer].  Fall Prevention in the Home Falls can cause injuries. They can happen to people of all ages. There are many things you can do to make your home safe and to help prevent falls. What can I do on the outside of my home? Regularly fix the edges of walkways and driveways and fix any cracks. Remove anything that might make you trip as you walk through a door, such as a raised step or threshold. Trim any bushes or trees on the path to your home. Use bright outdoor lighting. Clear any walking paths of anything that might make someone trip, such as rocks or tools. Regularly check to see if handrails are loose or broken. Make sure that both sides of any steps have handrails. Any raised decks and porches should have guardrails on the edges. Have any leaves, snow, or ice cleared regularly. Use sand or salt on walking paths during winter. Clean up any spills in your garage right away. This includes oil or grease spills. What can I do in the bathroom? Use night lights. Install grab bars by the toilet and in the tub and shower. Do not use towel bars as grab bars. Use non-skid mats or decals in the tub or shower. If you need to sit down in the shower, use a plastic, non-slip stool. Keep the floor dry. Clean up any water that spills on the floor as soon as it happens. Remove soap buildup in the tub or shower regularly. Attach bath mats securely with double-sided non-slip rug tape. Do not have throw rugs and other things on the floor that can make you trip. What can I do in the bedroom? Use night lights. Make sure that you have a light by your bed that  is easy to reach. Do not use any sheets or blankets that are too big for your bed. They should not hang down onto the floor. Have a firm chair that has side arms. You can use this for support while you get dressed. Do not have throw rugs and other things on the floor that can make you trip. What can I do in the kitchen? Clean up any spills right away. Avoid walking on wet floors. Keep items that you use a lot in easy-to-reach places. If you need to reach something above you, use a strong step stool that has a grab bar. Keep electrical cords out of the way. Do not use floor polish or wax that makes floors slippery. If you must use wax, use non-skid floor wax. Do not have throw rugs and other things on the floor that can make you trip. What can I do with my stairs? Do not leave any items on the stairs. Make sure that there are handrails on both sides of the stairs and use them. Fix handrails that are broken or loose. Make sure that handrails are as long as the stairways. Check any carpeting to make sure that it is firmly attached to the stairs. Fix any carpet that is loose or worn. Avoid having throw rugs at the top or bottom of the stairs. If you do have throw rugs, attach them to the floor with carpet  tape. Make sure that you have a light switch at the top of the stairs and the bottom of the stairs. If you do not have them, ask someone to add them for you. What else can I do to help prevent falls? Wear shoes that: Do not have high heels. Have rubber bottoms. Are comfortable and fit you well. Are closed at the toe. Do not wear sandals. If you use a stepladder: Make sure that it is fully opened. Do not climb a closed stepladder. Make sure that both sides of the stepladder are locked into place. Ask someone to hold it for you, if possible. Clearly mark and make sure that you can see: Any grab bars or handrails. First and last steps. Where the edge of each step is. Use tools that help you  move around (mobility aids) if they are needed. These include: Canes. Walkers. Scooters. Crutches. Turn on the lights when you go into a dark area. Replace any light bulbs as soon as they burn out. Set up your furniture so you have a clear path. Avoid moving your furniture around. If any of your floors are uneven, fix them. If there are any pets around you, be aware of where they are. Review your medicines with your doctor. Some medicines can make you feel dizzy. This can increase your chance of falling. Ask your doctor what other things that you can do to help prevent falls. This information is not intended to replace advice given to you by your health care provider. Make sure you discuss any questions you have with your health care provider. Document Released: 02/08/2009 Document Revised: 09/20/2015 Document Reviewed: 05/19/2014 Elsevier Interactive Patient Education  2017 [employer].

## 2023-11-17 NOTE — Progress Notes (Signed)
 Subjective:   Sabrina Eaton is a 83 y.o. female who presents for Medicare Annual (Subsequent) preventive examination.  Visit Complete: Virtual I connected with  MEGHAM DWYER on 11/17/23 by a audio enabled telemedicine application and verified that I am speaking with the correct person using two identifiers.  Patient Location: Home  Provider Location: Office/Clinic  I discussed the limitations of evaluation and management by telemedicine. The patient expressed understanding and agreed to proceed.  Vital Signs: Because this visit was a virtual/telehealth visit, some criteria may be missing or patient reported. Any vitals not documented were not able to be obtained and vitals that have been documented are patient reported.  Patient Medicare AWV questionnaire was completed by the patient on 11/17/23; I have confirmed that all information answered by patient is correct and no changes since this date.        Objective:    There were no vitals filed for this visit. There is no height or weight on file to calculate BMI.     09/17/2023    4:36 PM 08/02/2023   10:25 AM 07/29/2022    5:15 AM 07/12/2019   11:18 AM 07/08/2019   12:18 PM 06/29/2019    2:08 PM  Advanced Directives  Does Patient Have a Medical Advance Directive? No No No Yes  Yes  Type of Advance Directive    Living will;Healthcare Power of [employer] Power of [place];Living will Healthcare Power of [place];Living will  Does patient want to make changes to medical advance directive?    No - Guardian declined  No - Patient declined  Copy of Healthcare Power of Attorney in Chart?    No - copy requested No - copy requested No - copy requested, Physician notified  Would patient like information on creating a medical advance directive? No - Patient declined No - Patient declined No - Patient declined       Current Medications (verified) Outpatient Encounter Medications as of 11/17/2023  Medication Sig   acetaminophen   (TYLENOL ) 500 MG tablet Take 500 mg by mouth every 6 (six) hours as needed for mild pain.   amLODipine  (NORVASC ) 5 MG tablet Take 1 tablet (5 mg total) by mouth daily.   brimonidine  (ALPHAGAN ) 0.2 % ophthalmic solution Place 1 drop into the left eye in the morning and at bedtime. (Patient not taking: Reported on 10/14/2023)   celecoxib (CELEBREX) 200 MG capsule Take 1 capsule by mouth daily.   citalopram  (CELEXA ) 20 MG tablet Take 20 mg by mouth daily.   dorzolamide  (TRUSOPT ) 2 % ophthalmic solution Place 1 drop into the left eye 2 (two) times daily.   doxylamine , Sleep, (UNISOM ) 25 MG tablet Take 25 mg by mouth at bedtime.   ezetimibe -simvastatin  (VYTORIN ) 10-40 MG tablet Take 1 tablet by mouth daily.   fiber supplement, BANATROL TF, liquid Take 60 mLs by mouth 2 (two) times daily.   loperamide  (IMODIUM ) 2 MG capsule Take 1 capsule (2 mg total) by mouth as needed for diarrhea or loose stools.   metFORMIN (GLUCOPHAGE) 500 MG tablet Take 500 mg by mouth in the morning, at noon, and at bedtime.   metoprolol  tartrate (LOPRESSOR ) 25 MG tablet Take 1 tablet (25 mg total) by mouth 2 (two) times daily.   mupirocin ointment (BACTROBAN) 2 % 1 Application at bedtime. (Patient not taking: Reported on 10/14/2023)   niacin  (NIASPAN ) 1000 MG CR tablet Take 1,000 mg by mouth daily.   Omega-3 Fatty Acids (FISH OIL) 1000 MG CAPS Take  1 capsule by mouth in the morning, at noon, and at bedtime.   Polyethyl Glycol-Propyl Glycol (SYSTANE HYDRATION PF OP) Place 4 drops into both eyes every evening.   prednisoLONE  acetate (PRED FORTE ) 1 % ophthalmic suspension INSTILL 1 DROP IN THE LEFT EYE EVERY OTHER DAY   timolol  (BETIMOL ) 0.5 % ophthalmic solution Place 1 drop into both eyes 2 (two) times daily.   No facility-administered encounter medications on file as of 11/17/2023.    Allergies (verified) Dorzolamide , Brimonidine , Sulfa antibiotics, and Timolol    History: Past Medical History:  Diagnosis Date   Allergy     Anxiety    Arthritis    Asthma due to seasonal allergies    Cancer (HCC)    skin cancer - face - basal   Depression    DM (diabetes mellitus) (HCC)    Environmental allergies    Glaucoma    History of kidney stones    1 small in each kidney - has had for years   Hyperlipidemia    Hypertension    Right clavicle fracture    Sarcoidosis    effecting the eyes   Past Surgical History:  Procedure Laterality Date   BRONCHOSCOPY      x 2 to determine if she has scarcoidosis   COLONOSCOPY W/ POLYPECTOMY     EYE SURGERY     iol implants   FRACTURE SURGERY     Right clavicle repair   ORIF CLAVICULAR FRACTURE Right 07/12/2019   Procedure: OPEN REDUCTION INTERNAL FIXATION (ORIF) CLAVICULAR FRACTURE;  Surgeon: Sharl Selinda Dover, MD;  Location: [facility] OR;  Service: Orthopedics;  Laterality: Right;   History reviewed. No pertinent family history. Social History   Socioeconomic History   Marital status: Widowed    Spouse name: Not on file   Number of children: Not on file   Years of education: Not on file   Highest education level: Associate degree: occupational, Scientist, product/process development, or vocational program  Occupational History   Not on file  Tobacco Use   Smoking status: Former    Current packs/day: 0.00    Types: Cigarettes    Quit date: 04/02/2023    Years since quitting: 0.6    Passive exposure: Past   Smokeless tobacco: Never   Tobacco comments:    Quit smoking in December 2024  Vaping Use   Vaping status: Never Used  Substance and Sexual Activity   Alcohol  use: Never   Drug use: Not Currently   Sexual activity: Not Currently    Birth control/protection: None  Other Topics Concern   Not on file  Social History Narrative   Not on file   Social Drivers of Health   Financial Resource Strain: Patient Declined (10/01/2023)   Overall Financial Resource Strain (CARDIA)    Difficulty of Paying Living Expenses: Patient declined  Food Insecurity: No Food Insecurity (10/01/2023)   Hunger  Vital Sign    Worried About Running Out of Food in the Last Year: Never true    Ran Out of Food in the Last Year: Never true  Transportation Needs: No Transportation Needs (10/01/2023)   PRAPARE - Administrator, Civil Service (Medical): No    Lack of Transportation (Non-Medical): No  Physical Activity: Insufficiently Active (10/01/2023)   Exercise Vital Sign    Days of Exercise per Week: 2 days    Minutes of Exercise per Session: 10 min  Stress: Stress Concern Present (10/01/2023)   [employer] of Occupational Health - Occupational Stress  Questionnaire    Feeling of Stress : Rather much  Social Connections: Unknown (10/01/2023)   Social Connection and Isolation Panel    Frequency of Communication with Friends and Family: Three times a week    Frequency of Social Gatherings with Friends and Family: More than three times a week    Attends Religious Services: Patient declined    Active Member of Clubs or Organizations: Yes    Attends Banker Meetings: 1 to 4 times per year    Marital Status: Widowed  Recent Concern: Social Connections - Socially Isolated (09/17/2023)   Social Connection and Isolation Panel    Frequency of Communication with Friends and Family: Twice a week    Frequency of Social Gatherings with Friends and Family: Once a week    Attends Religious Services: Never    Database administrator or Organizations: No    Attends Banker Meetings: Never    Marital Status: Widowed    Tobacco Counseling Counseling given: Not Answered Tobacco comments: Quit smoking in December 2024   Clinical Intake:                        Activities of Daily Living    09/17/2023    4:34 PM 09/17/2023    4:00 PM  In your present state of health, do you have any difficulty performing the following activities:  Hearing?  0  Vision?  0  Difficulty concentrating or making decisions?  0  Doing errands, shopping? 0     Patient Care Team: de  [place], Quintin PARAS, MD as PCP - General (Family Medicine)  Indicate any recent Medical Services you may have received from other than [facility] providers in the past year (date may be approximate).     Assessment:   This is a routine wellness examination for Sabrina Eaton.  Hearing/Vision screen No results found.   Goals Addressed   None    Depression Screen    10/02/2023    9:27 AM  PHQ 2/9 Scores  PHQ - 2 Score 0  PHQ- 9 Score 0    Fall Risk    10/02/2023    9:27 AM 09/08/2023   10:23 AM  Fall Risk   Falls in the past year? 0 0  Number falls in past yr: 0   Injury with Fall? 0   Risk for fall due to : No Fall Risks   Follow up Falls evaluation completed     MEDICARE RISK AT HOME:    TIMED UP AND GO:  Was the test performed?  No    Cognitive Function:        Immunizations  There is no immunization history on file for this patient.  TDAP status: Due, Education has been provided regarding the importance of this vaccine. Advised may receive this vaccine at local pharmacy or Health Dept. Aware to provide a copy of the vaccination record if obtained from local pharmacy or Health Dept. Verbalized acceptance and understanding.  Flu Vaccine status: Declined, Education has been provided regarding the importance of this vaccine but patient still declined. Advised may receive this vaccine at local pharmacy or Health Dept. Aware to provide a copy of the vaccination record if obtained from local pharmacy or Health Dept. Verbalized acceptance and understanding.  Pneumococcal vaccine status: Due, Education has been provided regarding the importance of this vaccine. Advised may receive this vaccine at local pharmacy or Health Dept. Aware to provide a copy  of the vaccination record if obtained from local pharmacy or Health Dept. Verbalized acceptance and understanding.  Covid-19 vaccine status: Declined, Education has been provided regarding the importance of this vaccine but patient still  declined. Advised may receive this vaccine at local pharmacy or Health Dept.or vaccine clinic. Aware to provide a copy of the vaccination record if obtained from local pharmacy or Health Dept. Verbalized acceptance and understanding.  Qualifies for Shingles Vaccine? Yes   Zostavax completed No   Shingrix Completed?: No.    Education has been provided regarding the importance of this vaccine. Patient has been advised to call insurance company to determine out of pocket expense if they have not yet received this vaccine. Advised may also receive vaccine at local pharmacy or Health Dept. Verbalized acceptance and understanding.  Screening Tests Health Maintenance  Topic Date Due   COVID-19 Vaccine (1) Never done   FOOT EXAM  Never done   DTaP/Tdap/Td (1 - Tdap) Never done   Pneumococcal Vaccine: 50+ Years (1 of 2 - PCV) Never done   Zoster Vaccines- Shingrix (1 of 2) Never done   DEXA SCAN  10/01/2024 (Originally 04/20/2008)   INFLUENZA VACCINE  11/27/2023   HEMOGLOBIN A1C  02/02/2024   OPHTHALMOLOGY EXAM  04/14/2024   Diabetic kidney evaluation - eGFR measurement  10/01/2024   Diabetic kidney evaluation - Urine ACR  10/01/2024   Medicare Annual Wellness (AWV)  11/16/2024   Hepatitis B Vaccines  Aged Out   HPV VACCINES  Aged Out   Meningococcal B Vaccine  Aged Out   Colonoscopy  Discontinued    Health Maintenance  Health Maintenance Due  Topic Date Due   COVID-19 Vaccine (1) Never done   FOOT EXAM  Never done   DTaP/Tdap/Td (1 - Tdap) Never done   Pneumococcal Vaccine: 50+ Years (1 of 2 - PCV) Never done   Zoster Vaccines- Shingrix (1 of 2) Never done    Colorectal cancer screening: No longer required.   Mammogram status: Completed 03/2023 at [facility]. Repeat every year  Bone denisty: Patient declined  Lung Cancer Screening: (Low Dose CT Chest recommended if Age 55-80 years, 20 pack-year currently smoking OR have quit w/in 15years.) does not qualify.    Lung Cancer Screening Referral: NA  Additional Screening:  Hepatitis C Screening: does qualify; Completed   Vision Screening: Recommended annual ophthalmology exams for early detection of glaucoma and other disorders of the eye. Is the patient up to date with their annual eye exam?  Yes  Who is the provider or what is the name of the office in which the patient attends annual eye exams? Dr Zamorra If pt is not established with a provider, would they like to be referred to a provider to establish care? No .   Dental Screening: Recommended annual dental exams for proper oral hygiene  Diabetic Foot Exam: Diabetic Foot Exam: Overdue, Pt has been advised about the importance in completing this exam. Pt is scheduled for diabetic foot exam on next visit .  Community Resource Referral / Chronic Care Management: CRR required this visit?  No   CCM required this visit?  No     Plan:     I have personally reviewed and noted the following in the patient's chart:   Medical and social history Use of alcohol , tobacco or illicit drugs  Current medications and supplements including opioid prescriptions. Patient is not currently taking opioid prescriptions. Functional ability and status Nutritional status Physical activity Advanced  directives List of other physicians Hospitalizations, surgeries, and ER visits in previous 12 months Vitals Screenings to include cognitive, depression, and falls Referrals and appointments  In addition, I have reviewed and discussed with patient certain preventive protocols, quality metrics, and best practice recommendations. A written personalized care plan for preventive services as well as general preventive health recommendations were provided to patient.     Kerri JONELLE Fuel, CMA   11/17/2023   After Visit Summary: (MyChart) Due to this being a telephonic visit, the after visit summary with patients personalized plan was offered to patient via MyChart    Nurse Notes:  Ms. Mcclaran , Thank you for taking time to come for your Medicare Wellness Visit. I appreciate your ongoing commitment to your health goals. Please review the following plan we discussed and let me know if I can assist you in the future.   These are the goals we discussed:  Goals      Patient Stated     Would like to stay healthy and stay out of the hospital         This is a list of the screening recommended for you and due dates:  Health Maintenance  Topic Date Due   COVID-19 Vaccine (1) Never done   Complete foot exam   Never done   DTaP/Tdap/Td vaccine (1 - Tdap) Never done   Pneumococcal Vaccine for age over 90 (1 of 2 - PCV) Never done   Zoster (Shingles) Vaccine (1 of 2) Never done   DEXA scan (bone density measurement)  10/01/2024*   Flu Shot  11/27/2023   Hemoglobin A1C  02/02/2024   Eye exam for diabetics  04/14/2024   Yearly kidney function blood test for diabetes  10/01/2024   Yearly kidney health urinalysis for diabetes  10/01/2024   Medicare Annual Wellness Visit  11/16/2024   Hepatitis B Vaccine  Aged Out   HPV Vaccine  Aged Out   Meningitis B Vaccine  Aged Out   Colon Cancer Screening  Discontinued  *Topic was postponed. The date shown is not the original due date.

## 2023-11-18 ENCOUNTER — Ambulatory Visit
Admission: RE | Admit: 2023-11-18 | Discharge: 2023-11-18 | Disposition: A | Discharge status: Home / Self Care | Source: Ambulatory Visit | Attending: Pulmonary Disease | Admitting: Pulmonary Disease

## 2023-11-18 DIAGNOSIS — D869 Sarcoidosis, unspecified: Secondary | ICD-10-CM

## 2023-11-18 DIAGNOSIS — I517 Cardiomegaly: Secondary | ICD-10-CM | POA: Diagnosis not present

## 2023-11-18 DIAGNOSIS — I2510 Atherosclerotic heart disease of native coronary artery without angina pectoris: Secondary | ICD-10-CM | POA: Diagnosis not present

## 2023-11-18 DIAGNOSIS — R918 Other nonspecific abnormal finding of lung field: Secondary | ICD-10-CM | POA: Diagnosis not present

## 2023-11-26 ENCOUNTER — Ambulatory Visit: Payer: Self-pay | Admitting: Pulmonary Disease

## 2023-11-26 MED ORDER — AMOXICILLIN-POT CLAVULANATE 875-125 MG PO TABS: 1.0000 | ORAL_TABLET | Freq: Two times a day (BID) | ORAL | 0 refills | Status: DC

## 2023-11-27 DIAGNOSIS — E119 Type 2 diabetes mellitus without complications: Secondary | ICD-10-CM | POA: Diagnosis not present

## 2023-11-28 ENCOUNTER — Encounter (HOSPITAL_BASED_OUTPATIENT_CLINIC_OR_DEPARTMENT_OTHER): Payer: Self-pay | Admitting: Family Medicine

## 2023-11-30 NOTE — Telephone Encounter (Signed)
 Spoke with patient she already has something and no longer needs the gabapentin

## 2023-12-21 ENCOUNTER — Encounter: Payer: Self-pay | Admitting: Pulmonary Disease

## 2023-12-21 ENCOUNTER — Ambulatory Visit: Admitting: Pulmonary Disease

## 2023-12-21 VITALS — BP 132/80 | HR 66 | Temp 98.8°F | Ht 60.0 in | Wt 101.0 lb

## 2023-12-21 DIAGNOSIS — J849 Interstitial pulmonary disease, unspecified: Secondary | ICD-10-CM | POA: Diagnosis not present

## 2023-12-21 DIAGNOSIS — Z87891 Personal history of nicotine dependence: Secondary | ICD-10-CM | POA: Diagnosis not present

## 2023-12-21 DIAGNOSIS — D869 Sarcoidosis, unspecified: Secondary | ICD-10-CM | POA: Diagnosis not present

## 2023-12-21 DIAGNOSIS — R918 Other nonspecific abnormal finding of lung field: Secondary | ICD-10-CM

## 2023-12-21 LAB — PULMONARY FUNCTION TEST
DL/VA % pred: 64 %
DL/VA: 2.73 ml/min/mmHg/L
DLCO unc % pred: 56 %
DLCO unc: 9.2 ml/min/mmHg
FEF 25-75 Post: 0.92 L/s
FEF 25-75 Pre: 0.56 L/s
FEF2575-%Change-Post: 64 %
FEF2575-%Pred-Post: 77 %
FEF2575-%Pred-Pre: 47 %
FEV1-%Change-Post: 12 %
FEV1-%Pred-Post: 85 %
FEV1-%Pred-Pre: 76 %
FEV1-Post: 1.35 L
FEV1-Pre: 1.2 L
FEV1FVC-%Change-Post: 0 %
FEV1FVC-%Pred-Pre: 87 %
FEV6-%Change-Post: 14 %
FEV6-%Pred-Post: 103 %
FEV6-%Pred-Pre: 90 %
FEV6-Post: 2.07 L
FEV6-Pre: 1.82 L
FEV6FVC-%Change-Post: 1 %
FEV6FVC-%Pred-Post: 105 %
FEV6FVC-%Pred-Pre: 103 %
FVC-%Change-Post: 12 %
FVC-%Pred-Post: 97 %
FVC-%Pred-Pre: 87 %
FVC-Post: 2.09 L
FVC-Pre: 1.86 L
Post FEV1/FVC ratio: 65 %
Post FEV6/FVC ratio: 99 %
Pre FEV1/FVC ratio: 64 %
Pre FEV6/FVC Ratio: 97 %
RV % pred: 260 %
RV: 5.71 L
TLC % pred: 174 %
TLC: 7.76 L

## 2023-12-21 NOTE — Patient Instructions (Signed)
  VISIT SUMMARY: You had a follow-up appointment today to discuss your lung inflammation and other ongoing health issues. We reviewed your recent CT scan, your history of sarcoidosis, and your medication allergies. We also discussed your recent hospitalizations and current functional status.  YOUR PLAN: -PULMONARY INFLAMMATION POST-PNEUMONIA: Your recent CT scan showed inflammation in your lower lungs, likely due to mild pneumonia. Since the Augmentin  did not significantly improve your symptoms, we will monitor the inflammation with a follow-up chest CT in six months. We have also documented your sulfa drug allergy in your medical records.  -SARCOIDOSIS WITH OCULAR AND SINUS INVOLVEMENT: Sarcoidosis is a condition where inflammatory cells grow in different parts of your body. Your sarcoidosis affects your eyes and sinuses but not your lungs. You should continue using your eye drops and nasal flushes as prescribed. Please discuss the sulfa content in your eye drops with your eye doctor.  -CHRONIC SINUSITIS: Chronic sinusitis is a long-term inflammation of the sinuses. You should continue with your nasal flushes twice daily to manage this condition.  -POSSIBLE ASTHMA: Asthma is a condition where your airways narrow and swell, making it hard to breathe. Although you do not have current symptoms, you should keep your inhaler for emergency use only.  INSTRUCTIONS: Please schedule a follow-up chest CT in six months to assess the resolution of your lung inflammation. Continue with your current eye drops and nasal flushes, and discuss the sulfa content in your eye drops with your eye doctor. Keep your inhaler for emergency use only.

## 2023-12-21 NOTE — Patient Instructions (Signed)
 Full pft performed today

## 2023-12-21 NOTE — Progress Notes (Signed)
 Full pft performed today

## 2023-12-21 NOTE — Progress Notes (Signed)
 "              Sabrina Eaton    [id number]    1940-10-17  Primary Care Physician:de [place], Sabrina PARAS, MD  Referring Physician: Eragon Hammond, MD [address] [place],  [place] [address]  Chief complaint: Follow-up for sarcoidosis  HPI: 83 y.o. who  has a past medical history of Allergy, Anxiety, Arthritis, Asthma due to seasonal allergies, Cancer (HCC), Depression, DM (diabetes mellitus) (HCC), Environmental allergies, Glaucoma, History of kidney stones, Hyperlipidemia, Hypertension, Right clavicle fracture, and Sarcoidosis.  Discussed the use of AI scribe software for clinical note transcription with the patient, who gave verbal consent to proceed.  History of Present Illness Sabrina Eaton is an 83 year old female with sarcoidosis who presents for pulmonary evaluation following a recent hospitalization for pneumonia. She is accompanied by her son, Sabrina Eaton.   She has a history of sarcoidosis diagnosed in 1998. Available records she underwent a bronchoscopy which was nondiagnostic and then underwent mediastinoscopy which confirmed sarcoidosis.  There is no other significant lung involvement.  Sarcoidosis initially affecting her left eye and later involving her sinuses.  She was briefly on prednisone after diagnosis her sinus issues are managed with a twice-daily nasal flush using a solution containing Johnson's baby shampoo. Her eye condition is managed with prednisone eye drops, used every other day.   In 2024, she was evaluated by a pulmonologist at [facility], where a CT scan did not show any cancer or significant changes compared to previous scans. Recently, she was hospitalized for dehydration and community-acquired pneumonia, treated with ceftriaxone  and azithromycin . During this hospitalization, her liver tests were abnormal but improved with hydration. She has diabetes, which she attributes to long-term steroid use for her eye condition, and glaucoma, also linked to steroid use.  She  has a significant smoking history, having smoked a pack a day since her teens until she quit after Christmas 2024 due to breathing difficulties. She reports improvement in her breathing since quitting. She works part-time at a [employer] and previously taught pre-K for 20 years. She is a widow and lives with her son, Sabrina Eaton, and three dogs. No exposure to mold, hot tubs, or feather bedding at home.  There is no family history of lung disease or sarcoidosis. She is originally from [place]  but has lived in [place]  for many years.  Interim history: Sabrina Eaton is an 83 year old female with sarcoidosis who presents for follow-up of lung inflammation.  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, including glaucoma - Follows with two ophthalmologists for ongoing management - Currently on three eye drops for glaucoma, all containing sulfur  Medication allergies and adverse reactions - Allergic to sulfa medications, discovered during May hospitalization - Symptoms of sulfa allergy included loss of appetite, elevated blood sugar, and elevated blood pressure - Mild vomiting after eating during the reaction, without severe nausea or vomiting - Previous use of  Bactrim for diarrhea exacerbated symptoms, leading to dehydration and hospitalization  Recent hospitalizations - Hospitalized in April for  pneumonia - Hospitalized in May for gastroenteritis and adverse reaction to sulfa medications  Functional status - Returned to work at an [employer] - Currently feels well  Relevant pulmonary history Pets: Dogs Occupation: Worked in [employer].  Was previously a chartered loss adjuster Exposures: No mold, hot tub, Jacuzzi.  No feather pillows or comforter No h/o chemo/XRT/amiodarone/macrodantin/MTX  No exposure to asbestos, silica or other organic allergens  Smoking history: 60-pack-year smoker.  Quit in December 2024 Travel history: Originally from [place] .  No significant recent travel Relevant family history: No family history of lung disease  Outpatient Encounter Medications as of 12/21/2023  Medication Sig   acetaminophen  (TYLENOL ) 500 MG tablet Take 500 mg by mouth every 6 (six) hours as needed for mild pain.   amLODipine  (NORVASC ) 5 MG tablet Take 1 tablet (5 mg total) by mouth daily.   celecoxib  (CELEBREX ) 200 MG capsule Take 1 capsule by mouth daily.   citalopram  (CELEXA ) 20 MG tablet Take 20 mg by mouth daily.   dorzolamide  (TRUSOPT ) 2 % ophthalmic solution Place 1 drop into the left eye 2 (two) times daily.   doxylamine , Sleep, (UNISOM ) 25 MG tablet Take 25 mg by mouth at bedtime.   ezetimibe -simvastatin  (VYTORIN ) 10-40 MG tablet Take 1 tablet by mouth daily.   fiber supplement, BANATROL TF, liquid Take 60 mLs by mouth 2 (two) times daily.   metFORMIN  (GLUCOPHAGE ) 500 MG tablet Take 500 mg by mouth in the morning, at noon, and at bedtime.   metoprolol  tartrate (LOPRESSOR ) 25 MG tablet Take 1 tablet (25 mg total) by mouth 2 (two) times daily.   niacin  (NIASPAN ) 1000 MG CR tablet Take 1,000 mg by mouth daily.   Omega-3 Fatty Acids (FISH OIL) 1000 MG CAPS Take 1 capsule by mouth in the morning, at noon, and at bedtime.   Polyethyl Glycol-Propyl Glycol (SYSTANE HYDRATION PF OP) Place 4 drops into both eyes every evening.   prednisoLONE  acetate (PRED FORTE ) 1 % ophthalmic  suspension INSTILL 1 DROP IN THE LEFT EYE EVERY OTHER DAY   timolol  (BETIMOL ) 0.5 % ophthalmic solution Place 1 drop into both eyes 2 (two) times daily.   amoxicillin -clavulanate (AUGMENTIN ) 875-125 MG tablet Take 1 tablet by mouth 2 (two) times daily.   loperamide  (IMODIUM ) 2 MG capsule Take 1 capsule (2 mg total) by mouth as needed for diarrhea or loose stools. (Patient not taking: Reported on 12/21/2023)   No facility-administered encounter medications on file as of 12/21/2023.   Vitals:   12/21/23 1040 12/21/23 1137  BP: (!) 160/90 132/80  Pulse: 66   Temp: 98.8 F (37.1 C)   Height: 5' (1.524 m)   Weight: 101 lb (45.8 kg)   SpO2: 98%   TempSrc: Oral   BMI (Calculated): 19.73     Physical Exam GEN: No acute distress. CV: Regular rate and rhythm, no murmurs. LUNGS: Clear to auscultation bilaterally, normal respiratory effort. SKIN JOINTS: Warm and dry, no rash.    Data Reviewed: Imaging: CT chest 08/02/2023-scattered groundglass opacities bilaterally, patchy airspace disease in the lower lobes.  3 mm left upper lobe nodule.  4 cm dilatation of ascending aorta.   High-resolution CT 11/18/2023 interval increase in heterogeneous ground glass opacities through right lung, bandlike scarring, aortic atherosclerosis  I reviewed the images personally.  PFTs: 12/21/2023 FVC 2.09 [97%], FEV1 1.35 (85%], F/F65, TLC 7.76 [174%], DLCO 9.20 [56%] Minimal obstruction, overinflation,  air trapping and bronchodilator response Moderate diffusion defect  Labs:  Assessment & Plan Sarcoidosis of eye and sinuses Sarcoidosis initially diagnosed in 1998 in the left eye, with subsequent sinus involvement. No pulmonary involvement on recent CT. Managed with prednisone eye drops, effectively controlling ocular symptoms. Chronic sinusitis managed with nasal flushes and topical treatments. No systemic steroid use. Unlikely future lung involvement given 30-year history without pulmonary symptoms. -  Continue prednisone eye drops for ocular sarcoidosis - Continue nasal flushes and topical treatments for sinus involvement - Follow-up high-res CT and PFTs to get baseline lung evaluation  Pneumonia Recent hospitalization for community-acquired pneumonia with mild presentation, treated with ceftriaxone  and azithromycin . CT scan showed mild pneumonia without cancer or significant lung involvement. Symptoms resolved post-treatment. Future lung evaluation planned due to smoking history and potential COPD risk. - Order follow-up chest CT in 1-2 months to ensure resolution of pneumonia - Lung function tests to evaluate for potential COPD due to smoking history  - Consider further liver evaluation for sarcoidosis involvement if liver function tests remain abnormal    Pulmonary inflammation post-pneumonia CT scan revealed inflammation in the lower lung, likely due to mild pneumonia. Augmentin  was prescribed for seven days.  CT scan findings are not consistent with sarcoidosis or cancer. Aspiration may contribute to inflammation due to previous nausea and vomiting. Allergy to sulfa drugs identified, possibly contributing to gastrointestinal symptoms and subsequent aspiration. - Order follow-up chest CT in six months to assess resolution of inflammation  Sarcoidosis with ocular and sinus involvement Diagnosed in 1998 with ocular involvement in the left eye, now involving the sinuses. Managed with eye drops and nasal flushes. No lung involvement. Follow-up with two eye doctors due to complications from long-term steroid use, including glaucoma. Eye drops contain sulfa, which may need to be addressed with her eye doctor. - Continue current management with eye drops and nasal flushes - Discuss sulfa content in eye drops with eye doctor  Chronic sinusitis Managed with nasal flushes twice daily. - Continue nasal flushes twice daily  Possible asthma Lung function test shows reduced lung capacity,  possibly consistent with asthma. No current symptoms of wheezing or shortness of breath. Inhaler prescribed post-hospitalization, now for emergency use only. - Maintain inhaler for emergency use only  Abnormal liver function tests Abnormal liver function tests during recent hospitalization, likely due to dehydration and infection. Improvement with rehydration. No active sarcoidosis liver involvement suspected.  Follow-up LFTs are normalized.   Recommendations: Follow-up CT scan   Lonna Coder MD [facility] 12/21/2023, 11:05 AM  CC: Cerena Baine, MD    "

## 2023-12-28 DIAGNOSIS — E119 Type 2 diabetes mellitus without complications: Secondary | ICD-10-CM | POA: Diagnosis not present

## 2024-01-04 ENCOUNTER — Ambulatory Visit (INDEPENDENT_AMBULATORY_CARE_PROVIDER_SITE_OTHER): Admitting: Family Medicine

## 2024-01-04 ENCOUNTER — Encounter (HOSPITAL_BASED_OUTPATIENT_CLINIC_OR_DEPARTMENT_OTHER): Payer: Self-pay | Admitting: Family Medicine

## 2024-01-04 VITALS — BP 158/96 | HR 66 | Ht 60.0 in | Wt 103.1 lb

## 2024-01-04 DIAGNOSIS — I10 Essential (primary) hypertension: Secondary | ICD-10-CM | POA: Diagnosis not present

## 2024-01-04 DIAGNOSIS — D649 Anemia, unspecified: Secondary | ICD-10-CM

## 2024-01-04 DIAGNOSIS — E1165 Type 2 diabetes mellitus with hyperglycemia: Secondary | ICD-10-CM | POA: Diagnosis not present

## 2024-01-04 NOTE — Patient Instructions (Signed)
  Medication Instructions:  Your physician recommends that you continue on your current medications as directed. Please refer to the Current Medication list given to you today. --If you need a refill on any your medications before your next appointment, please call your pharmacy first. If no refills are authorized on file call the office.-- Lab Work: Your physician has recommended that you have lab work today: today  If you have labs (blood work) drawn today and your tests are completely normal, you will receive your results via MyChart message OR a phone call from our staff.  Please ensure you check your voicemail in the event that you authorized detailed messages to be left on a delegated number. If you have any lab test that is abnormal or we need to change your treatment, we will call you to review the results.    Follow-Up: Your next appointment:   Your physician recommends that you schedule a follow-up appointment in: 3-4 months follow up with Dr. de [place]  You will receive a text message or e-mail with a link to a survey about your care and experience with [place] today! We would greatly appreciate your feedback!   Thanks for letting us be apart of your health journey!!  Primary Care and Sports Medicine   Dr. Ceasar Mons [place]   We encourage you to activate your patient portal called "MyChart".  Sign up information is provided on this After Visit Summary.  MyChart is used to connect with patients for Virtual Visits (Telemedicine).  Patients are able to view lab/test results, encounter notes, upcoming appointments, etc.  Non-urgent messages can be sent to your provider as well. To learn more about what you can do with MyChart, please visit --  [email or web address].

## 2024-01-04 NOTE — Assessment & Plan Note (Signed)
 Prior A1c showed good control.  Due for recheck today.  Tolerating metformin, can continue with medication regimen.  Will discuss any changes needed based upon lab results. Today we will check A1c as well as repeat urine microalbumin/creatinine ratio as this was moderately increased previously. Plan to complete foot exam at future office visit

## 2024-01-04 NOTE — Assessment & Plan Note (Signed)
 Blood pressure elevated in office today, currently doing well with current medication regimen, denies any issues.  Blood pressure did improve on recheck.  She does report that home readings have been better controlled, notes that she will have higher blood pressure readings in office.  Recommend intermittent monitoring of blood pressure at home, DASH diet.  Will continue with current medication regimen, no changes today

## 2024-01-04 NOTE — Assessment & Plan Note (Signed)
 Observed on recent labs with mild degree of macrocytosis.  She has been on metformin for a number of years and with macrocytosis, we can check B12 to rule out B12 deficiency.  We will also recheck CBC for monitoring.

## 2024-01-04 NOTE — Progress Notes (Signed)
    Procedures performed today:    None.  Independent interpretation of notes and tests performed by another provider:   None.  Brief History, Exam, Impression, and Recommendations:    BP (!) 158/96 (BP Location: Left Arm, Patient Position: Sitting, Cuff Size: Small)   Pulse 66   Ht 5' (1.524 m)   Wt 103 lb 1.6 oz (46.8 kg)   SpO2 100%   BMI 20.14 kg/m   Type 2 diabetes mellitus with hyperglycemia, without long-term current use of insulin  (HCC) Assessment & Plan: Prior A1c showed good control.  Due for recheck today.  Tolerating metformin, can continue with medication regimen.  Will discuss any changes needed based upon lab results. Today we will check A1c as well as repeat urine microalbumin/creatinine ratio as this was moderately increased previously. Plan to complete foot exam at future office visit  Orders: -     Vitamin B12 -     Hemoglobin A1c -     Microalbumin / creatinine urine ratio  Anemia, unspecified type Assessment & Plan: Observed on recent labs with mild degree of macrocytosis.  She has been on metformin for a number of years and with macrocytosis, we can check B12 to rule out B12 deficiency.  We will also recheck CBC for monitoring.  Orders: -     CBC with Differential/Platelet -     Vitamin B12  Essential hypertension Assessment & Plan: Blood pressure elevated in office today, currently doing well with current medication regimen, denies any issues.  Blood pressure did improve on recheck.  She does report that home readings have been better controlled, notes that she will have higher blood pressure readings in office.  Recommend intermittent monitoring of blood pressure at home, DASH diet.  Will continue with current medication regimen, no changes today   Return in about 3 months (around 04/04/2024) for hypertension, diabetes.   ___________________________________________ Briscoe Daniello de [place], MD, ABFM, CAQSM Primary Care and Sports Medicine [facility]

## 2024-01-05 LAB — MICROALBUMIN / CREATININE URINE RATIO
Creatinine, Urine: 29.8 mg/dL
Microalb/Creat Ratio: 886 mg/g{creat} — ABNORMAL HIGH (ref 0–29)
Microalbumin, Urine: 263.9 ug/mL

## 2024-01-05 LAB — CBC WITH DIFFERENTIAL/PLATELET
Basophils Absolute: 0.1 x10E3/uL (ref 0.0–0.2)
Basos: 1 %
EOS (ABSOLUTE): 0.2 x10E3/uL (ref 0.0–0.4)
Eos: 2 %
Hematocrit: 42.9 % (ref 34.0–46.6)
Hemoglobin: 13.4 g/dL (ref 11.1–15.9)
Immature Grans (Abs): 0 x10E3/uL (ref 0.0–0.1)
Immature Granulocytes: 0 %
Lymphocytes Absolute: 1.4 x10E3/uL (ref 0.7–3.1)
Lymphs: 16 %
MCH: 30.4 pg (ref 26.6–33.0)
MCHC: 31.2 g/dL — ABNORMAL LOW (ref 31.5–35.7)
MCV: 97 fL (ref 79–97)
Monocytes Absolute: 1.1 x10E3/uL — ABNORMAL HIGH (ref 0.1–0.9)
Monocytes: 12 %
Neutrophils Absolute: 6.5 x10E3/uL (ref 1.4–7.0)
Neutrophils: 69 %
Platelets: 247 x10E3/uL (ref 150–450)
RBC: 4.41 x10E6/uL (ref 3.77–5.28)
RDW: 12 % (ref 11.7–15.4)
WBC: 9.3 x10E3/uL (ref 3.4–10.8)

## 2024-01-05 LAB — HEMOGLOBIN A1C
Est. average glucose Bld gHb Est-mCnc: 111 mg/dL
Hgb A1c MFr Bld: 5.5 % (ref 4.8–5.6)

## 2024-01-05 LAB — VITAMIN B12: Vitamin B-12: 1911 pg/mL — ABNORMAL HIGH (ref 232–1245)

## 2024-01-06 ENCOUNTER — Ambulatory Visit (HOSPITAL_BASED_OUTPATIENT_CLINIC_OR_DEPARTMENT_OTHER): Payer: Self-pay | Admitting: Family Medicine

## 2024-01-10 ENCOUNTER — Encounter (HOSPITAL_BASED_OUTPATIENT_CLINIC_OR_DEPARTMENT_OTHER): Payer: Self-pay | Admitting: Family Medicine

## 2024-01-11 ENCOUNTER — Other Ambulatory Visit (HOSPITAL_BASED_OUTPATIENT_CLINIC_OR_DEPARTMENT_OTHER): Payer: Self-pay | Admitting: *Deleted

## 2024-01-11 DIAGNOSIS — H4052X3 Glaucoma secondary to other eye disorders, left eye, severe stage: Secondary | ICD-10-CM | POA: Diagnosis not present

## 2024-01-11 DIAGNOSIS — H401111 Primary open-angle glaucoma, right eye, mild stage: Secondary | ICD-10-CM | POA: Diagnosis not present

## 2024-01-11 MED ORDER — CITALOPRAM HYDROBROMIDE 20 MG PO TABS: 20.0000 mg | ORAL_TABLET | Freq: Every day | ORAL | 1 refills | Status: AC

## 2024-01-11 MED ORDER — CELECOXIB 200 MG PO CAPS: 200.0000 mg | ORAL_CAPSULE | Freq: Every day | ORAL | 1 refills | Status: DC

## 2024-01-11 MED ORDER — EZETIMIBE-SIMVASTATIN 10-40 MG PO TABS: 1.0000 | ORAL_TABLET | Freq: Every day | ORAL | 1 refills | Status: AC

## 2024-01-11 MED ORDER — METFORMIN HCL 500 MG PO TABS: 500.0000 mg | ORAL_TABLET | Freq: Three times a day (TID) | ORAL | 1 refills | Status: AC

## 2024-01-11 MED ORDER — NIACIN ER (ANTIHYPERLIPIDEMIC) 1000 MG PO TBCR: 1000.0000 mg | EXTENDED_RELEASE_TABLET | Freq: Every day | ORAL | 1 refills | Status: DC

## 2024-01-11 MED ORDER — CELECOXIB 200 MG PO CAPS: 200.0000 mg | ORAL_CAPSULE | Freq: Every day | ORAL | 1 refills | Status: AC

## 2024-01-11 MED ORDER — EZETIMIBE-SIMVASTATIN 10-40 MG PO TABS: 1.0000 | ORAL_TABLET | Freq: Every day | ORAL | 1 refills | Status: DC

## 2024-01-11 MED ORDER — CITALOPRAM HYDROBROMIDE 20 MG PO TABS: 20.0000 mg | ORAL_TABLET | Freq: Every day | ORAL | 1 refills | Status: DC

## 2024-01-11 MED ORDER — METFORMIN HCL 500 MG PO TABS: 500.0000 mg | ORAL_TABLET | Freq: Three times a day (TID) | ORAL | 1 refills | Status: DC

## 2024-01-11 MED ORDER — NIACIN ER (ANTIHYPERLIPIDEMIC) 1000 MG PO TBCR: 1000.0000 mg | EXTENDED_RELEASE_TABLET | Freq: Every day | ORAL | 1 refills | Status: AC

## 2024-01-25 DIAGNOSIS — H4052X3 Glaucoma secondary to other eye disorders, left eye, severe stage: Secondary | ICD-10-CM | POA: Diagnosis not present

## 2024-01-25 DIAGNOSIS — H401111 Primary open-angle glaucoma, right eye, mild stage: Secondary | ICD-10-CM | POA: Diagnosis not present

## 2024-01-27 DIAGNOSIS — E119 Type 2 diabetes mellitus without complications: Secondary | ICD-10-CM | POA: Diagnosis not present

## 2024-02-27 DIAGNOSIS — E119 Type 2 diabetes mellitus without complications: Secondary | ICD-10-CM | POA: Diagnosis not present

## 2024-03-07 ENCOUNTER — Encounter (HOSPITAL_BASED_OUTPATIENT_CLINIC_OR_DEPARTMENT_OTHER): Payer: Self-pay | Admitting: Family Medicine

## 2024-03-08 MED ORDER — AMLODIPINE BESYLATE 5 MG PO TABS: 5.0000 mg | ORAL_TABLET | Freq: Every day | ORAL | 1 refills | Status: AC

## 2024-03-08 MED ORDER — METOPROLOL TARTRATE 25 MG PO TABS: 25.0000 mg | ORAL_TABLET | Freq: Two times a day (BID) | ORAL | 1 refills | Status: AC

## 2024-04-05 ENCOUNTER — Ambulatory Visit (HOSPITAL_BASED_OUTPATIENT_CLINIC_OR_DEPARTMENT_OTHER): Admitting: Family Medicine

## 2024-04-14 ENCOUNTER — Ambulatory Visit (INDEPENDENT_AMBULATORY_CARE_PROVIDER_SITE_OTHER): Admitting: Family Medicine

## 2024-04-14 VITALS — BP 173/81 | HR 66 | Temp 98.6°F | Resp 18 | Ht 60.0 in | Wt 105.0 lb

## 2024-04-14 DIAGNOSIS — R7989 Other specified abnormal findings of blood chemistry: Secondary | ICD-10-CM

## 2024-04-14 DIAGNOSIS — E1122 Type 2 diabetes mellitus with diabetic chronic kidney disease: Secondary | ICD-10-CM

## 2024-04-14 DIAGNOSIS — I10 Essential (primary) hypertension: Secondary | ICD-10-CM

## 2024-04-14 DIAGNOSIS — N1831 Chronic kidney disease, stage 3a: Secondary | ICD-10-CM

## 2024-04-14 DIAGNOSIS — E785 Hyperlipidemia, unspecified: Secondary | ICD-10-CM

## 2024-04-14 NOTE — Assessment & Plan Note (Signed)
 Prior A1c showed good control. Tolerating metformin , can continue with medication regimen.  Will discuss any changes needed based upon lab results. Will plan to recheck A1c as well as urine ACR before next appointment Foot exam completed today, documented in chart Given associated CKD and albuminuria, could consider initiating ACE inhibitor or ARB.  We discussed options today.  Can hold off for now, but we will consider this in the future.

## 2024-04-14 NOTE — Progress Notes (Signed)
° ° °  Procedures performed today:    None.  Independent interpretation of notes and tests performed by another provider:   None.  Brief History, Exam, Impression, and Recommendations:    BP (!) 173/81 (BP Location: Left Arm, Patient Position: Sitting, Cuff Size: Normal)   Pulse 66   Temp 98.6 F (37 C) (Oral)   Resp 18   Ht 5' (1.524 m)   Wt 105 lb (47.6 kg)   SpO2 100%   BMI 20.51 kg/m   Type 2 diabetes mellitus with stage 3a chronic kidney disease, without long-term current use of insulin  (HCC) Assessment & Plan: Prior A1c showed good control. Tolerating metformin , can continue with medication regimen.  Will discuss any changes needed based upon lab results. Will plan to recheck A1c as well as urine ACR before next appointment Foot exam completed today, documented in chart Given associated CKD and albuminuria, could consider initiating ACE inhibitor or ARB.  We discussed options today.  Can hold off for now, but we will consider this in the future.  Orders: -     Hemoglobin A1c -     Lipid panel -     Basic metabolic panel with GFR -     Microalbumin / creatinine urine ratio  Essential hypertension Assessment & Plan: Blood pressure elevated in office today, currently doing well with amlodipine , denies any issues.  Blood pressure did improve on recheck.  She does report that home readings have been better controlled, notes that she will have higher blood pressure readings in office. Recommend intermittent monitoring of blood pressure at home, DASH diet.  Will continue with current medication regimen, no changes today Discussed considerations for possible medication adjustment, particularly related to use of ACE inhibitor or ARB in conjunction with underlying increased albuminuria Advised on bringing blood pressure log to next appointment and bring home blood pressure cuff for comparison to our cuff in office  Orders: -     CBC with Differential/Platelet -     Lipid  panel -     Basic metabolic panel with GFR  Hyperlipidemia, unspecified hyperlipidemia type Assessment & Plan: Currently tolerating ezetimibe -simvastatin , can continue with medication.  Will plan to monitor lipid panel in the future  Orders: -     Lipid panel  Elevated vitamin B12 level -     Vitamin B12  Return in about 3 months (around 07/13/2024).  Spent 32 minutes on this patient encounter, including preparation, chart review, face-to-face counseling with patient and coordination of care, and documentation of encounter   ___________________________________________ Hampton Wixom de Cuba, MD, ABFM, [facility] Primary Care and Sports Medicine [facility]

## 2024-04-14 NOTE — Assessment & Plan Note (Signed)
 Currently tolerating ezetimibe -simvastatin , can continue with medication.  Will plan to monitor lipid panel in the future

## 2024-04-14 NOTE — Assessment & Plan Note (Signed)
 Blood pressure elevated in office today, currently doing well with amlodipine , denies any issues.  Blood pressure did improve on recheck.  She does report that home readings have been better controlled, notes that she will have higher blood pressure readings in office. Recommend intermittent monitoring of blood pressure at home, DASH diet.  Will continue with current medication regimen, no changes today Discussed considerations for possible medication adjustment, particularly related to use of ACE inhibitor or ARB in conjunction with underlying increased albuminuria Advised on bringing blood pressure log to next appointment and bring home blood pressure cuff for comparison to our cuff in office

## 2024-05-18 ENCOUNTER — Encounter (HOSPITAL_BASED_OUTPATIENT_CLINIC_OR_DEPARTMENT_OTHER): Payer: Self-pay

## 2024-05-24 ENCOUNTER — Encounter: Payer: Self-pay | Admitting: Pulmonary Disease

## 2024-06-22 ENCOUNTER — Other Ambulatory Visit

## 2024-07-13 ENCOUNTER — Ambulatory Visit (HOSPITAL_BASED_OUTPATIENT_CLINIC_OR_DEPARTMENT_OTHER): Admitting: Family Medicine

## 2024-07-13 ENCOUNTER — Encounter (INDEPENDENT_AMBULATORY_CARE_PROVIDER_SITE_OTHER): Admitting: Ophthalmology

## 2024-07-14 ENCOUNTER — Ambulatory Visit (HOSPITAL_BASED_OUTPATIENT_CLINIC_OR_DEPARTMENT_OTHER): Admitting: Family Medicine
